# Patient Record
Sex: Female | Born: 1935 | Race: White | Hispanic: No | Smoking: Never smoker
Health system: Southern US, Community
[De-identification: ages and names within clinical notes are randomized; demographics above are authoritative.]

## PROBLEM LIST (undated history)

## (undated) DIAGNOSIS — H409 Unspecified glaucoma: Secondary | ICD-10-CM

## (undated) DIAGNOSIS — K5900 Constipation, unspecified: Secondary | ICD-10-CM

## (undated) DIAGNOSIS — E785 Hyperlipidemia, unspecified: Secondary | ICD-10-CM

## (undated) DIAGNOSIS — I639 Cerebral infarction, unspecified: Secondary | ICD-10-CM

## (undated) DIAGNOSIS — Z8679 Personal history of other diseases of the circulatory system: Secondary | ICD-10-CM

## (undated) DIAGNOSIS — I10 Essential (primary) hypertension: Secondary | ICD-10-CM

## (undated) DIAGNOSIS — K219 Gastro-esophageal reflux disease without esophagitis: Secondary | ICD-10-CM

## (undated) DIAGNOSIS — D649 Anemia, unspecified: Secondary | ICD-10-CM

## (undated) DIAGNOSIS — N390 Urinary tract infection, site not specified: Secondary | ICD-10-CM

## (undated) DIAGNOSIS — R42 Dizziness and giddiness: Secondary | ICD-10-CM

## (undated) HISTORY — DX: Unspecified glaucoma: H40.9

## (undated) HISTORY — DX: Essential (primary) hypertension: I10

## (undated) HISTORY — PX: OTHER SURGICAL HISTORY: SHX169

---

## 2018-01-06 ENCOUNTER — Other Ambulatory Visit: Payer: Self-pay

## 2018-01-06 ENCOUNTER — Emergency Department (HOSPITAL_COMMUNITY): Payer: Medicare Other

## 2018-01-06 ENCOUNTER — Encounter (HOSPITAL_COMMUNITY): Payer: Self-pay | Admitting: Emergency Medicine

## 2018-01-06 ENCOUNTER — Observation Stay (HOSPITAL_COMMUNITY)
Admission: EM | Admit: 2018-01-06 | Discharge: 2018-01-08 | Disposition: A | Discharge status: Home / Self Care | Payer: Medicare Other | Attending: Family Medicine | Admitting: Family Medicine

## 2018-01-06 DIAGNOSIS — Z66 Do not resuscitate: Secondary | ICD-10-CM | POA: Diagnosis not present

## 2018-01-06 DIAGNOSIS — K819 Cholecystitis, unspecified: Secondary | ICD-10-CM | POA: Diagnosis not present

## 2018-01-06 DIAGNOSIS — R339 Retention of urine, unspecified: Secondary | ICD-10-CM | POA: Diagnosis not present

## 2018-01-06 DIAGNOSIS — K811 Chronic cholecystitis: Secondary | ICD-10-CM | POA: Diagnosis not present

## 2018-01-06 DIAGNOSIS — K219 Gastro-esophageal reflux disease without esophagitis: Secondary | ICD-10-CM | POA: Insufficient documentation

## 2018-01-06 DIAGNOSIS — E785 Hyperlipidemia, unspecified: Secondary | ICD-10-CM | POA: Insufficient documentation

## 2018-01-06 DIAGNOSIS — Z87440 Personal history of urinary (tract) infections: Secondary | ICD-10-CM | POA: Insufficient documentation

## 2018-01-06 DIAGNOSIS — K8010 Calculus of gallbladder with chronic cholecystitis without obstruction: Principal | ICD-10-CM | POA: Insufficient documentation

## 2018-01-06 DIAGNOSIS — K5909 Other constipation: Secondary | ICD-10-CM | POA: Diagnosis not present

## 2018-01-06 DIAGNOSIS — R1011 Right upper quadrant pain: Secondary | ICD-10-CM | POA: Diagnosis not present

## 2018-01-06 DIAGNOSIS — I10 Essential (primary) hypertension: Secondary | ICD-10-CM | POA: Insufficient documentation

## 2018-01-06 DIAGNOSIS — E7800 Pure hypercholesterolemia, unspecified: Secondary | ICD-10-CM | POA: Diagnosis not present

## 2018-01-06 DIAGNOSIS — Z79899 Other long term (current) drug therapy: Secondary | ICD-10-CM | POA: Insufficient documentation

## 2018-01-06 DIAGNOSIS — E860 Dehydration: Secondary | ICD-10-CM | POA: Insufficient documentation

## 2018-01-06 DIAGNOSIS — F0390 Unspecified dementia without behavioral disturbance: Secondary | ICD-10-CM | POA: Diagnosis not present

## 2018-01-06 DIAGNOSIS — R42 Dizziness and giddiness: Secondary | ICD-10-CM | POA: Insufficient documentation

## 2018-01-06 DIAGNOSIS — R1084 Generalized abdominal pain: Secondary | ICD-10-CM | POA: Diagnosis not present

## 2018-01-06 DIAGNOSIS — I6523 Occlusion and stenosis of bilateral carotid arteries: Secondary | ICD-10-CM | POA: Diagnosis not present

## 2018-01-06 DIAGNOSIS — R109 Unspecified abdominal pain: Secondary | ICD-10-CM

## 2018-01-06 DIAGNOSIS — K5904 Chronic idiopathic constipation: Secondary | ICD-10-CM | POA: Diagnosis not present

## 2018-01-06 DIAGNOSIS — E559 Vitamin D deficiency, unspecified: Secondary | ICD-10-CM | POA: Diagnosis not present

## 2018-01-06 DIAGNOSIS — K5900 Constipation, unspecified: Secondary | ICD-10-CM

## 2018-01-06 DIAGNOSIS — Z7982 Long term (current) use of aspirin: Secondary | ICD-10-CM | POA: Diagnosis not present

## 2018-01-06 DIAGNOSIS — Z87898 Personal history of other specified conditions: Secondary | ICD-10-CM

## 2018-01-06 HISTORY — DX: Hyperlipidemia, unspecified: E78.5

## 2018-01-06 HISTORY — DX: Personal history of other diseases of the circulatory system: Z86.79

## 2018-01-06 HISTORY — DX: Urinary tract infection, site not specified: N39.0

## 2018-01-06 HISTORY — DX: Gastro-esophageal reflux disease without esophagitis: K21.9

## 2018-01-06 HISTORY — DX: Dizziness and giddiness: R42

## 2018-01-06 HISTORY — DX: Constipation, unspecified: K59.00

## 2018-01-06 LAB — CBC WITH DIFFERENTIAL/PLATELET
Basophils Absolute: 0 10*3/uL (ref 0.0–0.1)
Basophils Relative: 0 %
EOS ABS: 0.5 10*3/uL (ref 0.0–0.7)
EOS PCT: 5 %
HCT: 42.7 % (ref 36.0–46.0)
Hemoglobin: 13.5 g/dL (ref 12.0–15.0)
LYMPHS ABS: 1.8 10*3/uL (ref 0.7–4.0)
Lymphocytes Relative: 22 %
MCH: 28.4 pg (ref 26.0–34.0)
MCHC: 31.6 g/dL (ref 30.0–36.0)
MCV: 89.7 fL (ref 78.0–100.0)
MONO ABS: 1.2 10*3/uL — AB (ref 0.1–1.0)
MONOS PCT: 14 %
Neutro Abs: 4.9 10*3/uL (ref 1.7–7.7)
Neutrophils Relative %: 59 %
PLATELETS: 232 10*3/uL (ref 150–400)
RBC: 4.76 MIL/uL (ref 3.87–5.11)
RDW: 16.5 % — ABNORMAL HIGH (ref 11.5–15.5)
WBC: 8.3 10*3/uL (ref 4.0–10.5)

## 2018-01-06 LAB — COMPREHENSIVE METABOLIC PANEL
ALT: 12 U/L — ABNORMAL LOW (ref 14–54)
AST: 25 U/L (ref 15–41)
Albumin: 3.7 g/dL (ref 3.5–5.0)
Alkaline Phosphatase: 83 U/L (ref 38–126)
Anion gap: 10 (ref 5–15)
BUN: 20 mg/dL (ref 6–20)
CHLORIDE: 104 mmol/L (ref 101–111)
CO2: 27 mmol/L (ref 22–32)
Calcium: 9.3 mg/dL (ref 8.9–10.3)
Creatinine, Ser: 1.53 mg/dL — ABNORMAL HIGH (ref 0.44–1.00)
GFR, EST AFRICAN AMERICAN: 32 mL/min — AB (ref >60)
GFR, EST NON AFRICAN AMERICAN: 28 mL/min — AB (ref >60)
Glucose, Bld: 99 mg/dL (ref 65–99)
POTASSIUM: 4.3 mmol/L (ref 3.5–5.1)
SODIUM: 141 mmol/L (ref 135–145)
Total Bilirubin: 0.4 mg/dL (ref 0.3–1.2)
Total Protein: 7.2 g/dL (ref 6.5–8.1)

## 2018-01-06 LAB — CBC
HEMATOCRIT: 44.5 % (ref 36.0–46.0)
HEMOGLOBIN: 13.7 g/dL (ref 12.0–15.0)
MCH: 28.2 pg (ref 26.0–34.0)
MCHC: 30.8 g/dL (ref 30.0–36.0)
MCV: 91.8 fL (ref 78.0–100.0)
Platelets: 228 10*3/uL (ref 150–400)
RBC: 4.85 MIL/uL (ref 3.87–5.11)
RDW: 16.7 % — ABNORMAL HIGH (ref 11.5–15.5)
WBC: 7.3 10*3/uL (ref 4.0–10.5)

## 2018-01-06 LAB — URINALYSIS, ROUTINE W REFLEX MICROSCOPIC
Bilirubin Urine: NEGATIVE
Glucose, UA: NEGATIVE mg/dL
Ketones, ur: NEGATIVE mg/dL
NITRITE: NEGATIVE
PH: 6 (ref 5.0–8.0)
Protein, ur: 30 mg/dL — AB
SPECIFIC GRAVITY, URINE: 1.014 (ref 1.005–1.030)

## 2018-01-06 LAB — CREATININE, SERUM
Creatinine, Ser: 1.34 mg/dL — ABNORMAL HIGH (ref 0.44–1.00)
GFR calc Af Amer: 38 mL/min — ABNORMAL LOW (ref >60)
GFR calc non Af Amer: 33 mL/min — ABNORMAL LOW (ref >60)

## 2018-01-06 LAB — LIPASE, BLOOD: LIPASE: 24 U/L (ref 11–51)

## 2018-01-06 MED ORDER — ACETAMINOPHEN 650 MG RE SUPP: 650.0000 mg | Freq: Four times a day (QID) | RECTAL | Status: DC | PRN

## 2018-01-06 MED ORDER — PHENOL 1.4 % MT LIQD: 1.0000 | OROMUCOSAL | Status: DC | PRN

## 2018-01-06 MED ORDER — LORATADINE 10 MG PO TABS: 10.0000 mg | ORAL_TABLET | Freq: Every day | ORAL | Status: DC | PRN

## 2018-01-06 MED ORDER — LIP MEDEX EX OINT: 1.0000 "application " | TOPICAL_OINTMENT | CUTANEOUS | Status: DC | PRN

## 2018-01-06 MED ORDER — PANTOPRAZOLE SODIUM 40 MG PO TBEC
40.0000 mg | DELAYED_RELEASE_TABLET | Freq: Every day | ORAL | Status: DC
  Administered 2018-01-07 – 2018-01-08 (×2): 40 mg via ORAL
  Filled 2018-01-06 (×2): qty 1

## 2018-01-06 MED ORDER — MUSCLE RUB 10-15 % EX CREA: 1.0000 "application " | TOPICAL_CREAM | CUTANEOUS | Status: DC | PRN

## 2018-01-06 MED ORDER — ALUM & MAG HYDROXIDE-SIMETH 200-200-20 MG/5ML PO SUSP: 30.0000 mL | ORAL | Status: DC | PRN

## 2018-01-06 MED ORDER — SODIUM CHLORIDE 0.9 % IV SOLN: INTRAVENOUS | Status: DC

## 2018-01-06 MED ORDER — POLYVINYL ALCOHOL 1.4 % OP SOLN: 1.0000 [drp] | OPHTHALMIC | Status: DC | PRN

## 2018-01-06 MED ORDER — HYDRALAZINE HCL 20 MG/ML IJ SOLN
10.0000 mg | INTRAMUSCULAR | Status: DC | PRN
  Administered 2018-01-06: 10 mg via INTRAVENOUS
  Filled 2018-01-06: qty 1

## 2018-01-06 MED ORDER — SODIUM CHLORIDE 0.9 % IV SOLN
INTRAVENOUS | Status: DC
  Administered 2018-01-06 – 2018-01-07 (×2): via INTRAVENOUS

## 2018-01-06 MED ORDER — SALINE SPRAY 0.65 % NA SOLN: 1.0000 | NASAL | Status: DC | PRN

## 2018-01-06 MED ORDER — SENNOSIDES-DOCUSATE SODIUM 8.6-50 MG PO TABS: 1.0000 | ORAL_TABLET | Freq: Every evening | ORAL | Status: DC | PRN

## 2018-01-06 MED ORDER — HYDROCORTISONE 2.5 % RE CREA: 1.0000 "application " | TOPICAL_CREAM | Freq: Four times a day (QID) | RECTAL | Status: DC | PRN

## 2018-01-06 MED ORDER — LABETALOL HCL 5 MG/ML IV SOLN
10.0000 mg | INTRAVENOUS | Status: DC | PRN
  Filled 2018-01-06: qty 4

## 2018-01-06 MED ORDER — DOCUSATE SODIUM 100 MG PO CAPS: 100.0000 mg | ORAL_CAPSULE | Freq: Two times a day (BID) | ORAL | Status: DC | PRN

## 2018-01-06 MED ORDER — ENOXAPARIN SODIUM 30 MG/0.3ML ~~LOC~~ SOLN
30.0000 mg | SUBCUTANEOUS | Status: DC
  Administered 2018-01-06 – 2018-01-07 (×2): 30 mg via SUBCUTANEOUS
  Filled 2018-01-06 (×2): qty 0.3

## 2018-01-06 MED ORDER — ROSUVASTATIN CALCIUM 10 MG PO TABS
10.0000 mg | ORAL_TABLET | Freq: Every day | ORAL | Status: DC
  Administered 2018-01-07 – 2018-01-08 (×2): 10 mg via ORAL
  Filled 2018-01-06 (×2): qty 1

## 2018-01-06 MED ORDER — ASPIRIN EC 81 MG PO TBEC
81.0000 mg | DELAYED_RELEASE_TABLET | Freq: Every day | ORAL | Status: DC
  Administered 2018-01-07 – 2018-01-08 (×2): 81 mg via ORAL
  Filled 2018-01-06 (×2): qty 1

## 2018-01-06 MED ORDER — GUAIFENESIN-DM 100-10 MG/5ML PO SYRP: 5.0000 mL | ORAL_SOLUTION | ORAL | Status: DC | PRN

## 2018-01-06 MED ORDER — ACETAMINOPHEN 325 MG PO TABS: 650.0000 mg | ORAL_TABLET | Freq: Four times a day (QID) | ORAL | Status: DC | PRN

## 2018-01-06 MED ORDER — IRBESARTAN 75 MG PO TABS
37.5000 mg | ORAL_TABLET | Freq: Every day | ORAL | Status: DC
  Administered 2018-01-06 – 2018-01-07 (×2): 37.5 mg via ORAL
  Filled 2018-01-06 (×2): qty 1

## 2018-01-06 MED ORDER — HYDROCORTISONE 1 % EX CREA: 1.0000 "application " | TOPICAL_CREAM | Freq: Three times a day (TID) | CUTANEOUS | Status: DC | PRN

## 2018-01-06 NOTE — ED Notes (Signed)
Pt has has an assigned Bed x 1.5 hours no purple icon has been made, floor  called to find out the delay.

## 2018-01-06 NOTE — ED Notes (Signed)
Called reports to [facility] 5th floor

## 2018-01-06 NOTE — ED Notes (Signed)
Patient transported to Ultrasound 

## 2018-01-06 NOTE — Consult Note (Signed)
Reason for Consult: Chronic cholecystitis Referring Physician: Dr. Robyn Haber PCP:  Suzie Portela, MD  [facility]: Dr. Dorena Dew -[phone]   Donna Summers is an 84 y.o. female.   HPI: 84 year old female who lives in assisted living outside of town with dementia.  Son reports numerous visits for UTI'S; she has a foley in still.  Two rounds of antibiotics (nitrofuantoin is the last antibiotic).   She has a history of constipation for years and was treated for this recently/SBO;  discharged on MiraLAX.  She returned with about 2 weeks of abdominal discomfort and some nausea.  She was found to have and gallstones with reported bile duct blockage. Surgeons there reported she may need and MRCP.   She lives and was in [facility], this area  has limited resources, her son has brought her here to help sort things out.    Patient reported abdominal pain for the last 11 days.  Pain appears to be intermittent, she cannot tell if it is associated with PO intake.  Her PO intake has been very limited to just a few bites per meal.  Some nausea but she varies her story so it is hard to tell.  .  She has had some fever has been on antibiotics for a low-grade UTI, foley is still in and she has a depends garment in place..    Patient's son is a friend of Dr. Olin Pia and was brought her to [facility] for evaluation and treatment.``  Workup in our ED here: She is afebrile vital signs are stable.  Creatinine is 1.53, lipase is 24, AST  25,  ALT 12,  bilirubin 0.4.  WBC is 8.3 hemoglobin 13.5, hematocrit 42.7.  Platelets 232,000. UA shows WBC TNTC, nitrates are negative.   Abdominal ultrasound shows multiple gallstones in the gallbladder the largest measuring 1.1 cm, diffuse gallbladder wall thickening maximal thickness 3.4 cm, no pericholecystic fluid no sonographic Murphy sign.  Common bile duct is 6.9 mm.  Consistent with cholelithiasis and diffuse gallbladder wall thickening  is most likely due to chronic cholecystitis.  No biliary area ductal dilatation;  diffuse bilateral renal cortical atrophy.  We are asked to see. Chest x-ray shows no acute pulmonary disease.  No evidence of bowel dilatation or free intraperitoneal air.  Past Medical History:  Diagnosis Date  . Total carotid stenosis with TIA ? Left; severe right carotid disease   . Dementia - Assisted living - still walks some in the home   . GERD (gastroesophageal reflux disease)   . constipation   . Hyperlipidemia   . UTI (urinary tract infection)   . Vertigo    Her son reports prior hysterectomy - ? 40 years ago With SBO 35 years ago reexploration and Lysis of adhesions.  I cannot currently get into Care Everywhere so I cannot add more hx  The histories are not reviewed yet. Please review them in the "History" navigator section and refresh this [place].  No family history on file.  Social History:  reports that she has never smoked. She does not have any smokeless tobacco history on file. She reports that she drank alcohol. Her drug history is not on file.   Tobacco:  None ETOH:  None DRUGS: none Lives in assisted living with some independent ambulation.   Allergies:  Allergies  Allergen Reactions  . Bactrim [Sulfamethoxazole-Trimethoprim] Other (See Comments)    unknown  . Levaquin [Levofloxacin] Other (See Comments)    unknown  Prior to Admission medications   Medication Sig Start Date End Date Taking? Authorizing Provider  acetaminophen (TYLENOL) 325 MG tablet Take 650 mg by mouth every 6 (six) hours as needed for mild pain or moderate pain.   Yes [provider]  aspirin EC 81 MG tablet Take 81 mg by mouth daily.   Yes [provider]  bisacodyl (DULCOLAX) 10 MG suppository Place 10 mg rectally 3 (three) times daily as needed for mild constipation, moderate constipation or severe constipation.   Yes [provider]  [facility] OP Apply 1  drop to eye 3 (three) times daily. LEFT EYE   Yes [provider]  calcium carbonate (TUMS - DOSED IN MG ELEMENTAL CALCIUM) 500 MG chewable tablet Chew 2 tablets by mouth 3 (three) times daily as needed for indigestion.   Yes [provider]  cholecalciferol (VITAMIN D) 1000 units tablet Take 1,000 Units by mouth daily.   Yes [provider]  docusate sodium (COLACE) 100 MG capsule Take 100 mg by mouth 2 (two) times daily as needed for mild constipation.   Yes [provider]  irbesartan (AVAPRO) 75 MG tablet Take 37.5 mg by mouth at bedtime. TAKES 1/2 TABLET   Yes [provider]  lactulose (CHRONULAC) 10 GM/15ML solution Take 10 g by mouth daily.   Yes [provider]  meclizine (ANTIVERT) 12.5 MG tablet Take 12.5 mg by mouth 3 (three) times daily as needed for dizziness.   Yes [provider]  nitrofurantoin (MACRODANTIN) 100 MG capsule Take 100 mg by mouth 2 (two) times daily. STARTED 5-DAY THERAPY ON 01-02-18   Yes [provider]  pantoprazole (PROTONIX) 40 MG tablet Take 40 mg by mouth daily.   Yes [provider]  polyethylene glycol (MIRALAX / GLYCOLAX) packet Take 17 g by mouth daily as needed for mild constipation, moderate constipation or severe constipation.   Yes [provider]  rosuvastatin (CRESTOR) 10 MG tablet Take 10 mg by mouth daily.   Yes [provider]  sucralfate (CARAFATE) 1 g tablet Take 1 g by mouth 2 (two) times daily.   Yes [provider]  travoprost, benzalkonium, (TRAVATAN) 0.004 % ophthalmic solution Place 1 drop into the left eye at bedtime.   Yes [provider]     Results for orders placed or performed during the hospital encounter of 01/06/18 (from the past 48 hour(s))  Lipase, blood     Status: None   Collection Time: 01/06/18 12:31 PM  Result Value Ref Range   Lipase 24 11 - 51 U/L    Comment: Performed at [facility],  [place] [address], [place], [place] [address]  Comprehensive metabolic panel     Status: Abnormal   Collection Time: 01/06/18 12:31 PM  Result Value Ref Range   Sodium 141 135 - 145 mmol/L   Potassium 4.3 3.5 - 5.1 mmol/L   Chloride 104 101 - 111 mmol/L   CO2 27 22 - 32 mmol/L   Glucose, Bld 99 65 - 99 mg/dL   BUN 20 6 - 20 mg/dL   Creatinine, Ser 1.53 (H) 0.44 - 1.00 mg/dL   Calcium 9.3 8.9 - 10.3 mg/dL   Total Protein 7.2 6.5 - 8.1 g/dL   Albumin 3.7 3.5 - 5.0 g/dL   AST 25 15 - 41 U/L   ALT 12 (L) 14 - 54 U/L   Alkaline Phosphatase 83 38 - 126 U/L   Total Bilirubin 0.4 0.3 - 1.2  mg/dL   GFR calc non Af Amer 28 (L) >60 mL/min   GFR calc Af Amer 32 (L) >60 mL/min    Comment: (NOTE) The eGFR has been calculated using the CKD EPI equation. This calculation has not been validated in all clinical situations. eGFR's persistently <60 mL/min signify possible Chronic Kidney Disease.    Anion gap 10 5 - 15    Comment: Performed at [facility], [place] [address] [place], [place] [address]  Urinalysis, Routine w reflex microscopic     Status: Abnormal   Collection Time: 01/06/18 12:31 PM  Result Value Ref Range   Color, Urine AMBER (A) YELLOW    Comment: BIOCHEMICALS MAY BE AFFECTED BY COLOR   APPearance HAZY (A) CLEAR   Specific Gravity, Urine 1.014 1.005 - 1.030   pH 6.0 5.0 - 8.0   Glucose, UA NEGATIVE NEGATIVE mg/dL   Hgb urine dipstick LARGE (A) NEGATIVE   Bilirubin Urine NEGATIVE NEGATIVE   Ketones, ur NEGATIVE NEGATIVE mg/dL   Protein, ur 30 (A) NEGATIVE mg/dL   Nitrite NEGATIVE NEGATIVE   Leukocytes, UA LARGE (A) NEGATIVE   RBC / HPF 6-30 0 - 5 RBC/hpf   WBC, UA TOO NUMEROUS TO COUNT 0 - 5 WBC/hpf   Bacteria, UA FEW (A) NONE SEEN   Squamous Epithelial / LPF 0-5 (A) NONE SEEN   WBC Clumps PRESENT    Mucus PRESENT    Hyaline Casts, UA PRESENT     Comment: Performed at [facility], [place] [address], [place], [place] [address]  CBC with  Differential/Platelet     Status: Abnormal   Collection Time: 01/06/18  1:14 PM  Result Value Ref Range   WBC 8.3 4.0 - 10.5 K/uL   RBC 4.76 3.87 - 5.11 MIL/uL   Hemoglobin 13.5 12.0 - 15.0 g/dL   HCT 42.7 36.0 - 46.0 %   MCV 89.7 78.0 - 100.0 fL   MCH 28.4 26.0 - 34.0 pg   MCHC 31.6 30.0 - 36.0 g/dL   RDW 16.5 (H) 11.5 - 15.5 %   Platelets 232 150 - 400 K/uL   Neutrophils Relative % 59 %   Neutro Abs 4.9 1.7 - 7.7 K/uL   Lymphocytes Relative 22 %   Lymphs Abs 1.8 0.7 - 4.0 K/uL   Monocytes Relative 14 %   Monocytes Absolute 1.2 (H) 0.1 - 1.0 K/uL   Eosinophils Relative 5 %   Eosinophils Absolute 0.5 0.0 - 0.7 K/uL   Basophils Relative 0 %   Basophils Absolute 0.0 0.0 - 0.1 K/uL    Comment: Performed at [facility], [place] [address], [place],  [address]    US Abdomen Complete  Result Date: 01/06/2018 CLINICAL DATA:  Right upper quadrant abdominal pain for the past week. Nausea. EXAM: ABDOMEN ULTRASOUND COMPLETE COMPARISON:  None. FINDINGS: Gallbladder: Multiple gallstones in the gallbladder, the largest measuring 1.1 cm in maximum diameter. Mild diffuse gallbladder wall thickening with a maximum thickness of 3.4 mm. No pericholecystic fluid. No sonographic Murphy sign. Common bile duct: Diameter: 6.9 mm Liver: No focal lesion identified. Within normal limits in parenchymal echogenicity. Portal vein is patent on color Doppler imaging with normal direction of blood flow towards the liver. IVC: No abnormality visualized. Pancreas: Visualized portion unremarkable. Spleen: Size and appearance within normal limits. Right Kidney: Length: 8.7 cm. Diffuse cortical thinning. Prominent renal sinus fat. Normal echotexture. No hydronephrosis. Left Kidney: Length: 8.6 cm. Diffuse cortical thinning. Prominent renal sinus fat.  Normal echotexture. No hydronephrosis. Abdominal aorta: No aneurysm visualized. Other findings: None. IMPRESSION: 1. Cholelithiasis. 2. Mild diffuse  gallbladder wall thickening. This is most likely due to chronic cholecystitis. 3. No biliary ductal dilatation. 4. Diffuse bilateral renal cortical atrophy. Electronically Signed   By: Claudie Revering M.D.   On: 01/06/2018 12:22   Dg Abdomen Acute W/chest  Result Date: 01/06/2018 CLINICAL DATA:  Abdominal pain.  Gallstones. EXAM: DG ABDOMEN ACUTE W/ 1V CHEST COMPARISON:  None. FINDINGS: There is no evidence of dilated bowel loops or free intraperitoneal air. No radiopaque calculi or other significant radiographic abnormality is seen. Heart size and mediastinal contours are within normal limits. Both lungs are clear. Vascular calcification of the aorta. IMPRESSION: Negative abdominal radiographs.  No acute cardiopulmonary disease. Electronically Signed   By: Staci Righter M.D.   On: 01/06/2018 13:20    Review of Systems  Constitutional: Positive for fever (99 range). Negative for chills, malaise/fatigue and weight loss.  HENT: Negative.   Eyes: Negative.   Respiratory: Negative.   Cardiovascular: Negative.   Gastrointestinal: Positive for abdominal pain (she points to both upper left and right sides for pain.), constipation, heartburn and nausea (she varies on this answer depending when you ask). Negative for vomiting.  Genitourinary:       Foley and depends in place.  Musculoskeletal:       She walks some at the Assisted living.    Skin: Negative.   Neurological: Negative.   Endo/Heme/Allergies: Negative.   Psychiatric/Behavioral:       She has dementia and can answer some questions, but can't on others.     Blood pressure (!) 118/52, pulse 64, temperature 98.5 F (36.9 C), temperature source Oral, resp. rate 18, height 5' 3"  (1.6 m), weight 68 kg (150 lb), SpO2 96 %. Physical Exam  Constitutional: She appears well-developed and well-nourished. No distress.  HENT:  Head: Normocephalic and atraumatic.  Mouth/Throat: Oropharynx is clear and moist. No oropharyngeal exudate.  Eyes: Right eye  exhibits no discharge.  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    Neck: Normal range of motion. Neck supple. No tracheal deviation present. No thyromegaly present.  I don't feel the carotid on the left, but do feel it on the right, I did not hear a bruit on either side  Cardiovascular: Normal rate, regular rhythm and normal heart sounds.  No murmur heard. Palpable pulse on the left none on the right.  Respiratory: Effort normal and breath sounds normal. No stridor. No respiratory distress. She has no wheezes. She has no rales. She exhibits no tenderness.  GI: Soft. Bowel sounds are normal. She exhibits no distension and no mass. There is tenderness (she was really tender to a degree in all 4 quadrants.  she could not tell me one was more tender than the other. ). There is no rebound and no guarding.  Well healed midline surgical scar.  Lymphadenopathy:    She has no cervical adenopathy.  Skin: Skin is warm and dry. No rash noted. She is not diaphoretic. No erythema. No pallor.  Psychiatric: She has a normal mood and affect. Her behavior is normal. Judgment and thought content normal.    Assessment/Plan: Abdominal pain, and some nausea Cholelithiasis; possible chronic cholecystitis Recurrent UTI - foley in place and at least the second round of abx. PDA with one occluded ICA, one partially occluded  Dementia Chronic constipation GERD Hyperlipidemia  Hx of vertigo  Plan:  She does not have acute cholecystitis, she has normal WBC, normal lipase, normal LFT's. She does have thickened GB wall, gallstones and dilated CBD (?age related) without evidence of obstruction.  Possible chronic cholecystitis.   I would get a urine culture on her, and try antibiotics to see if her sx improve.  She would need Medical clearance for surgery.  I would include carotids before we risk putting her to  sleep.  She is not acutely ill and I would thoroughly review everything and be sure we have eliminated all other options before offering her surgery.    Donna Summers 01/06/2018, 1:59 PM

## 2018-01-06 NOTE — ED Triage Notes (Signed)
Per son, states she lives in an assisted living facility out of town-states numerous ED visits 1-UTI-on second round of antibiotics and, foley placed 10 days ago 2-ED visit for abdominal pain-states she was told she had a SBO-discharged on miralax 3-returned to ED again for abdominal pain, diagnosed with gallstones and bile duct blockage-was referred to surgeon-family brought her here for work up due to minimal resources where she lives

## 2018-01-06 NOTE — ED Provider Notes (Signed)
[facility]-EMERGENCY DEPT Provider Note   CSN: [id number] Arrival date & time: 01/06/18  1014     History   Chief Complaint Chief Complaint  Patient presents with  . Abdominal Pain  . Recurrent UTI    HPI Donna Summers is a 84 y.o. female. Level 5 caveat due to dementia.  Most history came from patient's son. HPI  Patient presents with abdominal pain.  She has had it for the last 11 days.  It is constant.  Has been seen twice in an outside ER and reportedly had 2 separate CT scans.  First 1 reportedly showed constipation of the second 1 reportedly showed gallstones.  Has also had a fever that is reportedly been low-grade.  Has been on antibiotics for urinary tract infection and has a Foley catheter in for urinary retention.  Has followed up with general surgery.  Was told that she may need an MRCP.  Patient [place] in [place] part [place] the [place] was brought here for higher level of care.  Patient's son is a friend of Dr. Graciela Husbands from cardiology. Patient's son brought information on where she was treated but does not have any of the actual records.  The abdominal pain is been dull.  Has had constipation but has had a improvement in her bowel movements after taking magnesium citrate 3 times.        Past Medical History:  Diagnosis Date  . Constipation   . Constipation   . GERD (gastroesophageal reflux disease)   . History of carotid artery disease   . Hyperlipidemia   . UTI (urinary tract infection)   . Vertigo     There are no active problems to display for this patient.     OB History   None      Home Medications    Prior to Admission medications   Medication Sig Start Date End Date Taking? Authorizing Provider  acetaminophen (TYLENOL) 325 MG tablet Take 650 mg by mouth every 6 (six) hours as needed for mild pain or moderate pain.   Yes [provider]  aspirin EC 81 MG tablet Take 81 mg by mouth daily.   Yes [provider]    bisacodyl (DULCOLAX) 10 MG suppository Place 10 mg rectally 3 (three) times daily as needed for mild constipation, moderate constipation or severe constipation.   Yes [provider]  [facility] OP Apply 1 drop to eye 3 (three) times daily. LEFT EYE   Yes [provider]  calcium carbonate (TUMS - DOSED IN MG ELEMENTAL CALCIUM) 500 MG chewable tablet Chew 2 tablets by mouth 3 (three) times daily as needed for indigestion.   Yes [provider]  cholecalciferol (VITAMIN D) 1000 units tablet Take 1,000 Units by mouth daily.   Yes [provider]  docusate sodium (COLACE) 100 MG capsule Take 100 mg by mouth 2 (two) times daily as needed for mild constipation.   Yes [provider]  irbesartan (AVAPRO) 75 MG tablet Take 37.5 mg by mouth at bedtime. TAKES 1/2 TABLET   Yes [provider]  lactulose (CHRONULAC) 10 GM/15ML solution Take 10 g by mouth daily.   Yes [provider]  meclizine (ANTIVERT) 12.5 MG tablet Take 12.5 mg by mouth 3 (three) times daily as needed for dizziness.   Yes [provider]  nitrofurantoin (MACRODANTIN) 100 MG capsule Take 100 mg by mouth 2 (two) times daily. STARTED 5-DAY THERAPY ON 01-02-18   Yes [provider]  pantoprazole (PROTONIX) 40 MG tablet Take 40 mg by mouth daily.   Yes [provider]  polyethylene glycol (MIRALAX / GLYCOLAX) packet Take 17 g by mouth daily as needed for mild constipation, moderate constipation or severe constipation.   Yes [provider]  rosuvastatin (CRESTOR) 10 MG tablet Take 10 mg by mouth daily.   Yes [provider]  sucralfate (CARAFATE) 1 g tablet Take 1 g by mouth 2 (two) times daily.   Yes [provider]  travoprost, benzalkonium, (TRAVATAN) 0.004 % ophthalmic solution Place 1 drop into the left eye at bedtime.   Yes [provider]    Family History No family history on file.  Social  History Social History   Tobacco Use  . Smoking status: Never Smoker  Substance Use Topics  . Alcohol use: Not Currently    Frequency: Never  . Drug use: Not on file     Allergies   Bactrim [sulfamethoxazole-trimethoprim] and Levaquin [levofloxacin]   Review of Systems Review of Systems  Unable to perform ROS: Dementia     Physical Exam Updated Vital Signs BP (!) 118/52 (BP Location: Left Arm)   Pulse 64   Temp 98.5 F (36.9 C) (Oral)   Resp 18   Ht 5\' 3"  (1.6 m)   Wt 68 kg (150 lb)   SpO2 96%   BMI 26.57 kg/m   Physical Exam  Constitutional: She appears well-developed.  HENT:  Head: Atraumatic.  Cardiovascular: Regular rhythm.  Pulmonary/Chest: Breath sounds normal.  Abdominal: No hernia.  Mild distention.  No hernia palpated.  Upper abdominal tenderness without rebound or guarding.  Genitourinary:  Genitourinary Comments: Foley catheter in place  Neurological: She is alert.  Awake and pleasant but has some mild confusion.  Skin: Skin is warm. Capillary refill takes less than 2 seconds.     ED Treatments / Results  Labs (all labs ordered are listed, but only abnormal results are displayed) Labs Reviewed  COMPREHENSIVE METABOLIC PANEL - Abnormal; Notable for the following components:      Result Value   Creatinine, Ser 1.53 (*)    ALT 12 (*)    GFR calc non Af Amer 28 (*)    GFR calc Af Amer 32 (*)    All other components within normal limits  URINALYSIS, ROUTINE W REFLEX MICROSCOPIC - Abnormal; Notable for the following components:   Color, Urine AMBER (*)    APPearance HAZY (*)    Hgb urine dipstick LARGE (*)    Protein, ur 30 (*)    Leukocytes, UA LARGE (*)    Bacteria, UA FEW (*)    Squamous Epithelial / LPF 0-5 (*)    All other components within normal limits  CBC WITH DIFFERENTIAL/PLATELET - Abnormal; Notable for the following components:   RDW 16.5 (*)    Monocytes Absolute 1.2 (*)    All other components within normal limits  LIPASE,  BLOOD    EKG  EKG Interpretation None       Radiology US Abdomen Complete  Result Date: 01/06/2018 CLINICAL DATA:  Right upper quadrant abdominal pain for the past week. Nausea. EXAM: ABDOMEN ULTRASOUND COMPLETE COMPARISON:  None. FINDINGS: Gallbladder: Multiple gallstones in the gallbladder, the largest measuring 1.1 cm in maximum diameter. Mild diffuse gallbladder wall thickening with a maximum thickness of 3.4 mm. No pericholecystic fluid. No sonographic Murphy sign. Common bile duct: Diameter: 6.9 mm Liver: No focal lesion identified. Within normal limits in parenchymal echogenicity. Portal vein is patent  on color Doppler imaging with normal direction of blood flow towards the liver. IVC: No abnormality visualized. Pancreas: Visualized portion unremarkable. Spleen: Size and appearance within normal limits. Right Kidney: Length: 8.7 cm. Diffuse cortical thinning. Prominent renal sinus fat. Normal echotexture. No hydronephrosis. Left Kidney: Length: 8.6 cm. Diffuse cortical thinning. Prominent renal sinus fat. Normal echotexture. No hydronephrosis. Abdominal aorta: No aneurysm visualized. Other findings: None. IMPRESSION: 1. Cholelithiasis. 2. Mild diffuse gallbladder wall thickening. This is most likely due to chronic cholecystitis. 3. No biliary ductal dilatation. 4. Diffuse bilateral renal cortical atrophy. Electronically Signed   By: Beckie SaltsSteven  Reid M.D.   On: 01/06/2018 12:22   Dg Abdomen Acute W/chest  Result Date: 01/06/2018 CLINICAL DATA:  Abdominal pain.  Gallstones. EXAM: DG ABDOMEN ACUTE W/ 1V CHEST COMPARISON:  None. FINDINGS: There is no evidence of dilated bowel loops or free intraperitoneal air. No radiopaque calculi or other significant radiographic abnormality is seen. Heart size and mediastinal contours are within normal limits. Both lungs are clear. Vascular calcification of the aorta. IMPRESSION: Negative abdominal radiographs.  No acute cardiopulmonary disease. Electronically  Signed   By: Elsie StainJohn T Curnes M.D.   On: 01/06/2018 13:20    Procedures Procedures (including critical care time)  Medications Ordered in ED Medications - No data to display   Initial Impression / Assessment and Plan / ED Course  I have reviewed the triage vital signs and the nursing notes.  Pertinent labs & imaging results that were available during my care of the patient were reviewed by me and considered in my medical decision making (see chart for details).     Patient presents with upper abdominal pain.  Has had reportedly [facility] but did not have access to the results.  Lab work here are reassuring.  Questionable urinary tract infection.  Ultrasound showed possible chronic cholecystitis.  Lab work reassuring.  However with age and comorbidities will admit to hospitalist.  Have consult with general surgery, who will also see the patient.  Final Clinical Impressions(s) / ED Diagnoses   Final diagnoses:  Right upper quadrant abdominal pain  Chronic cholecystitis    ED Discharge Orders    None       Benjiman CorePickering, Renny Gunnarson, MD 01/06/18 1357

## 2018-01-06 NOTE — H&P (Signed)
History and Physical    Donna Summers MVH:[id number] DOB: April 22, 1920 DOA: 01/06/2018  PCP: Reesa Chew, MD Patient coming from: Home  Chief Complaint: Abdominal pain  HPI: Donna Summers is a 84 y.o. female with medical history significant of essential hypertension, hyperlipidemia, GERD came to the hospital for evaluation of chronic abdominal pain.  Patient [place] area in [place] at an assisted living where she has several different doctors who were not affiliated with one another therefore thinks her care is not very well coordinated.  Overall she is relatively very healthy and gets urinary tract infection maybe once or twice a year but about 3 weeks ago she started experiencing generalized abdominal pain and poor oral intake.  Denied any fevers, chills, diarrhea.  Occasionally has episodes of constipation and due to abdominal pain had gone to see her physician who did an abdominal CT showing constipation.  She was given bowel regimen which helped her but abdominal pain returned.  She returned back to the ER and was diagnosed with UTI but also showed she has some gallstones therefore was referred to general surgeon.  She was seen by general surgeon about 4 days ago and thought patient would need an MRCP but was not available at their facility therefore was asked to get an outpatient.  Patient went back to her PCP yesterday who recommended the same thing but stated patient would benefit going somewhere where all the physicians are available at one place therefore came here with recommendations of 1 of the friends. During this time patient has also developed urinary retention therefore Foley catheter was placed about a week ago.  Patient does not have any other complaints besides poor oral intake and generalized abdominal discomfort not necessarily associated with food.  In the ER today her initial labs including LFTs and lipase were pretty much unremarkable.  She clinically  appeared little dehydrated.  She had a Foley in place and UA was suggestive of urinary tract infection which was thought to be chronic.  Right upper quadrant ultrasound was suggestive of chronic cholecystitis therefore general surgery was consulted who recommended medical admission and will evaluate the patient.  At the time of my evaluation  patient denied any new complaints.  Review of Systems: As per HPI otherwise 10 point review of systems negative.  Review of Systems Otherwise negative except as per HPI, including: General: Denies fever, chills, night sweats or unintended weight loss. Resp: Denies cough, wheezing, shortness of breath. Cardiac: Denies chest pain, palpitations, orthopnea, paroxysmal nocturnal dyspnea. GI: Denies vomiting, diarrhea or constipation GU: Denies dysuria, frequency, hesitancy or incontinence MS: Denies muscle aches, joint pain or swelling Neuro: Denies headache, neurologic deficits (focal weakness, numbness, tingling), abnormal gait Psych: Denies anxiety, depression, SI/HI/AVH Skin: Denies new rashes or lesions ID: Denies sick contacts, exotic exposures, travel  Past Medical History:  Diagnosis Date  . Constipation   . Constipation   . GERD (gastroesophageal reflux disease)   . History of carotid artery disease   . Hyperlipidemia   . UTI (urinary tract infection)   . Vertigo     History reviewed. No pertinent surgical history.   reports that she has never smoked. She has never used smokeless tobacco. She reports that she drank alcohol. Her drug history is not on file.  Allergies  Allergen Reactions  . Bactrim [Sulfamethoxazole-Trimethoprim] Other (See Comments)    unknown  . Levaquin [Levofloxacin] Other (See Comments)    unknown    Patient states her mother  had a history of hypertension  Prior to Admission medications   Medication Sig Start Date End Date Taking? Authorizing Provider  acetaminophen (TYLENOL) 325 MG tablet Take 650 mg by  mouth every 6 (six) hours as needed for mild pain or moderate pain.   Yes [provider]  aspirin EC 81 MG tablet Take 81 mg by mouth daily.   Yes [provider]  bisacodyl (DULCOLAX) 10 MG suppository Place 10 mg rectally 3 (three) times daily as needed for mild constipation, moderate constipation or severe constipation.   Yes [provider]  [facility] Apply 1 drop to eye 3 (three) times daily. LEFT EYE   Yes [provider]  calcium carbonate (TUMS - DOSED IN MG ELEMENTAL CALCIUM) 500 MG chewable tablet Chew 2 tablets by mouth 3 (three) times daily as needed for indigestion.   Yes [provider]  cholecalciferol (VITAMIN D) 1000 units tablet Take 1,000 Units by mouth daily.   Yes [provider]  docusate sodium (COLACE) 100 MG capsule Take 100 mg by mouth 2 (two) times daily as needed for mild constipation.   Yes [provider]  irbesartan (AVAPRO) 75 MG tablet Take 37.5 mg by mouth at bedtime. TAKES 1/2 TABLET   Yes [provider]  lactulose (CHRONULAC) 10 GM/15ML solution Take 10 g by mouth daily.   Yes [provider]  meclizine (ANTIVERT) 12.5 MG tablet Take 12.5 mg by mouth 3 (three) times daily as needed for dizziness.   Yes [provider]  nitrofurantoin (MACRODANTIN) 100 MG capsule Take 100 mg by mouth 2 (two) times daily. STARTED 5-DAY THERAPY ON 01-02-18   Yes [provider]  pantoprazole (PROTONIX) 40 MG tablet Take 40 mg by mouth daily.   Yes [provider]  polyethylene glycol (MIRALAX / GLYCOLAX) packet Take 17 g by mouth daily as needed for mild constipation, moderate constipation or severe constipation.   Yes [provider]  rosuvastatin (CRESTOR) 10 MG tablet Take 10 mg by mouth daily.   Yes [provider]  sucralfate (CARAFATE) 1 g tablet Take 1 g by mouth 2 (two) times daily.   Yes [provider]  travoprost,  benzalkonium, (TRAVATAN) 0.004 % ophthalmic solution Place 1 drop into the left eye at bedtime.   Yes [provider]    Physical Exam: Vitals:   01/06/18 1042 01/06/18 1205 01/06/18 1206 01/06/18 1432  BP: (!) 118/52     Pulse: 75 64    Resp: 16 18    Temp: 98.4 F (36.9 C) 98.5 F (36.9 C)  97.9 F (36.6 C)  TempSrc: Oral Oral  Oral  SpO2: 96% 96%    Weight:   68 kg (150 lb)   Height:   5\' 3"  (1.6 m)       Constitutional: NAD, calm, comfortable, very pleasant, elderly female Vitals:   01/06/18 1042 01/06/18 1205 01/06/18 1206 01/06/18 1432  BP: (!) 118/52     Pulse: 75 64    Resp: 16 18    Temp: 98.4 F (36.9 C) 98.5 F (36.9 C)  97.9 F (36.6 C)  TempSrc: Oral Oral  Oral  SpO2: 96% 96%    Weight:   68 kg (150 lb)   Height:   5\' 3"  (1.6 m)    Eyes: PERRL, lids and conjunctivae normal ENMT: Mucous membranes are dry. Posterior pharynx clear of any exudate or lesions.Normal dentition.  Neck: normal, supple, no masses, no thyromegaly Respiratory: clear to  auscultation bilaterally, no wheezing, no crackles. Normal respiratory effort. No accessory muscle use.  Cardiovascular: Regular rate and rhythm, no murmurs / rubs / gallops. No extremity edema. 2+ pedal pulses. No carotid bruits.  Abdomen: no tenderness, no masses palpated. No hepatosplenomegaly. Bowel sounds positive.  Musculoskeletal: no clubbing / cyanosis. No joint deformity upper and lower extremities. Good ROM, no contractures. Normal muscle tone.  Skin: no rashes, lesions, ulcers. No induration Neurologic: CN 2-12 grossly intact. Sensation intact, DTR normal. Strength 5/5 in all 4.  Psychiatric: Normal judgment and insight. Alert and oriented x 3. Normal mood.  Foley catheter in place   Labs on Admission: I have personally reviewed following labs and imaging studies  CBC: Recent Labs  Lab 01/06/18 1314  WBC 8.3  NEUTROABS 4.9  HGB 13.5  HCT 42.7  MCV 89.7  PLT 232   Basic Metabolic  Panel: Recent Labs  Lab 01/06/18 1231  NA 141  K 4.3  CL 104  CO2 27  GLUCOSE 99  BUN 20  CREATININE 1.53*  CALCIUM 9.3   GFR: Estimated Creatinine Clearance: 20.3 mL/min (A) (by C-G formula based on SCr of 1.53 mg/dL (H)). Liver Function Tests: Recent Labs  Lab 01/06/18 1231  AST 25  ALT 12*  ALKPHOS 83  BILITOT 0.4  PROT 7.2  ALBUMIN 3.7   Recent Labs  Lab 01/06/18 1231  LIPASE 24   No results for input(s): AMMONIA in the last 168 hours. Coagulation Profile: No results for input(s): INR, PROTIME in the last 168 hours. Cardiac Enzymes: No results for input(s): CKTOTAL, CKMB, CKMBINDEX, TROPONINI in the last 168 hours. BNP (last 3 results) No results for input(s): PROBNP in the last 8760 hours. HbA1C: No results for input(s): HGBA1C in the last 72 hours. CBG: No results for input(s): GLUCAP in the last 168 hours. Lipid Profile: No results for input(s): CHOL, HDL, LDLCALC, TRIG, CHOLHDL, LDLDIRECT in the last 72 hours. Thyroid Function Tests: No results for input(s): TSH, T4TOTAL, FREET4, T3FREE, THYROIDAB in the last 72 hours. Anemia Panel: No results for input(s): VITAMINB12, FOLATE, FERRITIN, TIBC, IRON, RETICCTPCT in the last 72 hours. Urine analysis:    Component Value Date/Time   COLORURINE AMBER (A) 01/06/2018 1231   APPEARANCEUR HAZY (A) 01/06/2018 1231   LABSPEC 1.014 01/06/2018 1231   PHURINE 6.0 01/06/2018 1231   GLUCOSEU NEGATIVE 01/06/2018 1231   HGBUR LARGE (A) 01/06/2018 1231   BILIRUBINUR NEGATIVE 01/06/2018 1231   KETONESUR NEGATIVE 01/06/2018 1231   PROTEINUR 30 (A) 01/06/2018 1231   NITRITE NEGATIVE 01/06/2018 1231   LEUKOCYTESUR LARGE (A) 01/06/2018 1231   Sepsis Labs: !!!!!!!!!!!!!!!!!!!!!!!!!!!!!!!!!!!!!!!!!!!! @LABRCNTIP (procalcitonin:4,lacticidven:4) )No results found for this or any previous visit (from the past 240 hour(s)).   Radiological Exams on Admission: US Abdomen Complete  Result Date: 01/06/2018 CLINICAL DATA:   Right upper quadrant abdominal pain for the past week. Nausea. EXAM: ABDOMEN ULTRASOUND COMPLETE COMPARISON:  None. FINDINGS: Gallbladder: Multiple gallstones in the gallbladder, the largest measuring 1.1 cm in maximum diameter. Mild diffuse gallbladder wall thickening with a maximum thickness of 3.4 mm. No pericholecystic fluid. No sonographic Murphy sign. Common bile duct: Diameter: 6.9 mm Liver: No focal lesion identified. Within normal limits in parenchymal echogenicity. Portal vein is patent on color Doppler imaging with normal direction of blood flow towards the liver. IVC: No abnormality visualized. Pancreas: Visualized portion unremarkable. Spleen: Size and appearance within normal limits. Right Kidney: Length: 8.7 cm. Diffuse cortical thinning. Prominent renal sinus fat. Normal echotexture. No hydronephrosis. Left Kidney:  Length: 8.6 cm. Diffuse cortical thinning. Prominent renal sinus fat. Normal echotexture. No hydronephrosis. Abdominal aorta: No aneurysm visualized. Other findings: None. IMPRESSION: 1. Cholelithiasis. 2. Mild diffuse gallbladder wall thickening. This is most likely due to chronic cholecystitis. 3. No biliary ductal dilatation. 4. Diffuse bilateral renal cortical atrophy. Electronically Signed   By: Beckie Salts M.D.   On: 01/06/2018 12:22   Dg Abdomen Acute W/chest  Result Date: 01/06/2018 CLINICAL DATA:  Abdominal pain.  Gallstones. EXAM: DG ABDOMEN ACUTE W/ 1V CHEST COMPARISON:  None. FINDINGS: There is no evidence of dilated bowel loops or free intraperitoneal air. No radiopaque calculi or other significant radiographic abnormality is seen. Heart size and mediastinal contours are within normal limits. Both lungs are clear. Vascular calcification of the aorta. IMPRESSION: Negative abdominal radiographs.  No acute cardiopulmonary disease. Electronically Signed   By: Elsie Stain M.D.   On: 01/06/2018 13:20      Assessment/Plan Principal Problem:   Abdominal pain Active  Problems:   Cholecystitis   Constipation   History of urinary retention   Vitamin D deficiency   HLD (hyperlipidemia)   GERD (gastroesophageal reflux disease)   HTN (hypertension)   Recurrent abdominal pain, nonspecific Chronic cholecystitis - Admit the patient as she is not able to tolerate much p.o. due to nausea -General surgery has been consulted.  Abdominal ultrasound shows cholelithiasis with mild diffuse gallbladder wall thickening without any ductal dilation - Will defer to surgery if there is any surgical role or intervention from IR necessary -We will keep the patient n.p.o. for now.  If no surgical intervention indicated will start the patient on oral diet as tolerated -We will start the patient on gentle hydration.  LFTs are normal.  Lipase is normal -Continue to provide supportive care and pain control  History of recurrent urinary tract infection and urinary retention - Patient had Foley placed about 9 days ago and would like to remove it.  We will asked the nursing staff to remove it and evaluate for her urinary retention.  Will send urine cultures.  Hold off on antibiotic.  UA appears to be infected secondary to pre-existing catheter  Chronic constipation -Bowel regimen  History of essential hypertension -Continue Avapro 37.5 mg at bedtime. On ASA 81mg  daily  Vitamin D deficiency -Continue supplements  GERD -PPI  Hyperlipidemia -Statin   DVT prophylaxis: Lovenox  Code Status: DNR Family Communication: Son at bedside  Disposition Plan:  TBD Consults called: General Surgery  Admission status: Obs   Monay Houlton Joline Maxcy MD [facility] Pager [phone]  If 7PM-7AM, please contact night-coverage [email or web address] Password [facility]  01/06/2018, 3:26 PM

## 2018-01-07 DIAGNOSIS — K8010 Calculus of gallbladder with chronic cholecystitis without obstruction: Secondary | ICD-10-CM | POA: Diagnosis not present

## 2018-01-07 DIAGNOSIS — K5904 Chronic idiopathic constipation: Secondary | ICD-10-CM | POA: Diagnosis not present

## 2018-01-07 DIAGNOSIS — K811 Chronic cholecystitis: Secondary | ICD-10-CM

## 2018-01-07 DIAGNOSIS — I10 Essential (primary) hypertension: Secondary | ICD-10-CM

## 2018-01-07 DIAGNOSIS — R1084 Generalized abdominal pain: Secondary | ICD-10-CM

## 2018-01-07 LAB — COMPREHENSIVE METABOLIC PANEL
ALBUMIN: 2.9 g/dL — AB (ref 3.5–5.0)
ALK PHOS: 69 U/L (ref 38–126)
ALT: 11 U/L — AB (ref 14–54)
AST: 18 U/L (ref 15–41)
Anion gap: 9 (ref 5–15)
BILIRUBIN TOTAL: 0.4 mg/dL (ref 0.3–1.2)
BUN: 20 mg/dL (ref 6–20)
CO2: 24 mmol/L (ref 22–32)
CREATININE: 1.37 mg/dL — AB (ref 0.44–1.00)
Calcium: 8.8 mg/dL — ABNORMAL LOW (ref 8.9–10.3)
Chloride: 107 mmol/L (ref 101–111)
GFR calc Af Amer: 37 mL/min — ABNORMAL LOW (ref >60)
GFR calc non Af Amer: 32 mL/min — ABNORMAL LOW (ref >60)
GLUCOSE: 86 mg/dL (ref 65–99)
Potassium: 3.9 mmol/L (ref 3.5–5.1)
SODIUM: 140 mmol/L (ref 135–145)
TOTAL PROTEIN: 6 g/dL — AB (ref 6.5–8.1)

## 2018-01-07 LAB — GLUCOSE, CAPILLARY
GLUCOSE-CAPILLARY: 80 mg/dL (ref 65–99)
Glucose-Capillary: 145 mg/dL — ABNORMAL HIGH (ref 65–99)
Glucose-Capillary: 81 mg/dL (ref 65–99)
Glucose-Capillary: 97 mg/dL (ref 65–99)

## 2018-01-07 LAB — CBC
HCT: 39 % (ref 36.0–46.0)
Hemoglobin: 12.3 g/dL (ref 12.0–15.0)
MCH: 28.3 pg (ref 26.0–34.0)
MCHC: 31.5 g/dL (ref 30.0–36.0)
MCV: 89.9 fL (ref 78.0–100.0)
PLATELETS: 210 10*3/uL (ref 150–400)
RBC: 4.34 MIL/uL (ref 3.87–5.11)
RDW: 16.6 % — AB (ref 11.5–15.5)
WBC: 7.1 10*3/uL (ref 4.0–10.5)

## 2018-01-07 LAB — PROTIME-INR
INR: 1.26
Prothrombin Time: 15.7 seconds — ABNORMAL HIGH (ref 11.4–15.2)

## 2018-01-07 LAB — APTT: aPTT: 34 seconds (ref 24–36)

## 2018-01-07 MED ORDER — BISACODYL 10 MG RE SUPP
10.0000 mg | Freq: Once | RECTAL | Status: AC
  Administered 2018-01-07: 10 mg via RECTAL
  Filled 2018-01-07: qty 1

## 2018-01-07 MED ORDER — BISACODYL 10 MG RE SUPP: 10.0000 mg | Freq: Once | RECTAL | Status: DC

## 2018-01-07 NOTE — Progress Notes (Signed)
    CC:  Abdominal pain with some nausea  Subjective: She looks great this AM.  NO pain, no nausea and no complaints.  Foley is out and she is using bedpan.    Objective: Vital signs in last 24 hours: Temp:  [97.9 F (36.6 C)-98.7 F (37.1 C)] 98.6 F (37 C) (03/22 0520) Pulse Rate:  [64-80] 71 (03/22 0520) Resp:  [16-18] 18 (03/22 0520) BP: (118-223)/(50-70) 164/55 (03/22 0520) SpO2:  [92 %-96 %] 92 % (03/22 0520) Weight:  [67.8 kg (149 lb 7.6 oz)-68 kg (150 lb)] 67.8 kg (149 lb 7.6 oz) (03/22 0500) Last BM Date: 01/05/18 730 IV 700 urine Afebrile, VSS Creatinine is a little better LFT's are normal WBC is 7.1 INR 1.26 Urine culture is still pending 2 view abd 3/21- negative Ultrasound 3/21:  1. Cholelithiasis. 2. Mild diffuse gallbladder wall thickening. This is most likely due to chronic cholecystitis. 3. No biliary ductal dilatation.  Intake/Output from previous day: 03/21 0701 - 03/22 0700 In: 730 [I.V.:730] Out: 700 [Urine:700] Intake/Output this shift: No intake/output data recorded.  General appearance: alert, cooperative and no distress Resp: clear to auscultation bilaterally GI: soft, non-tender; bowel sounds normal; no masses,  no organomegaly  Lab Results:  Recent Labs    01/06/18 1959 01/07/18 0554  WBC 7.3 7.1  HGB 13.7 12.3  HCT 44.5 39.0  PLT 228 210    BMET Recent Labs    01/06/18 1231 01/06/18 1959 01/07/18 0554  NA 141  --  140  K 4.3  --  3.9  CL 104  --  107  CO2 27  --  24  GLUCOSE 99  --  86  BUN 20  --  20  CREATININE 1.53* 1.34* 1.37*  CALCIUM 9.3  --  8.8*   PT/INR Recent Labs    01/07/18 0554  LABPROT 15.7*  INR 1.26    Recent Labs  Lab 01/06/18 1231 01/07/18 0554  AST 25 18  ALT 12* 11*  ALKPHOS 83 69  BILITOT 0.4 0.4  PROT 7.2 6.0*  ALBUMIN 3.7 2.9*     Lipase     Component Value Date/Time   LIPASE 24 01/06/2018 1231     Medications: . aspirin EC  81 mg Oral Daily  . enoxaparin (LOVENOX)  injection  30 mg Subcutaneous Q24H  . irbesartan  37.5 mg Oral QHS  . pantoprazole  40 mg Oral Daily  . rosuvastatin  10 mg Oral Daily   . sodium chloride 50 mL/hr at 01/06/18 1546   Anti-infectives (From admission, onward)   None      Assessment/Plan PDA with one occluded ICA, one partially occluded Dementia Chronic constipation GERD Hyperlipidemia  Hx of vertigo    Abdominal pain, and some nausea Cholelithiasis; possible chronic cholecystitis  - no pain, no nausea -  - normal LFT's/Normal WBC  Recurrent UTI - foley in place and at least the second round of abx. -  foley out and voiding   FEN:  NPO/IV fluids ID:  No abx currently DVT: Lovenox Foley:  In place on admit and removed last PM Follow up:  TBD  Plan:  I would like to try her on a diet, she is completely asymptomatic right now.  I will check with DR. Gerkin before we do that.  We discussed a HIDA scan yesterday.    LOS: 0 days    Donna Summers 01/07/2018 [phone]

## 2018-01-07 NOTE — Progress Notes (Signed)
Pt voided approximately 200 cc of yellow urine into bedpan.

## 2018-01-07 NOTE — Progress Notes (Signed)
Bladder scan performed, only reading 120 cc of urine in bladder. Will give pt more time to attempt to void.

## 2018-01-07 NOTE — Progress Notes (Signed)
Removed foley catheter. Pt tolerated well. Explained that she should call out when she gets the urge to urinate so that we can assist her and that we expected urine output within the next 6 hours or less. Pt states that she understands. will continue to monitor.

## 2018-01-07 NOTE — Progress Notes (Signed)
PROGRESS NOTE [facility]   Donna Summers   ZDG:[id number] DOB: 10/10/1920  DOA: 01/06/2018 PCP: Reesa Chew, MD   Brief Narrative:  Donna Summers is a 84 year old female with medical history significant for hypertension, hyperlipidemia and GERD who presented to the hospital with abdominal pain.  Patient with history of gallstones, report poor oral intake and generalized abdominal pain.  Prior to admission patient had Foley catheter in due to urinary retention.  Upon ED evaluation patient was clinically stable, UA  grossly abnormal, right upper quadrant ultrasound suggestive of chronic cholecystitis.  Patient was admitted for evaluation of symptomatic cholelithiasis/chronic cholecystitis and general surgery was consulted.  Subjective: Patient seen and examined, she has no complaints she is hungry wants to eat.  No acute events overnight.  Wants to go home  Assessment & Plan: Cholelithiasis/chronic cholecystitis Patient does not have acute cholecystitis. General surgery was consulted, given patient asymptomatic will let patient eat, if pain recurs recommending HIDA scan and evaluate for possible surgery.  CBD 6 mm appropriate for age, no dilation. Continue supportive care  History of recurring UTIs with urinary retention Foley catheter has been removed from patient has voided successfully UA was grossly abnormal but will await urine culture for treatment.  For now hold on antibiotics  Chronic constipation Bowel regimen  Hypertension BP stable Continue current management   DVT prophylaxis: Lovenox Code Status: DNR Family Communication: Son at bedside Disposition Plan: Home in a.m. if HIDA scan negative and tolerating diet well  Consultants:   General surgery  Procedures:   None  Antimicrobials:  None   Objective: Vitals:   01/06/18 2045 01/06/18 2151 01/07/18 0500 01/07/18 0520  BP: (!) 200/70 (!) 138/50  (!) 164/55  Pulse:  80  71  Resp:    18    Temp:    98.6 F (37 C)  TempSrc:    Oral  SpO2:    92%  Weight:   67.8 kg (149 lb 7.6 oz)   Height:        Intake/Output Summary (Last 24 hours) at 01/07/2018 1501 Last data filed at 01/07/2018 1300 Gross per 24 hour  Intake 1090 ml  Output 700 ml  Net 390 ml   Filed Weights   01/06/18 1206 01/07/18 0500  Weight: 68 kg (150 lb) 67.8 kg (149 lb 7.6 oz)    Examination:  General exam: Appears calm and comfortable  HEENT: AC/AT, PERRLA, OP moist and clear Respiratory system: Clear to auscultation. No wheezes,crackle or rhonchi Cardiovascular system: S1 & S2 heard, RRR. No JVD, murmurs, rubs or gallops Gastrointestinal system: Abdomen is nondistended, soft and nontender. No organomegaly or masses felt. Normal bowel sounds heard. Central nervous system: Alert and oriented. No focal neurological deficits. Extremities: No pedal edema. Symmetric, strength 5/5   Skin: No rashes, lesions or ulcers Psychiatry: Judgement and insight appear normal. Mood & affect appropriate.    Data Reviewed: I have personally reviewed following labs and imaging studies  CBC: Recent Labs  Lab 01/06/18 1314 01/06/18 1959 01/07/18 0554  WBC 8.3 7.3 7.1  NEUTROABS 4.9  --   --   HGB 13.5 13.7 12.3  HCT 42.7 44.5 39.0  MCV 89.7 91.8 89.9  PLT 232 228 210   Basic Metabolic Panel: Recent Labs  Lab 01/06/18 1231 01/06/18 1959 01/07/18 0554  NA 141  --  140  K 4.3  --  3.9  CL 104  --  107  CO2 27  --  24  GLUCOSE 99  --  86  BUN 20  --  20  CREATININE 1.53* 1.34* 1.37*  CALCIUM 9.3  --  8.8*   GFR: Estimated Creatinine Clearance: 22.7 mL/min (A) (by C-G formula based on SCr of 1.37 mg/dL (H)). Liver Function Tests: Recent Labs  Lab 01/06/18 1231 01/07/18 0554  AST 25 18  ALT 12* 11*  ALKPHOS 83 69  BILITOT 0.4 0.4  PROT 7.2 6.0*  ALBUMIN 3.7 2.9*   Recent Labs  Lab 01/06/18 1231  LIPASE 24   No results for input(s): AMMONIA in the last 168 hours. Coagulation  Profile: Recent Labs  Lab 01/07/18 0554  INR 1.26   Cardiac Enzymes: No results for input(s): CKTOTAL, CKMB, CKMBINDEX, TROPONINI in the last 168 hours. BNP (last 3 results) No results for input(s): PROBNP in the last 8760 hours. HbA1C: No results for input(s): HGBA1C in the last 72 hours. CBG: Recent Labs  Lab 01/07/18 0003 01/07/18 0553  GLUCAP 97 81   Lipid Profile: No results for input(s): CHOL, HDL, LDLCALC, TRIG, CHOLHDL, LDLDIRECT in the last 72 hours. Thyroid Function Tests: No results for input(s): TSH, T4TOTAL, FREET4, T3FREE, THYROIDAB in the last 72 hours. Anemia Panel: No results for input(s): VITAMINB12, FOLATE, FERRITIN, TIBC, IRON, RETICCTPCT in the last 72 hours. Sepsis Labs: No results for input(s): PROCALCITON, LATICACIDVEN in the last 168 hours.  No results found for this or any previous visit (from the past 240 hour(s)).    Radiology Studies: [place] Abdomen Complete  Result Date: 01/06/2018 CLINICAL DATA:  Right upper quadrant abdominal pain for the past week. Nausea. EXAM: ABDOMEN ULTRASOUND COMPLETE COMPARISON:  None. FINDINGS: Gallbladder: Multiple gallstones in the gallbladder, the largest measuring 1.1 cm in maximum diameter. Mild diffuse gallbladder wall thickening with a maximum thickness of 3.4 mm. No pericholecystic fluid. No sonographic Murphy sign. Common bile duct: Diameter: 6.9 mm Liver: No focal lesion identified. Within normal limits in parenchymal echogenicity. Portal vein is patent on color Doppler imaging with normal direction of blood flow towards the liver. IVC: No abnormality visualized. Pancreas: Visualized portion unremarkable. Spleen: Size and appearance within normal limits. Right Kidney: Length: 8.7 cm. Diffuse cortical thinning. Prominent renal sinus fat. Normal echotexture. No hydronephrosis. Left Kidney: Length: 8.6 cm. Diffuse cortical thinning. Prominent renal sinus fat. Normal echotexture. No hydronephrosis. Abdominal aorta: No  aneurysm visualized. Other findings: None. IMPRESSION: 1. Cholelithiasis. 2. Mild diffuse gallbladder wall thickening. This is most likely due to chronic cholecystitis. 3. No biliary ductal dilatation. 4. Diffuse bilateral renal cortical atrophy. Electronically Signed   By: Beckie SaltsSteven  Reid M.D.   On: 01/06/2018 12:22   Dg Abdomen Acute W/chest  Result Date: 01/06/2018 CLINICAL DATA:  Abdominal pain.  Gallstones. EXAM: DG ABDOMEN ACUTE W/ 1V CHEST COMPARISON:  None. FINDINGS: There is no evidence of dilated bowel loops or free intraperitoneal air. No radiopaque calculi or other significant radiographic abnormality is seen. Heart size and mediastinal contours are within normal limits. Both lungs are clear. Vascular calcification of the aorta. IMPRESSION: Negative abdominal radiographs.  No acute cardiopulmonary disease. Electronically Signed   By: Elsie StainJohn T Curnes M.D.   On: 01/06/2018 13:20    Scheduled Meds: . aspirin EC  81 mg Oral Daily  . bisacodyl  10 mg Rectal Once  . enoxaparin (LOVENOX) injection  30 mg Subcutaneous Q24H  . irbesartan  37.5 mg Oral QHS  . pantoprazole  40 mg Oral Daily  . rosuvastatin  10 mg Oral Daily   Continuous Infusions: . sodium chloride 50 mL/hr at 01/07/18  1106     LOS: 0 days    Time spent: Total of 25 minutes spent with pt, greater than 50% of which was spent in discussion of  treatment, counseling and coordination of care  Latrelle Dodrill, MD Pager: Text Page via [email or web address]   If 7PM-7AM, please contact night-coverage [email or web address] 01/07/2018, 3:01 PM   Note - This record has been created using [employer]. Chart creation errors have been sought, but may not always have been located. Such creation errors do not reflect on the standard of medical care.

## 2018-01-08 DIAGNOSIS — R1084 Generalized abdominal pain: Secondary | ICD-10-CM | POA: Diagnosis not present

## 2018-01-08 DIAGNOSIS — K8010 Calculus of gallbladder with chronic cholecystitis without obstruction: Secondary | ICD-10-CM | POA: Diagnosis not present

## 2018-01-08 DIAGNOSIS — K5904 Chronic idiopathic constipation: Secondary | ICD-10-CM | POA: Diagnosis not present

## 2018-01-08 DIAGNOSIS — K811 Chronic cholecystitis: Secondary | ICD-10-CM | POA: Diagnosis not present

## 2018-01-08 DIAGNOSIS — I10 Essential (primary) hypertension: Secondary | ICD-10-CM | POA: Diagnosis not present

## 2018-01-08 LAB — GLUCOSE, CAPILLARY
GLUCOSE-CAPILLARY: 79 mg/dL (ref 65–99)
GLUCOSE-CAPILLARY: 77 mg/dL (ref 65–99)

## 2018-01-08 MED ORDER — POLYETHYLENE GLYCOL 3350 17 G PO PACK: 17.0000 g | PACK | Freq: Every day | ORAL | 0 refills | Status: AC

## 2018-01-08 MED ORDER — FOSFOMYCIN TROMETHAMINE 3 G PO PACK
3.0000 g | PACK | Freq: Once | ORAL | Status: AC
  Administered 2018-01-08: 3 g via ORAL
  Filled 2018-01-08 (×2): qty 3

## 2018-01-08 NOTE — Discharge Summary (Signed)
Physician Discharge Summary  Donna Summers  ZOX:[id number]  DOB: 11/10/1919  DOA: 01/06/2018 PCP: Reesa Chew, MD  Admit date: 01/06/2018 Discharge date: 01/08/2018  Admitted From: ALF  Disposition: Home  Recommendations for Outpatient Follow-up:  1. Follow up with PCP in 1 week  2. Please obtain BMP in one week to monitor renal function 3. Please follow up on the following pending results: Urine culture sensitivities  Home Health: PT recommended home health PT but family declines  Discharge Condition: Stable CODE STATUS: DNR Diet recommendation: Heart Healthy   Brief/Interim Summary: For full details see H&P/Progress note, but in brief, Donna Summers is a a 84 year old female with medical history significant for hypertension, hyperlipidemia and GERD who presented to the hospital with abdominal pain.  Patient with history of gallstones, report poor oral intake and generalized abdominal pain.  Prior to admission patient had Foley catheter due to urinary retention.  Upon ED evaluation patient was clinically stable, UA  grossly abnormal, right upper quadrant ultrasound suggestive of chronic cholecystitis.  Patient was admitted for evaluation of symptomatic cholelithiasis/chronic cholecystitis and general surgery was consulted.  Upon surgical evaluation patient was completely asymptomatic, they felt that surgery was not indicated at this time.  Oral challenge was performed and patient tolerated well with no recurrence of abdominal pain.  Surgical team recommended to follow-up as an outpatient.  Urine culture grew Pseudomonas aeruginosa likely related to Foley catheter.  I discussed patient with IV and recommended no further treatment, less patient becomes symptomatic.  Patient was evaluated by physical therapy ambulated with no issues and PT for strengthening was recommended.  But family declined.  Patient was deemed stable for discharge.  Subjective: Patient seen and examined, she  continues to do well.  Wants to go home.  Denies abdominal pain, nausea and vomiting.  Alert and oriented x3.  Denies chest pain, shortness of breath and palpitation.  Discharge Diagnoses/Hospital Course:  Cholelithiasis/chronic cholecystitis Patient does not have acute cholecystitis. CBD 6 mm appropriate for age, no dilation. Pain consistent with biliary colic, surgical team recommending no surgical intervention at this time. Follow-up with PCP  History of recurring UTIs with urinary retention Urine culture grew pseudoneuroma aeruginosa Foley catheter has been removed from patient has voided successfully Case discussed with daughter-in-law who is concerned about recurrent UTIs.  I have discussed patient with infectious disease Dr. and recommendation was to give 1 dose of fosfomycin as Cipro will not be indicated in this patient due to severe side effects.  Ideally patient should not be treated and observed for symptoms but since patient has recurrent UTIs and some urinary incontinence fosfomycin was given prior to discharge.  Chronic constipation Schedule MiraLAX daily, continue lactulose 10 g daily, Colace twice a day as needed Encourage ambulation and good hydration  Hypertension BP slightly elevated during hospital stay felt to be related to IV fluids Home BP medications were resumed and IV fluids were discontinued and blood pressure trend down. Continue Irbesartan daily  Follow-up with PCP  Family asked to reduce medications as they feel the patient is taking too many medications. Aspirin d/ced no evidence of secondary prevention in this age. Pantoprazole has been discontinued she use Carafate and Tums daily which have safer profile.  Will recommend PCP to check lipid panel she might can come off Crestor  All other chronic medical condition were stable during the hospitalization.  Patient was seen by physical therapy, recommending home health PT but family refusing On the day of  the discharge  the patient's vitals were stable, and no other acute medical condition were reported by patient. the patient was felt safe to be discharge to home  Discharge Instructions  You were cared for by a hospitalist during your hospital stay. If you have any questions about your discharge medications or the care you received while you were in the hospital after you are discharged, you can call the unit and asked to speak with the hospitalist on call if the hospitalist that took care of you is not available. Once you are discharged, your primary care physician will handle any further medical issues. Please note that NO REFILLS for any discharge medications will be authorized once you are discharged, as it is imperative that you return to your primary care physician (or establish a relationship with a primary care physician if you do not have one) for your aftercare needs so that they can reassess your need for medications and monitor your lab values.  Discharge Instructions    Call MD for:  difficulty breathing, headache or visual disturbances   Complete by:  As directed    Call MD for:  extreme fatigue   Complete by:  As directed    Call MD for:  hives   Complete by:  As directed    Call MD for:  persistant dizziness or light-headedness   Complete by:  As directed    Call MD for:  persistant nausea and vomiting   Complete by:  As directed    Call MD for:  redness, tenderness, or signs of infection (pain, swelling, redness, odor or green/yellow discharge around incision site)   Complete by:  As directed    Call MD for:  severe uncontrolled pain   Complete by:  As directed    Call MD for:  temperature >100.4   Complete by:  As directed    Diet - low sodium heart healthy   Complete by:  As directed    Increase activity slowly   Complete by:  As directed      Allergies as of 01/08/2018      Reactions   Bactrim [sulfamethoxazole-trimethoprim] Other (See Comments)   unknown   Levaquin  [levofloxacin] Other (See Comments)   unknown      Medication List    STOP taking these medications   aspirin EC 81 MG tablet   bisacodyl 10 MG suppository Commonly known as:  DULCOLAX   nitrofurantoin 100 MG capsule Commonly known as:  MACRODANTIN   pantoprazole 40 MG tablet Commonly known as:  PROTONIX   sucralfate 1 g tablet Commonly known as:  CARAFATE     TAKE these medications   acetaminophen 325 MG tablet Commonly known as:  TYLENOL Take 650 mg by mouth every 6 (six) hours as needed for mild pain or moderate pain.   BRINZOLAMIDE-BRIMONIDINE OP Apply 1 drop to eye 3 (three) times daily. LEFT EYE   calcium carbonate 500 MG chewable tablet Commonly known as:  TUMS - dosed in mg elemental calcium Chew 2 tablets by mouth 3 (three) times daily as needed for indigestion.   cholecalciferol 1000 units tablet Commonly known as:  VITAMIN D Take 1,000 Units by mouth daily.   docusate sodium 100 MG capsule Commonly known as:  COLACE Take 100 mg by mouth 2 (two) times daily as needed for mild constipation.   irbesartan 75 MG tablet Commonly known as:  AVAPRO Take 37.5 mg by mouth at bedtime. TAKES 1/2 TABLET   lactulose 10 GM/15ML solution  Commonly known as:  CHRONULAC Take 10 g by mouth daily.   meclizine 12.5 MG tablet Commonly known as:  ANTIVERT Take 12.5 mg by mouth 3 (three) times daily as needed for dizziness.   polyethylene glycol packet Commonly known as:  MIRALAX / GLYCOLAX Take 17 g by mouth daily. What changed:    when to take this  reasons to take this   rosuvastatin 10 MG tablet Commonly known as:  CRESTOR Take 10 mg by mouth daily.   travoprost (benzalkonium) 0.004 % ophthalmic solution Commonly known as:  TRAVATAN Place 1 drop into the left eye at bedtime.      Follow-up Information    Reesa Chew, MD. Schedule an appointment as soon as possible for a visit in 1 week(s).   Specialty:  Family Medicine Why:  Hospital  follow-up Contact information: [address] [place] [phone]          Allergies  Allergen Reactions  . Bactrim [Sulfamethoxazole-Trimethoprim] Other (See Comments)    unknown  . Levaquin [Levofloxacin] Other (See Comments)    unknown    Consultations:  Gen Surgery   Procedures/Studies: US Abdomen Complete  Result Date: 01/06/2018 CLINICAL DATA:  Right upper quadrant abdominal pain for the past week. Nausea. EXAM: ABDOMEN ULTRASOUND COMPLETE COMPARISON:  None. FINDINGS: Gallbladder: Multiple gallstones in the gallbladder, the largest measuring 1.1 cm in maximum diameter. Mild diffuse gallbladder wall thickening with a maximum thickness of 3.4 mm. No pericholecystic fluid. No sonographic Murphy sign. Common bile duct: Diameter: 6.9 mm Liver: No focal lesion identified. Within normal limits in parenchymal echogenicity. Portal vein is patent on color Doppler imaging with normal direction of blood flow towards the liver. IVC: No abnormality visualized. Pancreas: Visualized portion unremarkable. Spleen: Size and appearance within normal limits. Right Kidney: Length: 8.7 cm. Diffuse cortical thinning. Prominent renal sinus fat. Normal echotexture. No hydronephrosis. Left Kidney: Length: 8.6 cm. Diffuse cortical thinning. Prominent renal sinus fat. Normal echotexture. No hydronephrosis. Abdominal aorta: No aneurysm visualized. Other findings: None. IMPRESSION: 1. Cholelithiasis. 2. Mild diffuse gallbladder wall thickening. This is most likely due to chronic cholecystitis. 3. No biliary ductal dilatation. 4. Diffuse bilateral renal cortical atrophy. Electronically Signed   By: Beckie Salts M.D.   On: 01/06/2018 12:22   Dg Abdomen Acute W/chest  Result Date: 01/06/2018 CLINICAL DATA:  Abdominal pain.  Gallstones. EXAM: DG ABDOMEN ACUTE W/ 1V CHEST COMPARISON:  None. FINDINGS: There is no evidence of dilated bowel loops or free intraperitoneal air. No radiopaque  calculi or other significant radiographic abnormality is seen. Heart size and mediastinal contours are within normal limits. Both lungs are clear. Vascular calcification of the aorta. IMPRESSION: Negative abdominal radiographs.  No acute cardiopulmonary disease. Electronically Signed   By: Elsie Stain M.D.   On: 01/06/2018 13:20    Discharge Exam: Vitals:   01/08/18 0759 01/08/18 1500  BP: (!) 158/49 (!) 155/50  Pulse: 66 71  Resp:  18  Temp:  (!) 97.4 F (36.3 C)  SpO2: 97% 98%   Vitals:   01/08/18 0411 01/08/18 0412 01/08/18 0759 01/08/18 1500  BP:  (!) 185/57 (!) 158/49 (!) 155/50  Pulse:  76 66 71  Resp:  18  18  Temp:  97.7 F (36.5 C)  (!) 97.4 F (36.3 C)  TempSrc:  Oral  Oral  SpO2:  93% 97% 98%  Weight: 67.6 kg (149 lb)     Height:        General: NAD Cardiovascular:  RRR, S1/S2 +, no rubs, no gallops Respiratory: CTA bilaterally, no wheezing, no rhonchi Abdominal: Soft, NT, ND, bowel sounds + Extremities: no edema  The results of significant diagnostics from this hospitalization (including imaging, microbiology, ancillary and laboratory) are listed below for reference.     Microbiology: Recent Results (from the past 240 hour(s))  Culture, Urine     Status: Abnormal (Preliminary result)   Collection Time: 01/06/18 12:31 PM  Result Value Ref Range Status   Specimen Description   Final    URINE, RANDOM Performed at [facility], [address], [place], [place] [address]    Special Requests   Final    NONE Performed at [facility], [address] [place], [place] [address]    Culture (A)  Final    >=100,000 COLONIES/mL PSEUDOMONAS AERUGINOSA SUSCEPTIBILITIES TO FOLLOW Performed at [facility], [address], [place], [place] [address]    Report Status PENDING  Incomplete     Labs: BNP (last 3 results) No results for input(s): BNP in the last 8760 hours. Basic Metabolic Panel: Recent Labs  Lab  01/06/18 1231 01/06/18 1959 01/07/18 0554  NA 141  --  140  K 4.3  --  3.9  CL 104  --  107  CO2 27  --  24  GLUCOSE 99  --  86  BUN 20  --  20  CREATININE 1.53* 1.34* 1.37*  CALCIUM 9.3  --  8.8*   Liver Function Tests: Recent Labs  Lab 01/06/18 1231 01/07/18 0554  AST 25 18  ALT 12* 11*  ALKPHOS 83 69  BILITOT 0.4 0.4  PROT 7.2 6.0*  ALBUMIN 3.7 2.9*   Recent Labs  Lab 01/06/18 1231  LIPASE 24   No results for input(s): AMMONIA in the last 168 hours. CBC: Recent Labs  Lab 01/06/18 1314 01/06/18 1959 01/07/18 0554  WBC 8.3 7.3 7.1  NEUTROABS 4.9  --   --   HGB 13.5 13.7 12.3  HCT 42.7 44.5 39.0  MCV 89.7 91.8 89.9  PLT 232 228 210   Cardiac Enzymes: No results for input(s): CKTOTAL, CKMB, CKMBINDEX, TROPONINI in the last 168 hours. BNP: Invalid input(s): POCBNP CBG: Recent Labs  Lab 01/07/18 0553 01/07/18 1803 01/07/18 2343 01/08/18 0532 01/08/18 1204  GLUCAP 81 145* 80 79 77   D-Dimer No results for input(s): DDIMER in the last 72 hours. Hgb A1c No results for input(s): HGBA1C in the last 72 hours. Lipid Profile No results for input(s): CHOL, HDL, LDLCALC, TRIG, CHOLHDL, LDLDIRECT in the last 72 hours. Thyroid function studies No results for input(s): TSH, T4TOTAL, T3FREE, THYROIDAB in the last 72 hours.  Invalid input(s): FREET3 Anemia work up No results for input(s): VITAMINB12, FOLATE, FERRITIN, TIBC, IRON, RETICCTPCT in the last 72 hours. Urinalysis    Component Value Date/Time   COLORURINE AMBER (A) 01/06/2018 1231   APPEARANCEUR HAZY (A) 01/06/2018 1231   LABSPEC 1.014 01/06/2018 1231   PHURINE 6.0 01/06/2018 1231   GLUCOSEU NEGATIVE 01/06/2018 1231   HGBUR LARGE (A) 01/06/2018 1231   BILIRUBINUR NEGATIVE 01/06/2018 1231   KETONESUR NEGATIVE 01/06/2018 1231   PROTEINUR 30 (A) 01/06/2018 1231   NITRITE NEGATIVE 01/06/2018 1231   LEUKOCYTESUR LARGE (A) 01/06/2018 1231   Sepsis Labs Invalid input(s): PROCALCITONIN,  WBC,   LACTICIDVEN Microbiology Recent Results (from the past 240 hour(s))  Culture, Urine     Status: Abnormal (Preliminary result)   Collection Time: 01/06/18 12:31 PM  Result Value Ref Range Status   Specimen Description   Final    URINE, RANDOM Performed at [facility], [address] [place], [place] [address]    Special Requests   Final    NONE Performed at [facility], [address] [place], [place] [address]    Culture (A)  Final    >=100,000 COLONIES/mL PSEUDOMONAS AERUGINOSA SUSCEPTIBILITIES TO FOLLOW Performed at [facility], [address] [place], [place] [address]    Report Status PENDING  Incomplete    Time coordinating discharge: 35 minutes  SIGNED:  Latrelle DodrillEdwin Silva, MD  [facility] 01/08/2018, 4:25 PM  Pager please text page via  [email or web address]  Note - This record has been created using [employer] software. Chart creation errors have been sought, but may not always have been located. Such creation errors do not reflect on the standard of medical care.

## 2018-01-08 NOTE — Progress Notes (Signed)
Pt leaving this afternoon with her family. Going home with her son and daughter-in-law. Pt alert and oriented. Without c/o. Discharge instructions/prescription given/explained with pt and family verbalizing understanding. Followup appointment noted.

## 2018-01-08 NOTE — Progress Notes (Signed)
Patient ID: Donna Summers, female   DOB: 1920-07-13, 84 y.o.   MRN: [id number]     Subjective: Denies any abdominal pain.  Patient is somewhat poor historian due to baseline confusion.  Daughter has several concerns about patient going home including mobility (not out of bed in 2-3 days) and poor appetite.  No apparent nausea or vomiting.  Objective: Vital signs in last 24 hours: Temp:  [97.5 F (36.4 C)-98.7 F (37.1 C)] 97.7 F (36.5 C) (03/23 0412) Pulse Rate:  [66-76] 66 (03/23 0759) Resp:  [15-18] 18 (03/23 0412) BP: (158-189)/(49-57) 158/49 (03/23 0759) SpO2:  [93 %-97 %] 97 % (03/23 0759) Weight:  [67.6 kg (149 lb)] 67.6 kg (149 lb) (03/23 0411) Last BM Date: 01/05/18  Intake/Output from previous day: 03/22 0701 - 03/23 0700 In: 600 [P.O.:600] Out: 800 [Urine:800] Intake/Output this shift: Total I/O In: 240 [P.O.:240] Out: -   General appearance: alert and Pleasant, mildly confused GI: normal findings: soft, non-tender and Nondistended  Lab Results:  Recent Labs    01/06/18 1959 01/07/18 0554  WBC 7.3 7.1  HGB 13.7 12.3  HCT 44.5 39.0  PLT 228 210   BMET Recent Labs    01/06/18 1231 01/06/18 1959 01/07/18 0554  NA 141  --  140  K 4.3  --  3.9  CL 104  --  107  CO2 27  --  24  GLUCOSE 99  --  86  BUN 20  --  20  CREATININE 1.53* 1.34* 1.37*  CALCIUM 9.3  --  8.8*     Studies/Results: US Abdomen Complete  Result Date: 01/06/2018 CLINICAL DATA:  Right upper quadrant abdominal pain for the past week. Nausea. EXAM: ABDOMEN ULTRASOUND COMPLETE COMPARISON:  None. FINDINGS: Gallbladder: Multiple gallstones in the gallbladder, the largest measuring 1.1 cm in maximum diameter. Mild diffuse gallbladder wall thickening with a maximum thickness of 3.4 mm. No pericholecystic fluid. No sonographic Murphy sign. Common bile duct: Diameter: 6.9 mm Liver: No focal lesion identified. Within normal limits in parenchymal echogenicity. Portal vein is patent on color  Doppler imaging with normal direction of blood flow towards the liver. IVC: No abnormality visualized. Pancreas: Visualized portion unremarkable. Spleen: Size and appearance within normal limits. Right Kidney: Length: 8.7 cm. Diffuse cortical thinning. Prominent renal sinus fat. Normal echotexture. No hydronephrosis. Left Kidney: Length: 8.6 cm. Diffuse cortical thinning. Prominent renal sinus fat. Normal echotexture. No hydronephrosis. Abdominal aorta: No aneurysm visualized. Other findings: None. IMPRESSION: 1. Cholelithiasis. 2. Mild diffuse gallbladder wall thickening. This is most likely due to chronic cholecystitis. 3. No biliary ductal dilatation. 4. Diffuse bilateral renal cortical atrophy. Electronically Signed   By: Beckie Salts M.D.   On: 01/06/2018 12:22   Dg Abdomen Acute W/chest  Result Date: 01/06/2018 CLINICAL DATA:  Abdominal pain.  Gallstones. EXAM: DG ABDOMEN ACUTE W/ 1V CHEST COMPARISON:  None. FINDINGS: There is no evidence of dilated bowel loops or free intraperitoneal air. No radiopaque calculi or other significant radiographic abnormality is seen. Heart size and mediastinal contours are within normal limits. Both lungs are clear. Vascular calcification of the aorta. IMPRESSION: Negative abdominal radiographs.  No acute cardiopulmonary disease. Electronically Signed   By: Elsie Stain M.D.   On: 01/06/2018 13:20    Anti-infectives: Anti-infectives (From admission, onward)   None      Assessment/Plan: 84 year old patient with episode of abdominal pain consistent with biliary colic or early cholecystitis.  She is now asymptomatic.  I would agree that surgical intervention is not  indicated.  Discussed with daughter.    LOS: 0 days    Mariella SaaBenjamin T Yamil Oelke 01/08/2018

## 2018-01-08 NOTE — Evaluation (Signed)
Physical Therapy Evaluation Patient Details Name: Donna Summers MRN: [id number] DOB: Jul 18, 1920 Today's Date: 01/08/2018   History of Present Illness  Donna Slatergnes Maka is a 84 year old female with medical history significant for hypertension, hyperlipidemia and GERD who presented to the hospital with abdominal pain consistent with biliary colic or early cholecystitis.  She is now asymptomatic.   surgical intervention is not indicated.   Clinical Impression  Patient evaluated by Physical Therapy with no further acute PT needs identified. All education has been completed and the patient has no further questions. Pt is quite independent at her baseline, amb 160' with RW aadn supervision; encouraged mobility at home with RW as tolerated, supervision initially and then pt should be able to return to her ALF. See below for any follow-up Physical Therapy or equipment needs. PT is signing off. Thank you for this referral.     Follow Up Recommendations (family declines HHPT, may do PT once back to her ALF)    Equipment Recommendations  None recommended by PT    Recommendations for Other Services       Precautions / Restrictions Precautions Precautions: Fall Restrictions Weight Bearing Restrictions: No      Mobility  Bed Mobility Overal bed mobility: Needs Assistance Bed Mobility: Supine to Sit;Sit to Supine     Supine to sit: Supervision Sit to supine: Supervision   General bed mobility comments: for safety, incr time needed, able to perform without physical assist  Transfers Overall transfer level: Needs assistance Equipment used: Rolling walker (2 wheeled) Transfers: Sit to/from Stand Sit to Stand: Supervision         General transfer comment: for safety, incr time needed  Ambulation/Gait Ambulation/Gait assistance: Supervision Ambulation Distance (Feet): 120 Feet Assistive device: Rolling walker (2 wheeled) Gait Pattern/deviations: Step-through pattern;Decreased stride  length;Trunk flexed;Narrow base of support     General Gait Details: pt steady with use of RW, cues for trunk extension, no LOB  Stairs            Wheelchair Mobility    Modified Rankin (Stroke Patients Only)       Balance Overall balance assessment: (pt denies falls)         Standing balance support: During functional activity;Bilateral upper extremity supported Standing balance-Leahy Scale: Fair Standing balance comment: reliant on UEs for dynamic balance                             Pertinent Vitals/Pain Pain Assessment: No/denies pain    Home Living Family/patient expects to be discharged to:: Private residence   Available Help at Discharge: Family Type of Home: House Home Access: Stairs to enter   [employer] Corporation[employer]trance Stairs-Number of Steps: 5 Home Layout: Two level;Able to live on main level with bedroom/bathroom Home Equipment: Walker - 2 wheels Additional Comments: plan is for pt to stay with her son here in [place] for a few days and then return to her ALF    Prior Function Level of Independence: Independent with assistive device(s);Independent         Comments: lives in ALF, IND ADLS, amb iwth RW     Hand Dominance        Extremity/Trunk Assessment   Upper Extremity Assessment Upper Extremity Assessment: Overall WFL for tasks assessed(grossly WFL, at baseline for UEs/LEs)    Lower Extremity Assessment Lower Extremity Assessment: Overall WFL for tasks assessed       Communication   Communication: No difficulties  Cognition Arousal/Alertness: Awake/alert  Behavior During Therapy: WFL for tasks assessed/performed Overall Cognitive Status: Within Functional Limits for tasks assessed Area of Impairment: Memory                     Memory: Decreased short-term memory         General Comments: mild STM deficits vs difficulty hearing; pt repeats info that was just discussed with her son      General Comments       Exercises     Assessment/Plan    PT Assessment Patient needs continued PT services  PT Problem List         PT Treatment Interventions      PT Goals (Current goals can be found in the Care Plan section)  Acute Rehab PT Goals Patient Stated Goal: back to her home PT Goal Formulation: All assessment and education complete, DC therapy    Frequency     Barriers to discharge        Co-evaluation               AM-PAC PT "6 Clicks" Daily Activity  Outcome Measure Difficulty turning over in bed (including adjusting bedclothes, sheets and blankets)?: A Little Difficulty moving from lying on back to sitting on the side of the bed? : A Little Difficulty sitting down on and standing up from a chair with arms (e.g., wheelchair, bedside commode, etc,.)?: A Little Help needed moving to and from a bed to chair (including a wheelchair)?: A Little Help needed walking in hospital room?: A Little Help needed climbing 3-5 steps with a railing? : A Little 6 Click Score: 18    End of Session Equipment Utilized During Treatment: Gait belt Activity Tolerance: Patient tolerated treatment well Patient left: in bed;with call bell/phone within reach;with bed alarm set;with family/visitor present   PT Visit Diagnosis: Unsteadiness on feet (R26.81)    Time: 1610-9604 PT Time Calculation (min) (ACUTE ONLY): 27 min   Charges:   PT Evaluation $PT Eval Low Complexity: 1 Low PT Treatments $Gait Training: 8-22 mins   PT G CodesDrucilla Chalet, PT Pager: [phone] 01/08/2018   Drucilla Chalet 01/08/2018, 4:00 PM

## 2018-01-09 LAB — URINE CULTURE

## 2018-01-11 ENCOUNTER — Telehealth: Payer: Self-pay

## 2018-01-11 NOTE — Telephone Encounter (Signed)
Copied from CRM [id number]. Topic: Appointment Scheduling - Scheduling Inquiry for Clinic >> Jan 11, 2018  2:27 PM Arlyss Gandyichardson, Taren N, NT wrote: Reason for CRM: Pts son called and wanting to see if Dr. Drue NovelPaz would be willing to accept his mom as a new patient? She is in a assisted living [place] in [place] and per son is very "frail" since her recent [facility]. He is also thinking of setting his mom up with hospice. Please contact son.

## 2018-01-11 NOTE — Telephone Encounter (Signed)
Okay to schedule NP appt per Dr. Drue NovelPaz- please schedule as 40 minute appt.

## 2018-01-11 NOTE — Telephone Encounter (Signed)
Okay to schedule as a new patient.  Unfortunately, I won't  be able to sign any documentation unless I see her.

## 2018-01-11 NOTE — Telephone Encounter (Signed)
Please advise 

## 2018-01-12 NOTE — Telephone Encounter (Signed)
Called and left vm for son to call us back to set up NP appt.

## 2018-01-12 NOTE — Telephone Encounter (Signed)
Appt scheduled 01/19/2018 w/ Dr. Drue NovelPaz.

## 2018-01-13 ENCOUNTER — Encounter (HOSPITAL_COMMUNITY): Payer: Self-pay

## 2018-01-13 ENCOUNTER — Emergency Department (HOSPITAL_COMMUNITY): Payer: Medicare Other

## 2018-01-13 ENCOUNTER — Other Ambulatory Visit: Payer: Self-pay

## 2018-01-13 ENCOUNTER — Emergency Department (HOSPITAL_COMMUNITY)
Admission: EM | Admit: 2018-01-13 | Discharge: 2018-01-13 | Disposition: A | Discharge status: Home / Self Care | Payer: Medicare Other | Attending: Emergency Medicine | Admitting: Emergency Medicine

## 2018-01-13 DIAGNOSIS — I10 Essential (primary) hypertension: Secondary | ICD-10-CM | POA: Insufficient documentation

## 2018-01-13 DIAGNOSIS — Z79899 Other long term (current) drug therapy: Secondary | ICD-10-CM | POA: Diagnosis not present

## 2018-01-13 DIAGNOSIS — R1010 Upper abdominal pain, unspecified: Secondary | ICD-10-CM | POA: Insufficient documentation

## 2018-01-13 DIAGNOSIS — K5900 Constipation, unspecified: Secondary | ICD-10-CM | POA: Insufficient documentation

## 2018-01-13 DIAGNOSIS — Z7982 Long term (current) use of aspirin: Secondary | ICD-10-CM | POA: Insufficient documentation

## 2018-01-13 DIAGNOSIS — R110 Nausea: Secondary | ICD-10-CM | POA: Diagnosis not present

## 2018-01-13 DIAGNOSIS — R531 Weakness: Secondary | ICD-10-CM

## 2018-01-13 DIAGNOSIS — M6281 Muscle weakness (generalized): Secondary | ICD-10-CM | POA: Diagnosis not present

## 2018-01-13 LAB — COMPREHENSIVE METABOLIC PANEL
ALK PHOS: 66 U/L (ref 38–126)
ALT: 14 U/L (ref 14–54)
AST: 22 U/L (ref 15–41)
Albumin: 3 g/dL — ABNORMAL LOW (ref 3.5–5.0)
Anion gap: 6 (ref 5–15)
BUN: 23 mg/dL — AB (ref 6–20)
CALCIUM: 9.4 mg/dL (ref 8.9–10.3)
CHLORIDE: 112 mmol/L — AB (ref 101–111)
CO2: 27 mmol/L (ref 22–32)
CREATININE: 1.19 mg/dL — AB (ref 0.44–1.00)
GFR calc Af Amer: 44 mL/min — ABNORMAL LOW (ref >60)
GFR calc non Af Amer: 38 mL/min — ABNORMAL LOW (ref >60)
Glucose, Bld: 126 mg/dL — ABNORMAL HIGH (ref 65–99)
Potassium: 4.3 mmol/L (ref 3.5–5.1)
SODIUM: 145 mmol/L (ref 135–145)
Total Bilirubin: 0.2 mg/dL — ABNORMAL LOW (ref 0.3–1.2)
Total Protein: 6.3 g/dL — ABNORMAL LOW (ref 6.5–8.1)

## 2018-01-13 LAB — CBC
HCT: 42.2 % (ref 36.0–46.0)
HEMOGLOBIN: 13.8 g/dL (ref 12.0–15.0)
MCH: 29.2 pg (ref 26.0–34.0)
MCHC: 32.7 g/dL (ref 30.0–36.0)
MCV: 89.4 fL (ref 78.0–100.0)
PLATELETS: 268 10*3/uL (ref 150–400)
RBC: 4.72 MIL/uL (ref 3.87–5.11)
RDW: 16.2 % — ABNORMAL HIGH (ref 11.5–15.5)
WBC: 7.9 10*3/uL (ref 4.0–10.5)

## 2018-01-13 LAB — URINALYSIS, ROUTINE W REFLEX MICROSCOPIC
BILIRUBIN URINE: NEGATIVE
GLUCOSE, UA: NEGATIVE mg/dL
HGB URINE DIPSTICK: NEGATIVE
Ketones, ur: NEGATIVE mg/dL
Nitrite: NEGATIVE
PH: 7 (ref 5.0–8.0)
Protein, ur: NEGATIVE mg/dL
SPECIFIC GRAVITY, URINE: 1.019 (ref 1.005–1.030)

## 2018-01-13 LAB — TROPONIN I: Troponin I: <0.03 ng/mL (ref <0.03)

## 2018-01-13 LAB — LIPASE, BLOOD: Lipase: 25 U/L (ref 11–51)

## 2018-01-13 MED ORDER — IOPAMIDOL (ISOVUE-300) INJECTION 61%
100.0000 mL | Freq: Once | INTRAVENOUS | Status: AC | PRN
  Administered 2018-01-13: 80 mL via INTRAVENOUS

## 2018-01-13 MED ORDER — IRBESARTAN 75 MG PO TABS
75.0000 mg | ORAL_TABLET | Freq: Once | ORAL | Status: AC
  Administered 2018-01-13: 75 mg via ORAL
  Filled 2018-01-13: qty 1

## 2018-01-13 MED ORDER — IOPAMIDOL (ISOVUE-300) INJECTION 61%
INTRAVENOUS | Status: AC
  Filled 2018-01-13: qty 100

## 2018-01-13 MED ORDER — SODIUM CHLORIDE 0.9 % IV BOLUS
1000.0000 mL | Freq: Once | INTRAVENOUS | Status: AC
  Administered 2018-01-13: 1000 mL via INTRAVENOUS

## 2018-01-13 NOTE — ED Notes (Signed)
WICK PUT I PLACE TO COLLECT A URINE SAMPLE

## 2018-01-13 NOTE — Care Management Note (Signed)
Case Management Note  Patient Details  Name: Donna Summers MRN: [id number] Date of Birth: 11-Dec-1919  [facility]  CM noted being familiar with pt from previous encounter in the ED from chart review.  CM spoke with pt and sons at bedside to formulate a plan.  [facility] is from [facility] [place]  Several options were mentioned including staying local with a local HH team assisting the pt with SNF placement, or [facility] if they would be willing to accept her back.  Pt is requesting to be taken back to her ALF and sons are ok with her trying to remain at ALF for now with [facility].  CM attempted calling [facility] with phone numbers found through Internet research without success.  CM contacted pt's PCP office, [phone], Dr. Raul DelAlston, and spoke with his nurse Darl PikesSusan.  She advised she was unaware the pt was in [place] and had been in the hospital.  She also stated Dr. Raul DelAlston was not in the office today, so she was not sure if he would continue [facility] orders once the pt returned to the area or if [facility] would accept her back.  CM and Darl PikesSusan scheduled a follow up appointment with Dr. Raul DelAlston on 01/20/2018 and she was able to provide CM with a correct phone number for [facility].  CM additionally faxed D/C information from admission and ED visit to her PCP office, fax [phone].  CM contacted [facility], [phone], and spoke with Darel HongJudy who advised their admission director had already left for the day but she would contact her with CM's inquiry about the pt returning.  Received a return call from Darel HongJudy saying the pt could return to their facility [facility].  She additionally advised that [facility] is who they contract with and provided a phone number.  CM contacted [facility], [phone] and spoke with Alona BeneJoyce who knows the pt very well.  She advised they could provide [facility] and no other services at this time.  She stated Dr. Raul DelAlston had  already sent them a [facility] PT order during the time the [place] has been in [place].  CM faxed orders and information to [phone].  CM spoke with pt and son with Dr. Patria Maneampos at beside.  Pt and family are agreeable to the plan of returning to [facility].  They are aware that pt has a follow up appointment with Dr. Raul DelAlston on 01/21/2108.  They will cancel an establishment appointment with Dr. Drue NovelPaz for 01/20/2108 and plan to drive pt back to her ALF on 01/17/2108.  There were no further questions.  CM contacted Candie MileJoyce, Susan, and Darel HongJudy back at their respective numbers to advise the pt would be back at [facility] on Sunday.  All information placed on AVS.  No further CM needs noted at this time.  Expected Discharge Date:   01/13/2018               Expected Discharge Plan:  Home w Home Health Services  Discharge planning Services  CM Consult, DC out of service area  Post Acute Care Choice:  Home Health Choice offered to:  Patient, Adult Children  HH Arranged:  PT HH Agency:  [facility]  Status of Service:  Completed, signed off  Rica KoyanagiKritzer, Almeter Westhoff N, RN 01/13/2018, 5:01 PM

## 2018-01-13 NOTE — ED Provider Notes (Signed)
[facility]-EMERGENCY DEPT Provider Note   CSN: [id number] Arrival date & time: 01/13/18  1120     History   Chief Complaint Chief Complaint  Patient presents with  . Hypertension  . Constipation    HPI Donna Summers is a 84 y.o. female.  HPI Patient is a 84 year old female who was recently seen and evaluated in the hospital for abdominal pain felt to be biliary colic but was not a great surgical candidate.  Patient was thought to not have acute or chronic cholecystitis by the general surgery team.  Over the past week since being discharged she is continued to have intermittent flares of upper abdominal pain associated nausea.  Currently she is without symptoms.  Family reports decline mentally and some confusion.  They report mild decreased oral intake.  No bowel movement in the past 5 days despite daily MiraLAX.  Patient was found to have elevated blood pressures into the 200s at home today.  The constellation of symptoms concerning the family especially given her ongoing symptoms of intermittent abdominal pain and thus she was brought to the ER for further evaluation.   Past Medical History:  Diagnosis Date  . Constipation   . Constipation   . GERD (gastroesophageal reflux disease)   . History of carotid artery disease   . Hyperlipidemia   . UTI (urinary tract infection)   . Vertigo     Patient Active Problem List   Diagnosis Date Noted  . Cholecystitis 01/06/2018  . Constipation 01/06/2018  . History of urinary retention 01/06/2018  . Abdominal pain 01/06/2018  . Vitamin D deficiency 01/06/2018  . HLD (hyperlipidemia) 01/06/2018  . GERD (gastroesophageal reflux disease) 01/06/2018  . HTN (hypertension) 01/06/2018    History reviewed. No pertinent surgical history.   OB History   None      Home Medications    Prior to Admission medications   Medication Sig Start Date End Date Taking? Authorizing Provider  acetaminophen (TYLENOL) 325  MG tablet Take 650 mg by mouth every 6 (six) hours as needed for mild pain or moderate pain.   Yes [provider]  aspirin EC 81 MG tablet Take 81 mg by mouth daily.   Yes [provider]  [facility] OP Apply 1 drop to eye 3 (three) times daily. LEFT EYE   Yes [provider]  calcium carbonate (TUMS - DOSED IN MG ELEMENTAL CALCIUM) 500 MG chewable tablet Chew 2 tablets by mouth 3 (three) times daily as needed for indigestion.   Yes [provider]  cholecalciferol (VITAMIN D) 1000 units tablet Take 1,000 Units by mouth daily.   Yes [provider]  docusate sodium (COLACE) 100 MG capsule Take 100 mg by mouth 2 (two) times daily as needed for mild constipation.   Yes [provider]  irbesartan (AVAPRO) 75 MG tablet Take 37.5 mg by mouth at bedtime. TAKES 1/2 TABLET   Yes [provider]  lactulose (CHRONULAC) 10 GM/15ML solution Take 10 g by mouth daily.   Yes [provider]  meclizine (ANTIVERT) 12.5 MG tablet Take 12.5 mg by mouth 3 (three) times daily as needed for dizziness.   Yes [provider]  polyethylene glycol (MIRALAX / GLYCOLAX) packet Take 17 g by mouth daily. 01/08/18  Yes Randel Pigg, Dorma Russell, MD  rosuvastatin (CRESTOR) 10 MG tablet Take 10 mg by mouth daily.   Yes [provider]  sucralfate (CARAFATE) 1 g tablet Take 1 g by mouth 2 (two) times  daily.   Yes [provider]  travoprost, benzalkonium, (TRAVATAN) 0.004 % ophthalmic solution Place 1 drop into the left eye at bedtime.   Yes [provider]    Family History History reviewed. No pertinent family history.  Social History Social History   Tobacco Use  . Smoking status: Never Smoker  . Smokeless tobacco: Never Used  Substance Use Topics  . Alcohol use: Not Currently    Frequency: Never  . Drug use: Never     Allergies   Bactrim [sulfamethoxazole-trimethoprim] and Levaquin  [levofloxacin]   Review of Systems Review of Systems  All other systems reviewed and are negative.    Physical Exam Updated Vital Signs BP (!) 139/51   Pulse 63   Temp 98.4 F (36.9 C) (Rectal)   Resp 16   Ht 5\' 3"  (1.6 m)   Wt 68 kg (150 lb)   SpO2 91%   BMI 26.57 kg/m   Physical Exam  Constitutional: She is oriented to person, place, and time. She appears well-developed and well-nourished. No distress.  HENT:  Head: Normocephalic and atraumatic.  Eyes: EOM are normal.  Neck: Normal range of motion.  Cardiovascular: Normal rate, regular rhythm and normal heart sounds.  Pulmonary/Chest: Effort normal and breath sounds normal.  Abdominal: Soft. She exhibits no distension.  Mild generalized abdominal tenderness without guarding or rebound.  Musculoskeletal: Normal range of motion.  Neurological: She is alert and oriented to person, place, and time.  Moves all 4 extremities equally.  Skin: Skin is warm and dry.  Psychiatric: She has a normal mood and affect.  Nursing note and vitals reviewed.    ED Treatments / Results  Labs (all labs ordered are listed, but only abnormal results are displayed) Labs Reviewed  CBC - Abnormal; Notable for the following components:      Result Value   RDW 16.2 (*)    All other components within normal limits  COMPREHENSIVE METABOLIC PANEL - Abnormal; Notable for the following components:   Chloride 112 (*)    Glucose, Bld 126 (*)    BUN 23 (*)    Creatinine, Ser 1.19 (*)    Total Protein 6.3 (*)    Albumin 3.0 (*)    Total Bilirubin 0.2 (*)    GFR calc non Af Amer 38 (*)    GFR calc Af Amer 44 (*)    All other components within normal limits  LIPASE, BLOOD  TROPONIN I  URINALYSIS, ROUTINE W REFLEX MICROSCOPIC    EKG None  Radiology Dg Chest 2 View  Result Date: 01/13/2018 CLINICAL DATA:  History of constipation.  Weakness. EXAM: CHEST - 2 VIEW COMPARISON:  01/06/2018 FINDINGS: There is bilateral mild chronic  interstitial thickening. There is no focal consolidation. There is no pleural effusion or pneumothorax. The heart size is top-normal. There is thoracic aortic atherosclerosis. There are multiple old healed right posterior rib fractures. IMPRESSION: No active cardiopulmonary disease. Electronically Signed   By: Elige KoHetal  Patel   On: 01/13/2018 13:44    Procedures Procedures (including critical care time)  Medications Ordered in ED Medications  iopamidol (ISOVUE-300) 61 % injection (has no administration in time range)  sodium chloride 0.9 % bolus 1,000 mL (0 mLs Intravenous Stopped 01/13/18 1508)  iopamidol (ISOVUE-300) 61 % injection 100 mL (80 mLs Intravenous Contrast Given 01/13/18 1445)  irbesartan (AVAPRO) tablet 75 mg (75 mg Oral Given 01/13/18 1603)     Initial Impression / Assessment and Plan / ED Course  I  have reviewed the triage vital signs and the nursing notes.  Pertinent labs & imaging results that were available during my care of the patient were reviewed by me and considered in my medical decision making (see chart for details).     Patient is overall well-appearing.  No significant abdominal tenderness.  Workup in the emergency department without significant abnormality.  Case management involved.  Long discussion had with patient and family regarding ongoing management at home.  Patient will need home health physical therapy, occupational therapy, social worker.  I do not think she needs repeat admission in the hospital.  I am not concerned about cholecystitis as she has no right upper quadrant tenderness and no white blood cell count.  Discharged home in good condition.  Final Clinical Impressions(s) / ED Diagnoses   Final diagnoses:  None    ED Discharge Orders    None       Azalia Bilis, MD 01/13/18 [phone]

## 2018-01-13 NOTE — ED Triage Notes (Signed)
Patient was recently released from a hospital 5 days ago. Patient has not had a BM in 5 days since being released from the hospital. Family has been giving the patient Miralax daily. Patient 's family also reports hypertension at home.

## 2018-01-18 ENCOUNTER — Encounter: Payer: Self-pay | Admitting: Family Medicine

## 2018-01-19 ENCOUNTER — Encounter (HOSPITAL_BASED_OUTPATIENT_CLINIC_OR_DEPARTMENT_OTHER): Payer: Self-pay

## 2018-01-19 ENCOUNTER — Inpatient Hospital Stay (HOSPITAL_BASED_OUTPATIENT_CLINIC_OR_DEPARTMENT_OTHER)
Admission: EM | Admit: 2018-01-19 | Discharge: 2018-01-24 | DRG: 381 | Disposition: A | Discharge status: Skilled Nursing Facility | Payer: Medicare Other | Attending: Internal Medicine | Admitting: Internal Medicine

## 2018-01-19 ENCOUNTER — Other Ambulatory Visit: Payer: Self-pay

## 2018-01-19 ENCOUNTER — Encounter: Payer: Self-pay | Admitting: Internal Medicine

## 2018-01-19 ENCOUNTER — Ambulatory Visit: Payer: Medicare Other | Admitting: Internal Medicine

## 2018-01-19 ENCOUNTER — Emergency Department (HOSPITAL_BASED_OUTPATIENT_CLINIC_OR_DEPARTMENT_OTHER): Payer: Medicare Other

## 2018-01-19 ENCOUNTER — Ambulatory Visit (INDEPENDENT_AMBULATORY_CARE_PROVIDER_SITE_OTHER): Payer: Medicare Other | Admitting: Internal Medicine

## 2018-01-19 VITALS — BP 116/74 | HR 85 | Temp 97.9°F | Resp 16 | Wt 150.0 lb

## 2018-01-19 DIAGNOSIS — K219 Gastro-esophageal reflux disease without esophagitis: Secondary | ICD-10-CM | POA: Diagnosis present

## 2018-01-19 DIAGNOSIS — K8020 Calculus of gallbladder without cholecystitis without obstruction: Secondary | ICD-10-CM | POA: Diagnosis present

## 2018-01-19 DIAGNOSIS — R531 Weakness: Secondary | ICD-10-CM

## 2018-01-19 DIAGNOSIS — E860 Dehydration: Secondary | ICD-10-CM | POA: Diagnosis not present

## 2018-01-19 DIAGNOSIS — K449 Diaphragmatic hernia without obstruction or gangrene: Secondary | ICD-10-CM | POA: Diagnosis present

## 2018-01-19 DIAGNOSIS — Z66 Do not resuscitate: Secondary | ICD-10-CM | POA: Diagnosis present

## 2018-01-19 DIAGNOSIS — K2210 Ulcer of esophagus without bleeding: Secondary | ICD-10-CM | POA: Diagnosis present

## 2018-01-19 DIAGNOSIS — Z7982 Long term (current) use of aspirin: Secondary | ICD-10-CM

## 2018-01-19 DIAGNOSIS — Z882 Allergy status to sulfonamides status: Secondary | ICD-10-CM | POA: Diagnosis not present

## 2018-01-19 DIAGNOSIS — Z79899 Other long term (current) drug therapy: Secondary | ICD-10-CM | POA: Diagnosis not present

## 2018-01-19 DIAGNOSIS — R112 Nausea with vomiting, unspecified: Secondary | ICD-10-CM | POA: Diagnosis not present

## 2018-01-19 DIAGNOSIS — Z87440 Personal history of urinary (tract) infections: Secondary | ICD-10-CM

## 2018-01-19 DIAGNOSIS — H409 Unspecified glaucoma: Secondary | ICD-10-CM | POA: Diagnosis present

## 2018-01-19 DIAGNOSIS — K5909 Other constipation: Secondary | ICD-10-CM | POA: Diagnosis present

## 2018-01-19 DIAGNOSIS — Z881 Allergy status to other antibiotic agents status: Secondary | ICD-10-CM

## 2018-01-19 DIAGNOSIS — K210 Gastro-esophageal reflux disease with esophagitis: Secondary | ICD-10-CM | POA: Diagnosis present

## 2018-01-19 DIAGNOSIS — E785 Hyperlipidemia, unspecified: Secondary | ICD-10-CM | POA: Diagnosis present

## 2018-01-19 DIAGNOSIS — I10 Essential (primary) hypertension: Secondary | ICD-10-CM | POA: Diagnosis present

## 2018-01-19 DIAGNOSIS — R1310 Dysphagia, unspecified: Secondary | ICD-10-CM | POA: Diagnosis not present

## 2018-01-19 DIAGNOSIS — R1084 Generalized abdominal pain: Secondary | ICD-10-CM | POA: Diagnosis not present

## 2018-01-19 DIAGNOSIS — R109 Unspecified abdominal pain: Secondary | ICD-10-CM | POA: Diagnosis present

## 2018-01-19 DIAGNOSIS — N390 Urinary tract infection, site not specified: Secondary | ICD-10-CM | POA: Diagnosis present

## 2018-01-19 DIAGNOSIS — R1010 Upper abdominal pain, unspecified: Secondary | ICD-10-CM

## 2018-01-19 LAB — COMPREHENSIVE METABOLIC PANEL
ALK PHOS: 69 U/L (ref 38–126)
ALT: 11 U/L — AB (ref 14–54)
AST: 22 U/L (ref 15–41)
Albumin: 3.4 g/dL — ABNORMAL LOW (ref 3.5–5.0)
Anion gap: 11 (ref 5–15)
BILIRUBIN TOTAL: 0.5 mg/dL (ref 0.3–1.2)
BUN: 26 mg/dL — ABNORMAL HIGH (ref 6–20)
CALCIUM: 9.3 mg/dL (ref 8.9–10.3)
CO2: 20 mmol/L — ABNORMAL LOW (ref 22–32)
CREATININE: 1.22 mg/dL — AB (ref 0.44–1.00)
Chloride: 107 mmol/L (ref 101–111)
GFR, EST AFRICAN AMERICAN: 42 mL/min — AB (ref >60)
GFR, EST NON AFRICAN AMERICAN: 36 mL/min — AB (ref >60)
Glucose, Bld: 102 mg/dL — ABNORMAL HIGH (ref 65–99)
Potassium: 3.9 mmol/L (ref 3.5–5.1)
Sodium: 138 mmol/L (ref 135–145)
TOTAL PROTEIN: 7 g/dL (ref 6.5–8.1)

## 2018-01-19 LAB — URINALYSIS, ROUTINE W REFLEX MICROSCOPIC
Bilirubin Urine: NEGATIVE
Glucose, UA: NEGATIVE mg/dL
Ketones, ur: 20 mg/dL — AB
Nitrite: NEGATIVE
PH: 5 (ref 5.0–8.0)
Protein, ur: NEGATIVE mg/dL
SPECIFIC GRAVITY, URINE: 1.019 (ref 1.005–1.030)

## 2018-01-19 LAB — CBC
HCT: 44.8 % (ref 36.0–46.0)
HEMOGLOBIN: 14.4 g/dL (ref 12.0–15.0)
MCH: 28.3 pg (ref 26.0–34.0)
MCHC: 32.1 g/dL (ref 30.0–36.0)
MCV: 88.2 fL (ref 78.0–100.0)
Platelets: 250 10*3/uL (ref 150–400)
RBC: 5.08 MIL/uL (ref 3.87–5.11)
RDW: 17.3 % — ABNORMAL HIGH (ref 11.5–15.5)
WBC: 11.3 10*3/uL — ABNORMAL HIGH (ref 4.0–10.5)

## 2018-01-19 LAB — LIPASE, BLOOD: LIPASE: 24 U/L (ref 11–51)

## 2018-01-19 LAB — TROPONIN I: Troponin I: <0.03 ng/mL (ref <0.03)

## 2018-01-19 MED ORDER — SODIUM CHLORIDE 0.9 % IV BOLUS
1000.0000 mL | Freq: Once | INTRAVENOUS | Status: AC
  Administered 2018-01-19: 1000 mL via INTRAVENOUS

## 2018-01-19 MED ORDER — ASPIRIN EC 81 MG PO TBEC
81.0000 mg | DELAYED_RELEASE_TABLET | Freq: Every day | ORAL | Status: DC
  Administered 2018-01-20: 81 mg via ORAL
  Filled 2018-01-19: qty 1

## 2018-01-19 MED ORDER — ACETAMINOPHEN 650 MG RE SUPP: 650.0000 mg | Freq: Four times a day (QID) | RECTAL | Status: DC | PRN

## 2018-01-19 MED ORDER — SODIUM CHLORIDE 0.9 % IV SOLN
INTRAVENOUS | Status: DC
  Administered 2018-01-19: 19:00:00 via INTRAVENOUS

## 2018-01-19 MED ORDER — ACETAMINOPHEN 325 MG PO TABS
650.0000 mg | ORAL_TABLET | Freq: Four times a day (QID) | ORAL | Status: DC | PRN
  Administered 2018-01-21: 650 mg via ORAL
  Filled 2018-01-19: qty 2

## 2018-01-19 MED ORDER — LATANOPROST 0.005 % OP SOLN
1.0000 [drp] | Freq: Every day | OPHTHALMIC | Status: DC
  Administered 2018-01-19 – 2018-01-23 (×5): 1 [drp] via OPHTHALMIC
  Filled 2018-01-19: qty 2.5

## 2018-01-19 MED ORDER — ONDANSETRON HCL 4 MG/2ML IJ SOLN
4.0000 mg | Freq: Four times a day (QID) | INTRAMUSCULAR | Status: DC | PRN
  Administered 2018-01-19: 4 mg via INTRAVENOUS
  Filled 2018-01-19: qty 2

## 2018-01-19 MED ORDER — MORPHINE SULFATE (PF) 2 MG/ML IV SOLN
2.0000 mg | Freq: Once | INTRAVENOUS | Status: AC
Start: 2018-01-19 — End: 2018-01-19
  Administered 2018-01-19: 2 mg via INTRAVENOUS
  Filled 2018-01-19: qty 1

## 2018-01-19 MED ORDER — BRIMONIDINE TARTRATE 0.2 % OP SOLN
1.0000 [drp] | Freq: Three times a day (TID) | OPHTHALMIC | Status: DC
  Administered 2018-01-19 – 2018-01-22 (×8): 1 [drp] via OPHTHALMIC
  Filled 2018-01-19: qty 5

## 2018-01-19 MED ORDER — SUCRALFATE 1 G PO TABS
1.0000 g | ORAL_TABLET | Freq: Two times a day (BID) | ORAL | Status: DC
  Administered 2018-01-19 – 2018-01-20 (×3): 1 g via ORAL
  Filled 2018-01-19 (×3): qty 1

## 2018-01-19 MED ORDER — ENOXAPARIN SODIUM 30 MG/0.3ML ~~LOC~~ SOLN
30.0000 mg | SUBCUTANEOUS | Status: DC
  Administered 2018-01-19 – 2018-01-20 (×2): 30 mg via SUBCUTANEOUS
  Filled 2018-01-19 (×2): qty 0.3

## 2018-01-19 MED ORDER — ONDANSETRON HCL 4 MG/2ML IJ SOLN
4.0000 mg | Freq: Once | INTRAMUSCULAR | Status: AC
  Administered 2018-01-19: 4 mg via INTRAVENOUS
  Filled 2018-01-19: qty 2

## 2018-01-19 MED ORDER — IRBESARTAN 75 MG PO TABS
37.5000 mg | ORAL_TABLET | Freq: Every day | ORAL | Status: DC
  Administered 2018-01-19 – 2018-01-23 (×5): 37.5 mg via ORAL
  Filled 2018-01-19 (×5): qty 1

## 2018-01-19 MED ORDER — BRINZOLAMIDE 1 % OP SUSP
1.0000 [drp] | Freq: Three times a day (TID) | OPHTHALMIC | Status: DC
  Administered 2018-01-19 – 2018-01-22 (×8): 1 [drp] via OPHTHALMIC
  Filled 2018-01-19: qty 10

## 2018-01-19 MED ORDER — POLYETHYLENE GLYCOL 3350 17 G PO PACK
17.0000 g | PACK | Freq: Every day | ORAL | Status: DC
  Administered 2018-01-21 – 2018-01-24 (×4): 17 g via ORAL
  Filled 2018-01-19 (×4): qty 1

## 2018-01-19 MED ORDER — SODIUM CHLORIDE 0.9 % IV SOLN
INTRAVENOUS | Status: AC
  Administered 2018-01-19: 1000 mL via INTRAVENOUS

## 2018-01-19 MED ORDER — PANTOPRAZOLE SODIUM 40 MG IV SOLR
40.0000 mg | Freq: Two times a day (BID) | INTRAVENOUS | Status: DC
  Administered 2018-01-19 – 2018-01-20 (×3): 40 mg via INTRAVENOUS
  Filled 2018-01-19 (×3): qty 40

## 2018-01-19 MED ORDER — BRINZOLAMIDE-BRIMONIDINE 1-0.2 % OP SUSP: 1.0000 [drp] | Freq: Three times a day (TID) | OPHTHALMIC | Status: DC

## 2018-01-19 MED ORDER — HYDRALAZINE HCL 20 MG/ML IJ SOLN
10.0000 mg | Freq: Four times a day (QID) | INTRAMUSCULAR | Status: DC | PRN
  Administered 2018-01-21: 10 mg via INTRAVENOUS
  Filled 2018-01-19 (×2): qty 1

## 2018-01-19 MED ORDER — LACTULOSE 10 GM/15ML PO SOLN
10.0000 g | Freq: Every day | ORAL | Status: DC
  Administered 2018-01-19 – 2018-01-20 (×2): 10 g via ORAL
  Filled 2018-01-19 (×2): qty 15

## 2018-01-19 MED ORDER — TRAVOPROST 0.004 % OP SOLN: 1.0000 [drp] | Freq: Every day | OPHTHALMIC | Status: DC

## 2018-01-19 NOTE — Progress Notes (Signed)
Pre visit review using our clinic review tool, if applicable. No additional management support is needed unless otherwise documented below in the visit note. 

## 2018-01-19 NOTE — Progress Notes (Signed)
Patient is a 84 year old female history of constipation, gastroesophageal reflux disease, carotid artery disease, hypertension, hyperlipidemia, history of UTIs recently hospitalized 12/17/2017 to 01/08/2018 with symptomatic cholelithiasis and probable acute/chronic cholecystitis was seen in consultation by general surgery and improved clinically and as such patient was treated conservatively.  Patient also treated for UTI secondary to pseudomonas aeruginosa and improved clinically and subsequently discharged home after patient was seen by PT and family refused home health PT.  Patient was seen back in the ED 2 days post discharge with nausea vomiting abdominal pain noted to be dehydrated placed on IV fluids improved and discharged home.  Since patient has been home patient has been having recurrent episodes of nausea vomiting generalized weakness, decreased oral intake and abdominal pain associated with oral intake.  Patient saw PCP patient was noted to be significantly weak required 2 person assist and subsequently sent to the ED.  Patient seen in the ED comprehensive metabolic profile done LFTs.  Lipase was normal.  CBC with a slight leukocytosis with a white count of 11.3.  Repeat abdominal ultrasound with cholelithiasis with gallbladder wall thickening, right mild upper quadrant tenderness noted.  Admission requested due to significant weakness and concern for biliary colic/symptomatic cholelithiasis.  Accepted to Med[facility].  Will likely need general surgical evaluation as this is patient's second hospitalization within a month and third visit to the hospital.  No charge.

## 2018-01-19 NOTE — H&P (Signed)
TRH H&P   Patient Demographics:    Donna Summers, is a 84 y.o. female  MRN: [id number]   DOB - [birth year]  Admit Date - 01/19/2018  Outpatient Primary MD for the patient is [name], MD  Referring MD/NP/PA:     Outpatient Specialists:     Patient coming from: home[facility]  Chief Complaint  Patient presents with  . Emesis  . Weakness      HPI:    Donna Summers  is a 84 y.o. female, w hypertension, hyperlipidemia, gerd, w recent admission 01/06/2018 for abdominal pain.  Pt was evaluated by surgery at that time and thought surgery not indicated at that time.  Pt has apparently c/o some dysphagia as well as abdominal discomfort and nausea.  Pt denies fever, chills, diarrhea, constipation, brbpr, or black stool.  Pt [facility] due to dysphagia, abdominal discomfort as well as nausea.    In ED.  Ultrasound abdomen IMPRESSION: Cholelithiasis with gallbladder wall thickening. Mild right upper quadrant tenderness is noted.  Lipase 24,  Wbc 11.3, hgb 14.4, Plt 250  Na 138, K 3.9, Bun 25, Creatinine 1.22 Ast 22, Alt 11 Lipase 24 Trop <0.03 Urinalysis wbc tntc, rbc 6-30  Pt will be admitted for w/up of dysphagia, abdominal pain, and UTI.       Review of systems:    In addition to the HPI above, No Fever-chills, No Headache, No changes with Vision or hearing, No problems swallowing food or Liquids, No Chest pain, Cough or Shortness of Breath,  No Blood in stool or Urine, No dysuria, No new skin rashes or bruises, No new joints pains-aches,  No new weakness, tingling, numbness in any extremity, No recent weight gain or loss, No polyuria, polydypsia or polyphagia, No significant Mental Stressors.  A full 10 point Review of Systems was done, except as stated above, all other Review of Systems were negative.   With Past History of  the following :    Past Medical History:  Diagnosis Date  . Constipation   . Constipation   . GERD (gastroesophageal reflux disease)   . Glaucoma   . History of carotid artery disease   . Hyperlipidemia   . Hypertension   . UTI (urinary tract infection)   . Vertigo       Past Surgical History:  Procedure Laterality Date  . UNKNOWN SURGICAL HISTORY        Social History:     Social History   Tobacco Use  . Smoking status: Never Smoker  . Smokeless tobacco: Never Used  Substance Use Topics  . Alcohol use: Not Currently    Frequency: Never     Lives - at home  Mobility - walks by self   Family History :    History reviewed. No pertinent family history. Negative for cholecystitis  Home Medications:   Prior to Admission medications   Medication Sig Start Date End Date Taking? Authorizing Provider  acetaminophen (TYLENOL) 325 MG tablet Take 650 mg by mouth every 6 (six) hours as needed for mild pain or moderate pain.   Yes [provider]  [facility] Apply 1 drop to eye 3 (three) times daily. LEFT EYE   Yes [provider]  cholecalciferol (VITAMIN D) 1000 units tablet Take 1,000 Units by mouth daily.   Yes [provider]  docusate sodium (COLACE) 100 MG capsule Take 100 mg by mouth 2 (two) times daily as needed for mild constipation.   Yes [provider]  irbesartan (AVAPRO) 75 MG tablet Take 37.5 mg by mouth at bedtime. TAKES 1/2 TABLET   Yes [provider]  lactulose (CHRONULAC) 10 GM/15ML solution Take 10 g by mouth daily.   Yes [provider]  meclizine (ANTIVERT) 12.5 MG tablet Take 12.5 mg by mouth 3 (three) times daily as needed for dizziness.   Yes [provider]  polyethylene glycol (MIRALAX / GLYCOLAX) packet Take 17 g by mouth daily. 01/08/18  Yes Randel PiggSilva Zapata, Dorma RussellEdwin, MD  travoprost, benzalkonium, (TRAVATAN) 0.004 % ophthalmic solution Place 1 drop into the left eye at  bedtime.   Yes [provider]  aspirin EC 81 MG tablet Take 81 mg by mouth daily.    [provider]  rosuvastatin (CRESTOR) 10 MG tablet Take 10 mg by mouth daily.    [provider]  sucralfate (CARAFATE) 1 g tablet Take 1 g by mouth 2 (two) times daily.    [provider]     Allergies:     Allergies  Allergen Reactions  . Bactrim [Sulfamethoxazole-Trimethoprim] Other (See Comments)    unknown  . Levaquin [Levofloxacin] Other (See Comments)    unknown     Physical Exam:   Vitals  Blood pressure (!) 208/57, pulse 66, resp. rate 18, height 5\' 3"  (1.6 m), weight 68 kg (150 lb), SpO2 93 %.   1. General  lying in bed in NAD,    2. Normal affect and insight, Not Suicidal or Homicidal, Awake Alert, Oriented X 3.  3. No F.N deficits, ALL C.Nerves Intact, Strength 5/5 all 4 extremities, Sensation intact all 4 extremities, Plantars down going.  4. Ears and Eyes appear Normal, Conjunctivae clear, PERRLA. Moist Oral Mucosa.  5. Supple Neck, No JVD, No cervical lymphadenopathy appriciated, No Carotid Bruits.  6. Symmetrical Chest wall movement, Good air movement bilaterally, CTAB.  7. RRR, No Gallops, Rubs or Murmurs, No Parasternal Heave.  8. Positive Bowel Sounds, Abdomen Soft, No tenderness, No organomegaly appriciated,No rebound -guarding or rigidity.  9.  No Cyanosis, Normal Skin Turgor, No Skin Rash or Bruise.  10. Good muscle tone,  joints appear normal , no effusions, Normal ROM.  11. No Palpable Lymph Nodes in Neck or Axillae Negative murphy sign   Data Review:    CBC Recent Labs  Lab 01/13/18 1330 01/19/18 1234  WBC 7.9 11.3*  HGB 13.8 14.4  HCT 42.2 44.8  PLT 268 250  MCV 89.4 88.2  MCH 29.2 28.3  MCHC 32.7 32.1  RDW 16.2* 17.3*   ------------------------------------------------------------------------------------------------------------------  Chemistries  Recent Labs  Lab 01/13/18 1330 01/19/18 1234  NA 145  138  K 4.3 3.9  CL 112* 107  CO2 27 20*  GLUCOSE 126* 102*  BUN 23* 26*  CREATININE 1.19* 1.22*  CALCIUM 9.4 9.3  AST 22 22  ALT  14 11*  ALKPHOS 66 69  BILITOT 0.2* 0.5   ------------------------------------------------------------------------------------------------------------------ estimated creatinine clearance is 25.5 mL/min (A) (by C-G formula based on SCr of 1.22 mg/dL (H)). ------------------------------------------------------------------------------------------------------------------ No results for input(s): TSH, T4TOTAL, T3FREE, THYROIDAB in the last 72 hours.  Invalid input(s): FREET3  Coagulation profile No results for input(s): INR, PROTIME in the last 168 hours. ------------------------------------------------------------------------------------------------------------------- No results for input(s): DDIMER in the last 72 hours. -------------------------------------------------------------------------------------------------------------------  Cardiac Enzymes Recent Labs  Lab 01/13/18 1330 01/19/18 1237  TROPONINI <0.03 <0.03   ------------------------------------------------------------------------------------------------------------------ No results found for: BNP   ---------------------------------------------------------------------------------------------------------------  Urinalysis    Component Value Date/Time   COLORURINE YELLOW 01/13/2018 1624   APPEARANCEUR CLEAR 01/13/2018 1624   LABSPEC 1.019 01/13/2018 1624   PHURINE 7.0 01/13/2018 1624   GLUCOSEU NEGATIVE 01/13/2018 1624   HGBUR NEGATIVE 01/13/2018 1624   BILIRUBINUR NEGATIVE 01/13/2018 1624   KETONESUR NEGATIVE 01/13/2018 1624   PROTEINUR NEGATIVE 01/13/2018 1624   NITRITE NEGATIVE 01/13/2018 1624   LEUKOCYTESUR MODERATE (A) 01/13/2018 1624    ----------------------------------------------------------------------------------------------------------------   Imaging Results:     US Abdomen Limited  Result Date: 01/19/2018 CLINICAL DATA:  Abdominal pain and vomiting EXAM: ULTRASOUND ABDOMEN LIMITED RIGHT UPPER QUADRANT COMPARISON:  01/13/2018 FINDINGS: Gallbladder: Cholelithiasis is again identified. Mild gallbladder wall thickening to 3.5 mm is noted. Mild tenderness is noted over the right upper quadrant. Common bile duct: Diameter: 7.2 mm. This is within normal limits for the patient's given age. Liver: No focal lesion identified. Within normal limits in parenchymal echogenicity. Portal vein is patent on color Doppler imaging with normal direction of blood flow towards the liver. Small right pleural effusion is noted. IMPRESSION: Cholelithiasis with gallbladder wall thickening. Mild right upper quadrant tenderness is noted. Electronically Signed   By: Alcide Clever M.D.   On: 01/19/2018 14:02       Assessment & Plan:    Active Problems:   Symptomatic cholelithiasis    Dysphagia NPO Please consult GI in am  Abdominal pain NPO  protonix 40mg  iv bid Hydrate with NS iv Please consult GI in am  Nausea Zofran 4mg  iv q6h prn   UTI Awaiting urine culture Rocephin 1gm iv qday  Gerd Carafate   Hypertension Cont Avapro    DVT Prophylaxis Lovenox - SCDs   AM Labs Ordered, also please review Full Orders  Family Communication: Admission, patients condition and plan of care including tests being ordered have been discussed with the patient  who indicate understanding and agree with the plan and Code Status.  Code Status DNR  Likely DC to  home  Condition GUARDED   Consults called:   None, please consult GI in am  Admission status: inpatient  Time spent in minutes : 45   Pearson Grippe M.D on 01/19/2018 at 7:35 PM  Between 7am to 7pm - Pager - [phone]  . After 7pm go to [email or web address] - password [email or web address]  [facility] - Office  [phone]

## 2018-01-19 NOTE — Progress Notes (Signed)
Pt has 2 sons in the room at the time of the nursing admission history. 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 This information was passed on to pt's primary rn. Briscoe Burns. Carleane Jann Ra BSN, RN-BC Admissions RN 01/19/2018 7:37 PM

## 2018-01-19 NOTE — Patient Instructions (Addendum)
  Please go downstairs, I already spoke with the ER doctor

## 2018-01-19 NOTE — ED Triage Notes (Signed)
Pt c/o CP that PCP feels is r/t to GB-pt sent from PCP office-has been seen x 3 for same-pt NAD-to triage in w/c

## 2018-01-19 NOTE — Progress Notes (Signed)
Subjective:    Patient ID: Donna Summers, female    DOB: May 08, 1920, 84 y.o.   MRN: [id number]  DOS:  01/19/2018 Type of visit - description : New patient Interval history:  Admitted 01/06/2018, discharged 2 days later  Prior to this ER evaluation, she was seen by a local ER where she resides at [facility], dx w/ GB stones, saw a Biomedical engineer,  was recommended to MRCP. She did not complete that evaluation, decided to come to [facility] to get further medical care.  Was admitted to our hospital w/  abdominal pain, she was noted to have a Foley catheter. Urinalysis was grossly abnormal, ultrasound of the abdomen suggested chronic cholecystitis. Surgery was consulted, by that time she was asymptomatic. She tolerated p.o. without recurrence of symptoms. Surgery recommended follow-up as an outpatient. Urine culture shows Pseudomonas, the hospital team discussed with ID, they recommend no treatment, I suspect was likely a colonization. Foley was removed prior to d/c home  Had another ER visit  01-13-18: At home, BP was in the 240s, she was feeling poorly, confused, had headache.  Went to the ER, got IV fluids and felt better.  She is here today because since she left the hospital she continued to have on and off abdominal pain with either fluids or solids. In the last 48 hours she has not been able to keep anything down except two crackers.  Any p.o. intake cause of nausea and some vomiting.  She has chronic constipation, 2 days ago had a large BM after MiraLAX and since then she had a couple of 2 small BMs. No fever chills.   Review of Systems   Past Medical History:  Diagnosis Date  . Constipation   . Constipation   . GERD (gastroesophageal reflux disease)   . Glaucoma   . History of carotid artery disease   . Hyperlipidemia   . Hypertension   . UTI (urinary tract infection)   . Vertigo     Past Surgical History:  Procedure Laterality Date  . UNKNOWN SURGICAL HISTORY       Social History   Socioeconomic History  . Marital status: Widowed    Spouse name: Not on file  . Number of children: Not on file  . Years of education: Not on file  . Highest education level: Not on file  Occupational History  . Not on file  Social Needs  . Financial resource strain: Not on file  . Food insecurity:    Worry: Not on file    Inability: Not on file  . Transportation needs:    Medical: Not on file    Non-medical: Not on file  Tobacco Use  . Smoking status: Never Smoker  . Smokeless tobacco: Never Used  Substance and Sexual Activity  . Alcohol use: Not Currently    Frequency: Never  . Drug use: Never  . Sexual activity: Not Currently  Lifestyle  . Physical activity:    Days per week: Not on file    Minutes per session: Not on file  . Stress: Not on file  Relationships  . Social connections:    Talks on phone: Not on file    Gets together: Not on file    Attends religious service: Not on file    Active member of club or organization: Not on file    Attends meetings of clubs or organizations: Not on file    Relationship status: Not on file  . Intimate partner violence:  Fear of current or ex partner: Not on file    Emotionally abused: Not on file    Physically abused: Not on file    Forced sexual activity: Not on file  Other Topics Concern  . Not on file  Social History Narrative  . Not on file     No family history on file.  Allergies as of 01/19/2018      Reactions   Bactrim [sulfamethoxazole-trimethoprim] Other (See Comments)   unknown   Levaquin [levofloxacin] Other (See Comments)   unknown      Medication List        Accurate as of 01/19/18 12:00 PM. Always use your most recent med list.          acetaminophen 325 MG tablet Commonly known as:  TYLENOL Take 650 mg by mouth every 6 (six) hours as needed for mild pain or moderate pain.   aspirin EC 81 MG tablet Take 81 mg by mouth daily.   BRINZOLAMIDE-BRIMONIDINE OP Apply 1  drop to eye 3 (three) times daily. LEFT EYE   cholecalciferol 1000 units tablet Commonly known as:  VITAMIN D Take 1,000 Units by mouth daily.   docusate sodium 100 MG capsule Commonly known as:  COLACE Take 100 mg by mouth 2 (two) times daily as needed for mild constipation.   irbesartan 75 MG tablet Commonly known as:  AVAPRO Take 37.5 mg by mouth at bedtime. TAKES 1/2 TABLET   lactulose 10 GM/15ML solution Commonly known as:  CHRONULAC Take 10 g by mouth daily.   meclizine 12.5 MG tablet Commonly known as:  ANTIVERT Take 12.5 mg by mouth 3 (three) times daily as needed for dizziness.   polyethylene glycol packet Commonly known as:  MIRALAX / GLYCOLAX Take 17 g by mouth daily.   rosuvastatin 10 MG tablet Commonly known as:  CRESTOR Take 10 mg by mouth daily.   sucralfate 1 g tablet Commonly known as:  CARAFATE Take 1 g by mouth 2 (two) times daily.   travoprost (benzalkonium) 0.004 % ophthalmic solution Commonly known as:  TRAVATAN Place 1 drop into the left eye at bedtime.          Objective:   Physical Exam BP 116/74 (BP Location: Left Arm, Patient Position: Sitting, Cuff Size: Small)   Pulse 85   Temp 97.9 F (36.6 C) (Oral)   Resp 16   Wt 150 lb (68 kg)   SpO2 95%   BMI 26.57 kg/m  General:   Well developed, elderly lady, no acute distress, sitting quietly on a wheelchair, sleepy but arousable HEENT:  Normocephalic . Face symmetric, atraumatic Lungs:  Decreased breath sounds Normal respiratory effort, no intercostal retractions, no accessory muscle use. Heart: RRR,  no murmur.  no pretibial edema bilaterally  Abdomen:  Some performed while the patient is seated , abdomen is not distended, slightly tender at the upper abdomen without mass or rebound. Skin: Not pale. Not jaundice Neurologic:  alert & oriented to self, time and place Speech normal, gait not tested Psych--  No anxious or depressed appearing.     Assessment & Plan:    Assessment Hypertension Hyperlipidemia GERD Dementia ? ("confussion on-off") Recurrent UTIs, had Foley catheter   PLAN: Symptomatic cholelithiasis: Since the admission to the hospital 01/06/2018 with cholelithiasis, she continued to have on and off nausea and abdominal pain with p.o. intake.  In the last 48 hours, she has not been able to keep fluids down and has been vomiting.  Although she  is not hypotensive she looks debilitated and needs further evaluation & treatment in the short-term. Likely she needs to be admitted, see surgery again and develop a  plan for her cholelithiasis which could include simply observation and symptomatic treatment. She also complained of chest pain for several days, worse when she eats, probably GI in nature.  In the long run, she is unlikely to be able to go back to [facility] where she used to live.  The family do not feel that she is able to go back home at this point, I agree with them, she looks very debilitated and transportation is very difficult. I would recommend placing in a nursing home after this admission based on her recent history.  Today, I spent more than 35   min with the patient: >50% of the time counseling regards short and long term plan for the pt, multiple questions answer. Also: -reviewing the chart and labs ordered by other providers  -coordinating his care

## 2018-01-19 NOTE — ED Provider Notes (Addendum)
[facility] EMERGENCY DEPARTMENT Provider Note   CSN: [id number] Arrival date & time: 01/19/18  1158     History   Chief Complaint Chief Complaint  Patient presents with  . Emesis  . Weakness    HPI Donna Summers is a 84 y.o. female.  Patient w hx gallstones, presents with recurrent upper abd pain and nausea/vomiting in past few days. Was recently hospitalized with same, ?dx chronic cholecystitis and gallstones then. Patient in ED last week w recurrent nv - ct then was largely unchanged from prior imaging. Patient improved in ED w iv fluids, but since d/c with recurrent pain and nv. Pt generally weak. Pt limited historian - level 5 caveat. Hx largely from son. No fever or chills. At baseline hx chronic constipation, with bm q 4-5 days. Pts last bm was 2 days ago. Pt denies gu c/o other than going less than normal.  No chest pain or discomfort.   Family concerned as pt not tolerating po intake, and getting weaker and weaker.   The history is provided by the patient.  Chest Pain   Associated symptoms include abdominal pain, nausea and vomiting. Pertinent negatives include no cough, no fever, no headaches and no shortness of breath.    Past Medical History:  Diagnosis Date  . Constipation   . Constipation   . GERD (gastroesophageal reflux disease)   . Glaucoma   . History of carotid artery disease   . Hyperlipidemia   . Hypertension   . UTI (urinary tract infection)   . Vertigo     Patient Active Problem List   Diagnosis Date Noted  . Cholecystitis 01/06/2018  . Constipation 01/06/2018  . History of urinary retention 01/06/2018  . Abdominal pain 01/06/2018  . Vitamin D deficiency 01/06/2018  . HLD (hyperlipidemia) 01/06/2018  . GERD (gastroesophageal reflux disease) 01/06/2018  . HTN (hypertension) 01/06/2018    Past Surgical History:  Procedure Laterality Date  . UNKNOWN SURGICAL HISTORY       OB History   None      Home Medications    Prior  to Admission medications   Medication Sig Start Date End Date Taking? Authorizing Provider  acetaminophen (TYLENOL) 325 MG tablet Take 650 mg by mouth every 6 (six) hours as needed for mild pain or moderate pain.    [provider]  aspirin EC 81 MG tablet Take 81 mg by mouth daily.    [provider]  [facility] OP Apply 1 drop to eye 3 (three) times daily. LEFT EYE    [provider]  cholecalciferol (VITAMIN D) 1000 units tablet Take 1,000 Units by mouth daily.    [provider]  docusate sodium (COLACE) 100 MG capsule Take 100 mg by mouth 2 (two) times daily as needed for mild constipation.    [provider]  irbesartan (AVAPRO) 75 MG tablet Take 37.5 mg by mouth at bedtime. TAKES 1/2 TABLET    [provider]  lactulose (CHRONULAC) 10 GM/15ML solution Take 10 g by mouth daily.    [provider]  meclizine (ANTIVERT) 12.5 MG tablet Take 12.5 mg by mouth 3 (three) times daily as needed for dizziness.    [provider]  polyethylene glycol (MIRALAX / GLYCOLAX) packet Take 17 g by mouth daily. 01/08/18   Lenox Ponds, MD  rosuvastatin (CRESTOR) 10 MG tablet Take 10 mg by mouth daily.    [provider]  sucralfate (CARAFATE) 1 g tablet Take 1 g  by mouth 2 (two) times daily.    [provider]  travoprost, benzalkonium, (TRAVATAN) 0.004 % ophthalmic solution Place 1 drop into the left eye at bedtime.    [provider]    Family History No family history on file.  Social History Social History   Tobacco Use  . Smoking status: Never Smoker  . Smokeless tobacco: Never Used  Substance Use Topics  . Alcohol use: Not Currently    Frequency: Never  . Drug use: Never     Allergies   Bactrim [sulfamethoxazole-trimethoprim] and Levaquin [levofloxacin]   Review of Systems Review of Systems  Constitutional: Negative for fever.  HENT: Negative for sore throat.   Eyes:  Negative for redness.  Respiratory: Negative for cough and shortness of breath.   Cardiovascular: Negative for chest pain.  Gastrointestinal: Positive for abdominal pain, nausea and vomiting.  Genitourinary: Negative for dysuria and flank pain.  Musculoskeletal: Negative for neck pain.  Skin: Negative for rash.  Neurological: Negative for headaches.  Hematological: Does not bruise/bleed easily.  Psychiatric/Behavioral: The patient is not nervous/anxious.      Physical Exam Updated Vital Signs Ht 1.6 m (5\' 3" )   Wt 68 kg (150 lb)   BMI 26.57 kg/m   Physical Exam  Constitutional: She appears well-developed. No distress.  HENT:  Mouth/Throat: Oropharynx is clear and moist.  Eyes: Conjunctivae are normal. No scleral icterus.  Neck: Neck supple. No tracheal deviation present.  Cardiovascular: Normal rate, regular rhythm, normal heart sounds and intact distal pulses.  Pulmonary/Chest: Effort normal and breath sounds normal. No respiratory distress.  Abdominal: Soft. Normal appearance and bowel sounds are normal. She exhibits no distension. There is tenderness.  Mid to upper abd tenderness.   Genitourinary:  Genitourinary Comments: No cva tenderness  Musculoskeletal: She exhibits no edema.  Neurological: She is alert.  Skin: Skin is warm and dry. No rash noted. She is not diaphoretic.  Psychiatric: She has a normal mood and affect.  Nursing note and vitals reviewed.    ED Treatments / Results  Labs (all labs ordered are listed, but only abnormal results are displayed) Results for orders placed or performed during the hospital encounter of 01/19/18  CBC  Result Value Ref Range   WBC 11.3 (H) 4.0 - 10.5 K/uL   RBC 5.08 3.87 - 5.11 MIL/uL   Hemoglobin 14.4 12.0 - 15.0 g/dL   HCT 16.1 09.6 - 04.5 %   MCV 88.2 78.0 - 100.0 fL   MCH 28.3 26.0 - 34.0 pg   MCHC 32.1 30.0 - 36.0 g/dL   RDW 40.9 (H) 81.1 - 91.4 %   Platelets 250 150 - 400 K/uL  Comprehensive metabolic panel    Result Value Ref Range   Sodium 138 135 - 145 mmol/L   Potassium 3.9 3.5 - 5.1 mmol/L   Chloride 107 101 - 111 mmol/L   CO2 20 (L) 22 - 32 mmol/L   Glucose, Bld 102 (H) 65 - 99 mg/dL   BUN 26 (H) 6 - 20 mg/dL   Creatinine, Ser 7.82 (H) 0.44 - 1.00 mg/dL   Calcium 9.3 8.9 - 95.6 mg/dL   Total Protein 7.0 6.5 - 8.1 g/dL   Albumin 3.4 (L) 3.5 - 5.0 g/dL   AST 22 15 - 41 U/L   ALT 11 (L) 14 - 54 U/L   Alkaline Phosphatase 69 38 - 126 U/L   Total Bilirubin 0.5 0.3 - 1.2 mg/dL   GFR calc non Af Amer 36 (  L) >60 mL/min   GFR calc Af Amer 42 (L) >60 mL/min   Anion gap 11 5 - 15  Lipase, blood  Result Value Ref Range   Lipase 24 11 - 51 U/L  Troponin I  Result Value Ref Range   Troponin I <0.03 <0.03 ng/mL   Dg Chest 2 View  Result Date: 01/13/2018 CLINICAL DATA:  History of constipation.  Weakness. EXAM: CHEST - 2 VIEW COMPARISON:  01/06/2018 FINDINGS: There is bilateral mild chronic interstitial thickening. There is no focal consolidation. There is no pleural effusion or pneumothorax. The heart size is top-normal. There is thoracic aortic atherosclerosis. There are multiple old healed right posterior rib fractures. IMPRESSION: No active cardiopulmonary disease. Electronically Signed   By: Elige KoHetal  Patel   On: 01/13/2018 13:44   [place] Abdomen Complete  Result Date: 01/06/2018 CLINICAL DATA:  Right upper quadrant abdominal pain for the past week. Nausea. EXAM: ABDOMEN ULTRASOUND COMPLETE COMPARISON:  None. FINDINGS: Gallbladder: Multiple gallstones in the gallbladder, the largest measuring 1.1 cm in maximum diameter. Mild diffuse gallbladder wall thickening with a maximum thickness of 3.4 mm. No pericholecystic fluid. No sonographic Murphy sign. Common bile duct: Diameter: 6.9 mm Liver: No focal lesion identified. Within normal limits in parenchymal echogenicity. Portal vein is patent on color Doppler imaging with normal direction of blood flow towards the liver. IVC: No abnormality visualized.  Pancreas: Visualized portion unremarkable. Spleen: Size and appearance within normal limits. Right Kidney: Length: 8.7 cm. Diffuse cortical thinning. Prominent renal sinus fat. Normal echotexture. No hydronephrosis. Left Kidney: Length: 8.6 cm. Diffuse cortical thinning. Prominent renal sinus fat. Normal echotexture. No hydronephrosis. Abdominal aorta: No aneurysm visualized. Other findings: None. IMPRESSION: 1. Cholelithiasis. 2. Mild diffuse gallbladder wall thickening. This is most likely due to chronic cholecystitis. 3. No biliary ductal dilatation. 4. Diffuse bilateral renal cortical atrophy. Electronically Signed   By: Beckie SaltsSteven  Reid M.D.   On: 01/06/2018 12:22   Ct Abdomen Pelvis W Contrast  Result Date: 01/13/2018 CLINICAL DATA:  Recently released from the hospital 5 days ago, has not had a bowel movement in 5 days since being released despite MiraLax, history hypertension, GERD EXAM: CT ABDOMEN AND PELVIS WITH CONTRAST TECHNIQUE: Multidetector CT imaging of the abdomen and pelvis was performed using the standard protocol following bolus administration of intravenous contrast. Sagittal and coronal MPR images reconstructed from axial data set. CONTRAST:  80mL ISOVUE-300 IOPAMIDOL (ISOVUE-300) INJECTION 61% IV. No oral contrast. COMPARISON:  None; correlation abdominal ultrasound 01/06/2018 FINDINGS: Lower chest: Bibasilar subpleural interstitial changes and minimal basilar atelectasis. Tiny LEFT pleural effusion. Hepatobiliary: Cholelithiasis. Minimal gallbladder wall thickening. No biliary dilatation. Liver unremarkable. Pancreas: Atrophic.  Partial fatty replacement at pancreatic head. Spleen: Normal appearance Adrenals/Urinary Tract: Adrenal glands normal appearance. Tiny RIGHT renal cyst. Kidneys, ureters, and bladder normal appearance Stomach/Bowel: Mildly increased stool throughout colon. Appendix not visualized but no pericecal inflammatory process seen. Moderate to large hiatal hernia. Stomach and  bowel loops otherwise unremarkable. Vascular/Lymphatic: Extensive atherosclerotic calcifications of, iliac arteries and coronary arteries. Aorta normal caliber. No adenopathy. Reproductive: Uterus surgically absent. Nonvisualization of ovaries. Other: No free air or free fluid.  No hernia. Musculoskeletal: Osseous structures unremarkable. IMPRESSION: Cholelithiasis with minimal gallbladder wall thickening unchanged from prior ultrasound, may reflect chronic cholecystitis. Hiatal hernia. Extensive atherosclerotic disease changes including coronary arteries. Mildly increased stool throughout colon. Minimal LEFT pleural effusion, LEFT basilar atelectasis and BILATERAL basilar subpleural interstitial changes. Electronically Signed   By: Ulyses SouthwardMark  Boles M.D.   On: 01/13/2018 15:10  US Abdomen Limited  Result Date: 01/19/2018 CLINICAL DATA:  Abdominal pain and vomiting EXAM: ULTRASOUND ABDOMEN LIMITED RIGHT UPPER QUADRANT COMPARISON:  01/13/2018 FINDINGS: Gallbladder: Cholelithiasis is again identified. Mild gallbladder wall thickening to 3.5 mm is noted. Mild tenderness is noted over the right upper quadrant. Common bile duct: Diameter: 7.2 mm. This is within normal limits for the patient's given age. Liver: No focal lesion identified. Within normal limits in parenchymal echogenicity. Portal vein is patent on color Doppler imaging with normal direction of blood flow towards the liver. Small right pleural effusion is noted. IMPRESSION: Cholelithiasis with gallbladder wall thickening. Mild right upper quadrant tenderness is noted. Electronically Signed   By: Alcide Clever M.D.   On: 01/19/2018 14:02   Dg Abdomen Acute W/chest  Result Date: 01/06/2018 CLINICAL DATA:  Abdominal pain.  Gallstones. EXAM: DG ABDOMEN ACUTE W/ 1V CHEST COMPARISON:  None. FINDINGS: There is no evidence of dilated bowel loops or free intraperitoneal air. No radiopaque calculi or other significant radiographic abnormality is seen. Heart size and  mediastinal contours are within normal limits. Both lungs are clear. Vascular calcification of the aorta. IMPRESSION: Negative abdominal radiographs.  No acute cardiopulmonary disease. Electronically Signed   By: Elsie Stain M.D.   On: 01/06/2018 13:20    EKG EKG Interpretation  Date/Time:  Wednesday January 19 2018 12:14:55 EDT Ventricular Rate:  72 PR Interval:    QRS Duration: 88 QT Interval:  404 QTC Calculation: 443 R Axis:   1 Text Interpretation:  Sinus rhythm Baseline wander in lead(s) II III aVL aVF Confirmed by Cathren Laine ([id number]) on 01/19/2018 12:25:29 PM   Radiology US Abdomen Limited  Result Date: 01/19/2018 CLINICAL DATA:  Abdominal pain and vomiting EXAM: ULTRASOUND ABDOMEN LIMITED RIGHT UPPER QUADRANT COMPARISON:  01/13/2018 FINDINGS: Gallbladder: Cholelithiasis is again identified. Mild gallbladder wall thickening to 3.5 mm is noted. Mild tenderness is noted over the right upper quadrant. Common bile duct: Diameter: 7.2 mm. This is within normal limits for the patient's given age. Liver: No focal lesion identified. Within normal limits in parenchymal echogenicity. Portal vein is patent on color Doppler imaging with normal direction of blood flow towards the liver. Small right pleural effusion is noted. IMPRESSION: Cholelithiasis with gallbladder wall thickening. Mild right upper quadrant tenderness is noted. Electronically Signed   By: Alcide Clever M.D.   On: 01/19/2018 14:02    Procedures Procedures (including critical care time)  Medications Ordered in ED Medications  sodium chloride 0.9 % bolus 1,000 mL (has no administration in time range)  ondansetron (ZOFRAN) injection 4 mg (has no administration in time range)  morphine 2 MG/ML injection 2 mg (has no administration in time range)     Initial Impression / Assessment and Plan / ED Course  I have reviewed the triage vital signs and the nursing notes.  Pertinent labs & imaging results that were available during  my care of the patient were reviewed by me and considered in my medical decision making (see chart for details).  Iv ns bolus. zofran iv. Labs. Imaging.  Reviewed nursing notes and prior charts for additional history.   Additional ns iv.   U/s similar to prior.   ?symptomatic gallstones w recurrent emesis.   Due to inability to tolerate po, weakness/dehydration, will consult hospitalists for admission.  I anticipate pt will also need SW consult, as likely will need long term skilled placement post discharge.   Discussed w hospitalist at [facility]. Discussed w Dr Janee Morn who accepts in admission  to [facility], med surg.   Final Clinical Impressions(s) / ED Diagnoses   Final diagnoses:  None    ED Discharge Orders    None                Cathren Laine, MD 01/19/18 1535

## 2018-01-20 ENCOUNTER — Inpatient Hospital Stay (HOSPITAL_COMMUNITY): Payer: Medicare Other

## 2018-01-20 DIAGNOSIS — E860 Dehydration: Secondary | ICD-10-CM | POA: Diagnosis present

## 2018-01-20 DIAGNOSIS — I10 Essential (primary) hypertension: Secondary | ICD-10-CM

## 2018-01-20 DIAGNOSIS — N390 Urinary tract infection, site not specified: Secondary | ICD-10-CM

## 2018-01-20 DIAGNOSIS — R531 Weakness: Secondary | ICD-10-CM

## 2018-01-20 DIAGNOSIS — R1310 Dysphagia, unspecified: Secondary | ICD-10-CM

## 2018-01-20 DIAGNOSIS — K219 Gastro-esophageal reflux disease without esophagitis: Secondary | ICD-10-CM

## 2018-01-20 LAB — CBC
HEMATOCRIT: 36.3 % (ref 36.0–46.0)
Hemoglobin: 11.4 g/dL — ABNORMAL LOW (ref 12.0–15.0)
MCH: 28.5 pg (ref 26.0–34.0)
MCHC: 31.4 g/dL (ref 30.0–36.0)
MCV: 90.8 fL (ref 78.0–100.0)
Platelets: 226 10*3/uL (ref 150–400)
RBC: 4 MIL/uL (ref 3.87–5.11)
RDW: 16.6 % — ABNORMAL HIGH (ref 11.5–15.5)
WBC: 8.7 10*3/uL (ref 4.0–10.5)

## 2018-01-20 LAB — COMPREHENSIVE METABOLIC PANEL
ALBUMIN: 2.5 g/dL — AB (ref 3.5–5.0)
ALT: 10 U/L — ABNORMAL LOW (ref 14–54)
ANION GAP: 5 (ref 5–15)
AST: 15 U/L (ref 15–41)
Alkaline Phosphatase: 52 U/L (ref 38–126)
BUN: 27 mg/dL — AB (ref 6–20)
CHLORIDE: 112 mmol/L — AB (ref 101–111)
CO2: 23 mmol/L (ref 22–32)
Calcium: 8.4 mg/dL — ABNORMAL LOW (ref 8.9–10.3)
Creatinine, Ser: 1.06 mg/dL — ABNORMAL HIGH (ref 0.44–1.00)
GFR calc Af Amer: 50 mL/min — ABNORMAL LOW (ref >60)
GFR calc non Af Amer: 43 mL/min — ABNORMAL LOW (ref >60)
GLUCOSE: 85 mg/dL (ref 65–99)
POTASSIUM: 3.9 mmol/L (ref 3.5–5.1)
SODIUM: 140 mmol/L (ref 135–145)
Total Bilirubin: 0.7 mg/dL (ref 0.3–1.2)
Total Protein: 5.3 g/dL — ABNORMAL LOW (ref 6.5–8.1)

## 2018-01-20 LAB — GLUCOSE, CAPILLARY: Glucose-Capillary: 84 mg/dL (ref 65–99)

## 2018-01-20 MED ORDER — SODIUM CHLORIDE 0.9 % IV SOLN
1.0000 g | INTRAVENOUS | Status: DC
  Administered 2018-01-20 – 2018-01-23 (×4): 1 g via INTRAVENOUS
  Filled 2018-01-20 (×4): qty 1

## 2018-01-20 MED ORDER — SODIUM CHLORIDE 0.9 % IV SOLN
INTRAVENOUS | Status: AC
  Administered 2018-01-20: 10:00:00 via INTRAVENOUS
  Administered 2018-01-20: 1000 mL via INTRAVENOUS
  Administered 2018-01-21: 75 mL/h via INTRAVENOUS

## 2018-01-20 NOTE — Progress Notes (Signed)
PROGRESS NOTE    SOUL DEVENEY  UJW:[id number] DOB: December 26, 1919 DOA: 01/19/2018 PCP: [name], MD   Brief Narrative:  Donna Summers  is a 84 y.o. female, w hypertension, hyperlipidemia, gerd, w recent admission 01/06/2018 for abdominal pain.  Pt was evaluated by surgery at that time and thought surgery not indicated at that time.  Pt has apparently c/o some dysphagia as well as abdominal discomfort and nausea.  Pt denies fever, chills, diarrhea, constipation, brbpr, or black stool.  Pt presented to ED at [facility] due to dysphagia, abdominal discomfort as well as nausea.    In ED.  Ultrasound abdomen IMPRESSION: Cholelithiasis with gallbladder wall thickening. Mild right upper quadrant tenderness is noted.  Lipase 24,  Wbc 11.3, hgb 14.4, Plt 250  Na 138, K 3.9, Bun 25, Creatinine 1.22 Ast 22, Alt 11 Lipase 24 Trop <0.03 Urinalysis wbc tntc, rbc 6-30  Pt will be admitted for w/up of dysphagia, abdominal pain, and UTI.       Assessment & Plan:   Principal Problem:   Dysphagia Active Problems:   Abdominal pain   HLD (hyperlipidemia)   GERD (gastroesophageal reflux disease)   HTN (hypertension)   Cholelithiasis   Acute lower UTI   Dehydration   Weakness generalized  1 dysphagia Patient with complaints of dysphagia to both solids and liquids that she states she has a feeling as if he gets stuck when she tries to swallow leading to decreased oral intake and generalized weakness.  Patient was seen in consultation by gastroenterology Dr. Randa Evens who was ordered a barium swallow with pill to rule out mechanical obstruction and evaluate for motility disorder.  Keep n.p.o.  Diet advancement per GI and pending barium swallow.  2.  Cholelithiasis Patient denies any significant abdominal pains worrisome for an acute cholecystitis.  LFTs within normal limits.  Abdominal ultrasound done in the ED not significantly changed from prior abdominal ultrasound done during prior  hospitalization.  Will monitor for now with conservative treatment.  3.  UTI Urine cultures pending.  Continue IV Rocephin.  4.  Dehydration IV fluids.  5.  Gastroesophageal reflux disease Continue PPI and sucralfate.  6.  Hypertension Currently stable.  Continue home regimen of Avapro.  7.  Hyperlipidemia Stable.  8.  Generalized weakness Likely secondary to poor oral intake in the setting of dysphasia.  PT/OT.  May need skilled nursing facility on discharge.   DVT prophylaxis: Lovenox Code Status: DNR Family Communication: Updated patient and son at bedside. Disposition Plan: Pending workup and per GI.   Consultants:   Gastroenterology: Dr. Randa Evens 01/20/2018  Procedures:   Barium swallow with tablet pending 01/20/2018  Right upper quadrant abdominal ultrasound 01/19/2018  Antimicrobials:   IV Rocephin for 01/20/2018   Subjective: Patient laying in bed.  No chest pain.  No shortness of breath.  Patient denies any abdominal pain.  No nausea or vomiting.  Patient states prior to admission had difficulty swallowing both solids and liquids and felt it got stuck and then over time went down.  Objective: Vitals:   01/20/18 0500 01/20/18 0527 01/20/18 0632 01/20/18 1406  BP:  (!) 173/52 (!) 148/55 (!) 152/59  Pulse:  68  (!) 115  Resp:  20  18  Temp:  98.3 F (36.8 C)  97.7 F (36.5 C)  TempSrc:  Oral  Oral  SpO2:  96%  93%  Weight: 70.1 kg (154 lb 8.7 oz)  70.1 kg (154 lb 8.7 oz)   Height:  Intake/Output Summary (Last 24 hours) at 01/20/2018 1759 Last data filed at 01/20/2018 1406 Gross per 24 hour  Intake 1395 ml  Output 400 ml  Net 995 ml   Filed Weights   01/19/18 1204 01/20/18 0500 01/20/18 0632  Weight: 68 kg (150 lb) 70.1 kg (154 lb 8.7 oz) 70.1 kg (154 lb 8.7 oz)    Examination:  General exam: Appears calm and comfortable  Respiratory system: Clear to auscultation. Respiratory effort normal. Cardiovascular system: S1 & S2 heard, RRR. No JVD,  murmurs, rubs, gallops or clicks. No pedal edema. Gastrointestinal system: Abdomen is nondistended, soft and nontender. No organomegaly or masses felt. Normal bowel sounds heard. Central nervous system: Alert and oriented. No focal neurological deficits. Extremities: Symmetric 5 x 5 power. Skin: No rashes, lesions or ulcers Psychiatry: Judgement and insight appear normal. Mood & affect appropriate.     Data Reviewed: I have personally reviewed following labs and imaging studies  CBC: Recent Labs  Lab 01/19/18 1234 01/20/18 0445  WBC 11.3* 8.7  HGB 14.4 11.4*  HCT 44.8 36.3  MCV 88.2 90.8  PLT 250 226   Basic Metabolic Panel: Recent Labs  Lab 01/19/18 1234 01/20/18 0445  NA 138 140  K 3.9 3.9  CL 107 112*  CO2 20* 23  GLUCOSE 102* 85  BUN 26* 27*  CREATININE 1.22* 1.06*  CALCIUM 9.3 8.4*   GFR: Estimated Creatinine Clearance: 29.8 mL/min (A) (by C-G formula based on SCr of 1.06 mg/dL (H)). Liver Function Tests: Recent Labs  Lab 01/19/18 1234 01/20/18 0445  AST 22 15  ALT 11* 10*  ALKPHOS 69 52  BILITOT 0.5 0.7  PROT 7.0 5.3*  ALBUMIN 3.4* 2.5*   Recent Labs  Lab 01/19/18 1234  LIPASE 24   No results for input(s): AMMONIA in the last 168 hours. Coagulation Profile: No results for input(s): INR, PROTIME in the last 168 hours. Cardiac Enzymes: Recent Labs  Lab 01/19/18 1237  TROPONINI <0.03   BNP (last 3 results) No results for input(s): PROBNP in the last 8760 hours. HbA1C: No results for input(s): HGBA1C in the last 72 hours. CBG: Recent Labs  Lab 01/20/18 0739  GLUCAP 84   Lipid Profile: No results for input(s): CHOL, HDL, LDLCALC, TRIG, CHOLHDL, LDLDIRECT in the last 72 hours. Thyroid Function Tests: No results for input(s): TSH, T4TOTAL, FREET4, T3FREE, THYROIDAB in the last 72 hours. Anemia Panel: No results for input(s): VITAMINB12, FOLATE, FERRITIN, TIBC, IRON, RETICCTPCT in the last 72 hours. Sepsis Labs: No results for input(s):  PROCALCITON, LATICACIDVEN in the last 168 hours.  No results found for this or any previous visit (from the past 240 hour(s)).       Radiology Studies: Dg Esophagus  Result Date: 01/20/2018 CLINICAL DATA:  84 year old female with dysphagia nausea and vomiting. EXAM: ESOPHOGRAM/BARIUM SWALLOW TECHNIQUE: Single contrast examination was performed using thin barium and a 12.5 millimeter barium tablet. FLUOROSCOPY TIME:  Fluoroscopy Time:  2 minutes 6 seconds Radiation Exposure Index (if provided by the fluoroscopic device): 27.9 mGy Number of Acquired Spot Images: 0 COMPARISON:  CT Abdomen and Pelvis 01/13/2018 FINDINGS: A single contrast study was undertaken with head of the fluoroscopy table inclined at 30 degrees, and the patient tolerated this well and without difficulty. No obstruction to the forward flow of contrast throughout the esophagus and into the stomach. Decreased esophageal motility, but no tertiary contractions occurred. Normal esophageal course and contour. Prompt transit of contrast into the intraabdominal stomach (series 1), however, a small  hiatal hernia became apparent over the course of the study. A persistently narrow it appearance of the proximal gastric lumen at the level of the esophageal hiatus was noted throughout the exam (series 3, image 57 and also series 14, image 50), but appears to be benign and non malignant. Similar benign compression of gastric cardia noted on the recent CT Abdomen and Pelvis. This narrowing did not allow passage of A 12.5 mm barium tablet, which otherwise passed freely to the distal esophagus and into the small hiatal hernia without delay. A small volume of contrast was maintained in the distal esophagus and hiatal hernia. Prompt gastric emptying was noted. At the conclusion of the study contrast had reached the proximal jejunum. Incidental 2 centimeter diverticulum of the distal duodenum (series 19). IMPRESSION: 1. 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 2.  Small gastric hiatal hernia which results in extrinsic mass effect and narrowing on the lumen of the stomach through the esophageal hiatus. This appearance also visible on the recent CT Abdomen and Pelvis. This narrowing did not permit passage of a 12.5 millimeter barium tablet during the exam - which otherwise passed freely through the esophagus. However, transit of barium was only mildly delayed. 3. Prompt gastric emptying.  Incidental duodenal diverticulum. Electronically Signed   By: Odessa Fleming M.D.   On: 01/20/2018 15:25   US Abdomen Limited  Result Date: 01/19/2018 CLINICAL DATA:  Abdominal pain and vomiting EXAM: ULTRASOUND ABDOMEN LIMITED RIGHT UPPER QUADRANT COMPARISON:  01/13/2018 FINDINGS: Gallbladder: Cholelithiasis is again identified. Mild gallbladder wall thickening to 3.5 mm is noted. Mild tenderness is noted over the right upper quadrant. Common bile duct: Diameter: 7.2 mm. This is within normal limits for the patient's given age. Liver: No focal lesion identified. Within normal limits in parenchymal echogenicity. Portal vein is patent on color Doppler imaging with normal direction of blood flow towards the liver. Small right pleural effusion is noted. IMPRESSION: Cholelithiasis with gallbladder wall thickening. Mild right upper quadrant tenderness is noted. Electronically Signed   By: Alcide Clever M.D.   On: 01/19/2018 14:02        Scheduled Meds: . aspirin EC  81 mg Oral Daily  . brinzolamide  1 drop Both Eyes TID   And  . brimonidine  1 drop Both Eyes TID  . enoxaparin (LOVENOX) injection  30 mg Subcutaneous Q24H  . irbesartan  37.5 mg Oral QHS  . lactulose  10 g Oral Daily  . latanoprost  1 drop Left Eye QHS  . pantoprazole (PROTONIX) IV  40 mg Intravenous Q12H  . polyethylene glycol  17 g Oral Daily  . sucralfate  1 g Oral BID   Continuous Infusions: . sodium chloride 75 mL/hr at 01/20/18 1028  . cefTRIAXone (ROCEPHIN)  IV Stopped (01/20/18 0349)     LOS: 1 day     Time spent: 35 mins    Ramiro Harvest, MD [facility] Pager [phone]  If 7PM-7AM, please contact night-coverage [email or web address] Password [email or web address] 01/20/2018, 5:59 PM

## 2018-01-20 NOTE — Progress Notes (Signed)
[facility] consulted, appreciate input on dysphagia, abdominal pain, ? Symptomatic cholelithiasis, Thanks

## 2018-01-20 NOTE — Consult Note (Signed)
[place] Reason for consult: Dysphagia, nausea and vomiting, gallstones Referring Physician: [facility]  Donna Summers is an 84 y.o. female.  HPI: She recently moved here from an assisted living facility to be near her family.She is only been here about a month but has had several ER visits in 1 admission for nausea vomiting and right upper quadrant pain.  She was admitted for this just a couple weeks ago and was seen in consultation by [facility] after she was found to have gallstones.  Her symptoms were coming and going and it was not felt that gallstones were causing the problem.  According to her son who gives most of the history she has had a history in the past of chronic UTIs that have required treatment.  She also has been chronically constipated and had issues with bowel movements.  She has had ER visits for hypertension constipation as well as vomiting.  Her son notes that she has trouble swallowing is able to swallow only a small amount of liquids and often regurgitate solids and liquids while eating.  We were asked to see her for an opinion about whether her symptoms are due to her gallbladder and what is causing problems with her dysphagia and vomiting.  The patient has had a history of reflux and is taking Tums in the past but son is not sure exactly how she was treated for this while in assisted living for the past several years in the [place] part [place] the state.  Past Medical History:  Diagnosis Date  . Constipation   . Constipation   . GERD (gastroesophageal reflux disease)   . Glaucoma   . History of carotid artery disease   . Hyperlipidemia   . Hypertension   . UTI (urinary tract infection)   . Vertigo     Past Surgical History:  Procedure Laterality Date  . UNKNOWN SURGICAL HISTORY      History reviewed. No pertinent family history.  Social History:  reports that she has never smoked. She has never used smokeless tobacco. She reports that she  drank alcohol. She reports that she does not use drugs.  Allergies:  Allergies  Allergen Reactions  . Bactrim [Sulfamethoxazole-Trimethoprim] Other (See Comments)    unknown  . Levaquin [Levofloxacin] Other (See Comments)    unknown    Medications; Prior to Admission medications   Medication Sig Start Date End Date Taking? Authorizing Provider  acetaminophen (TYLENOL) 325 MG tablet Take 650 mg by mouth every 6 (six) hours as needed for mild pain or moderate pain.   Yes [provider]  [facility] OP Apply 1 drop to eye 3 (three) times daily. LEFT EYE   Yes [provider]  cholecalciferol (VITAMIN D) 1000 units tablet Take 1,000 Units by mouth daily.   Yes [provider]  docusate sodium (COLACE) 100 MG capsule Take 100 mg by mouth 2 (two) times daily as needed for mild constipation.   Yes [provider]  irbesartan (AVAPRO) 75 MG tablet Take 37.5 mg by mouth at bedtime. TAKES 1/2 TABLET   Yes [provider]  lactulose (CHRONULAC) 10 GM/15ML solution Take 10 g by mouth daily.   Yes [provider]  meclizine (ANTIVERT) 12.5 MG tablet Take 12.5 mg by mouth 3 (three) times daily as needed for dizziness.   Yes [provider]  polyethylene glycol (MIRALAX / GLYCOLAX) packet Take 17 g by mouth daily. 01/08/18  Yes Doreatha Lew, MD  travoprost, benzalkonium, (  TRAVATAN) 0.004 % ophthalmic solution Place 1 drop into the left eye at bedtime.   Yes [provider]  aspirin EC 81 MG tablet Take 81 mg by mouth daily.    [provider]  rosuvastatin (CRESTOR) 10 MG tablet Take 10 mg by mouth daily.    [provider]  sucralfate (CARAFATE) 1 g tablet Take 1 g by mouth 2 (two) times daily.    [provider]   . aspirin EC  81 mg Oral Daily  . brinzolamide  1 drop Both Eyes TID   And  . brimonidine  1 drop Both Eyes TID  . enoxaparin (LOVENOX) injection  30 mg Subcutaneous Q24H   . irbesartan  37.5 mg Oral QHS  . lactulose  10 g Oral Daily  . latanoprost  1 drop Left Eye QHS  . pantoprazole (PROTONIX) IV  40 mg Intravenous Q12H  . polyethylene glycol  17 g Oral Daily  . sucralfate  1 g Oral BID   PRN Meds acetaminophen **OR** acetaminophen, hydrALAZINE, ondansetron (ZOFRAN) IV Results for orders placed or performed during the hospital encounter of 01/19/18 (from the past 48 hour(s))  CBC     Status: Abnormal   Collection Time: 01/19/18 12:34 PM  Result Value Ref Range   WBC 11.3 (H) 4.0 - 10.5 K/uL   RBC 5.08 3.87 - 5.11 MIL/uL   Hemoglobin 14.4 12.0 - 15.0 g/dL   HCT 44.8 36.0 - 46.0 %   MCV 88.2 78.0 - 100.0 fL   MCH 28.3 26.0 - 34.0 pg   MCHC 32.1 30.0 - 36.0 g/dL   RDW 17.3 (H) 11.5 - 15.5 %   Platelets 250 150 - 400 K/uL    Comment: Performed at [facility], [address] [place], [place] [address]  Comprehensive metabolic panel     Status: Abnormal   Collection Time: 01/19/18 12:34 PM  Result Value Ref Range   Sodium 138 135 - 145 mmol/L   Potassium 3.9 3.5 - 5.1 mmol/L   Chloride 107 101 - 111 mmol/L   CO2 20 (L) 22 - 32 mmol/L   Glucose, Bld 102 (H) 65 - 99 mg/dL   BUN 26 (H) 6 - 20 mg/dL   Creatinine, Ser 1.22 (H) 0.44 - 1.00 mg/dL   Calcium 9.3 8.9 - 10.3 mg/dL   Total Protein 7.0 6.5 - 8.1 g/dL   Albumin 3.4 (L) 3.5 - 5.0 g/dL   AST 22 15 - 41 U/L   ALT 11 (L) 14 - 54 U/L   Alkaline Phosphatase 69 38 - 126 U/L   Total Bilirubin 0.5 0.3 - 1.2 mg/dL   GFR calc non Af Amer 36 (L) >60 mL/min   GFR calc Af Amer 42 (L) >60 mL/min    Comment: (NOTE) The eGFR has been calculated using the CKD EPI equation. This calculation has not been validated in all clinical situations. eGFR's persistently <60 mL/min signify possible Chronic Kidney Disease.    Anion gap 11 5 - 15    Comment: Performed at [facility], [address] [place], [place] [address]  Lipase, blood     Status: None   Collection Time: 01/19/18 12:34  PM  Result Value Ref Range   Lipase 24 11 - 51 U/L    Comment: Performed at [facility], [address] [place], [place] [address]  Troponin I     Status: None   Collection Time: 01/19/18  12:37 PM  Result Value Ref Range   Troponin I <0.03 <0.03 ng/mL    Comment: Performed at [facility], [address] [place], [place] [address]  Urinalysis, Routine w reflex microscopic     Status: Abnormal   Collection Time: 01/19/18  6:46 PM  Result Value Ref Range   Color, Urine AMBER (A) YELLOW    Comment: BIOCHEMICALS MAY BE AFFECTED BY COLOR   APPearance CLOUDY (A) CLEAR   Specific Gravity, Urine 1.019 1.005 - 1.030   pH 5.0 5.0 - 8.0   Glucose, UA NEGATIVE NEGATIVE mg/dL   Hgb urine dipstick LARGE (A) NEGATIVE   Bilirubin Urine NEGATIVE NEGATIVE   Ketones, ur 20 (A) NEGATIVE mg/dL   Protein, ur NEGATIVE NEGATIVE mg/dL   Nitrite NEGATIVE NEGATIVE   Leukocytes, UA LARGE (A) NEGATIVE   RBC / HPF 6-30 0 - 5 RBC/hpf   WBC, UA TOO NUMEROUS TO COUNT 0 - 5 WBC/hpf   Bacteria, UA MANY (A) NONE SEEN   Squamous Epithelial / LPF 6-30 (A) NONE SEEN   Mucus PRESENT    Hyaline Casts, UA PRESENT     Comment: Performed at [facility], [place] [address] [place], [place] [address]  Comprehensive metabolic panel     Status: Abnormal   Collection Time: 01/20/18  4:45 AM  Result Value Ref Range   Sodium 140 135 - 145 mmol/L   Potassium 3.9 3.5 - 5.1 mmol/L   Chloride 112 (H) 101 - 111 mmol/L   CO2 23 22 - 32 mmol/L   Glucose, Bld 85 65 - 99 mg/dL   BUN 27 (H) 6 - 20 mg/dL   Creatinine, Ser 1.06 (H) 0.44 - 1.00 mg/dL   Calcium 8.4 (L) 8.9 - 10.3 mg/dL   Total Protein 5.3 (L) 6.5 - 8.1 g/dL   Albumin 2.5 (L) 3.5 - 5.0 g/dL   AST 15 15 - 41 U/L   ALT 10 (L) 14 - 54 U/L   Alkaline Phosphatase 52 38 - 126 U/L   Total Bilirubin 0.7 0.3 - 1.2 mg/dL   GFR calc non Af Amer 43 (L) >60 mL/min   GFR calc Af Amer 50 (L) >60 mL/min    Comment: (NOTE) The eGFR has  been calculated using the CKD EPI equation. This calculation has not been validated in all clinical situations. eGFR's persistently <60 mL/min signify possible Chronic Kidney Disease.    Anion gap 5 5 - 15    Comment: Performed at [facility], [place] [address], [place], [place] [address]  CBC     Status: Abnormal   Collection Time: 01/20/18  4:45 AM  Result Value Ref Range   WBC 8.7 4.0 - 10.5 K/uL   RBC 4.00 3.87 - 5.11 MIL/uL   Hemoglobin 11.4 (L) 12.0 - 15.0 g/dL   HCT 36.3 36.0 - 46.0 %   MCV 90.8 78.0 - 100.0 fL   MCH 28.5 26.0 - 34.0 pg   MCHC 31.4 30.0 - 36.0 g/dL   RDW 16.6 (H) 11.5 - 15.5 %   Platelets 226 150 - 400 K/uL    Comment: Performed at [facility], [place] [address], [place], [place] [address]  Glucose, capillary     Status: None   Collection Time: 01/20/18  7:39 AM  Result Value Ref Range   Glucose-Capillary 84 65 - 99 mg/dL    US Abdomen Limited  Result Date: 01/19/2018 CLINICAL DATA:  Abdominal pain and  vomiting EXAM: ULTRASOUND ABDOMEN LIMITED RIGHT UPPER QUADRANT COMPARISON:  01/13/2018 FINDINGS: Gallbladder: Cholelithiasis is again identified. Mild gallbladder wall thickening to 3.5 mm is noted. Mild tenderness is noted over the right upper quadrant. Common bile duct: Diameter: 7.2 mm. This is within normal limits for the patient's given age. Liver: No focal lesion identified. Within normal limits in parenchymal echogenicity. Portal vein is patent on color Doppler imaging with normal direction of blood flow towards the liver. Small right pleural effusion is noted. IMPRESSION: Cholelithiasis with gallbladder wall thickening. Mild right upper quadrant tenderness is noted. Electronically Signed   By: Inez Catalina M.D.   On: 01/19/2018 14:02               Blood pressure (!) 148/55, pulse 68, temperature 98.3 F (36.8 C), temperature source Oral, resp. rate 20, height 5' 3"  (1.6 m), weight 70.1 kg (154 lb 8.7 oz), SpO2  96 %.  Physical exam:   General--pleasantly confused white female in no acute distress ENT--nonicteric Neck--no lymphadenopathy Heart--regular rate and rhythm without murmurs or gallops Lungs--clear Abdomen--soft and nontender.  Specifically no right upper quadrant tenderness to exam good bowel sounds Psych--very pleasant and gives a history that everything is fine no abdominal pain or trouble swallowing is confused.  Most of the history obtained from the son.   Assessment: 1.  Dysphagia.  This is been going on for some time ever since patient moved here from her prior assisted living home.  Could be a motility disorder mechanical obstruction etc. she has had a history of heart murmur. 2.  Gallstones.  Exam was not very impressive for acute cholecystitis and LFTs are normal.  I agree with surgery from there earlier evaluation that the need to remove her gallbladder is not clear at this time. 3.  UTI.  Patient on Rocephin culture pending apparently has had a problem with chronic UTIs in the past. 4.  GERD 5.  Constipation   Plan: 1.  We will keep her n.p.o. for now and treat her UTI.  We will obtain a barium swallow with pill to look for signs of mechanical obstruction and evaluate for motility disorder.  Have discussed with son.   Nancy Fetter 01/20/2018, 11:39 AM   This note was created using voice recognition software and minor errors may Have occurred unintentionally. Pager: [phone] If no answer or after hours call [phone]   .jl

## 2018-01-21 DIAGNOSIS — R1084 Generalized abdominal pain: Secondary | ICD-10-CM

## 2018-01-21 LAB — CBC WITH DIFFERENTIAL/PLATELET
BASOS ABS: 0 10*3/uL (ref 0.0–0.1)
Basophils Relative: 0 %
EOS ABS: 0.2 10*3/uL (ref 0.0–0.7)
Eosinophils Relative: 3 %
HCT: 36.4 % (ref 36.0–46.0)
Hemoglobin: 11.3 g/dL — ABNORMAL LOW (ref 12.0–15.0)
Lymphocytes Relative: 32 %
Lymphs Abs: 2.2 10*3/uL (ref 0.7–4.0)
MCH: 28.5 pg (ref 26.0–34.0)
MCHC: 31 g/dL (ref 30.0–36.0)
MCV: 91.9 fL (ref 78.0–100.0)
Monocytes Absolute: 0.8 10*3/uL (ref 0.1–1.0)
Monocytes Relative: 12 %
Neutro Abs: 3.5 10*3/uL (ref 1.7–7.7)
Neutrophils Relative %: 53 %
PLATELETS: 231 10*3/uL (ref 150–400)
RBC: 3.96 MIL/uL (ref 3.87–5.11)
RDW: 16.5 % — ABNORMAL HIGH (ref 11.5–15.5)
WBC: 6.7 10*3/uL (ref 4.0–10.5)

## 2018-01-21 LAB — MAGNESIUM: MAGNESIUM: 1.8 mg/dL (ref 1.7–2.4)

## 2018-01-21 LAB — COMPREHENSIVE METABOLIC PANEL
ALBUMIN: 2.6 g/dL — AB (ref 3.5–5.0)
ALT: 8 U/L — ABNORMAL LOW (ref 14–54)
AST: 14 U/L — AB (ref 15–41)
Alkaline Phosphatase: 55 U/L (ref 38–126)
Anion gap: 7 (ref 5–15)
BUN: 20 mg/dL (ref 6–20)
CHLORIDE: 110 mmol/L (ref 101–111)
CO2: 22 mmol/L (ref 22–32)
Calcium: 8.5 mg/dL — ABNORMAL LOW (ref 8.9–10.3)
Creatinine, Ser: 1.04 mg/dL — ABNORMAL HIGH (ref 0.44–1.00)
GFR calc Af Amer: 51 mL/min — ABNORMAL LOW (ref >60)
GFR calc non Af Amer: 44 mL/min — ABNORMAL LOW (ref >60)
Glucose, Bld: 74 mg/dL (ref 65–99)
POTASSIUM: 3.9 mmol/L (ref 3.5–5.1)
Sodium: 139 mmol/L (ref 135–145)
Total Bilirubin: 0.5 mg/dL (ref 0.3–1.2)
Total Protein: 5.3 g/dL — ABNORMAL LOW (ref 6.5–8.1)

## 2018-01-21 MED ORDER — PANTOPRAZOLE SODIUM 40 MG IV SOLR: 40.0000 mg | INTRAVENOUS | Status: DC

## 2018-01-21 MED ORDER — ENOXAPARIN SODIUM 30 MG/0.3ML ~~LOC~~ SOLN
30.0000 mg | SUBCUTANEOUS | Status: DC
  Administered 2018-01-23 – 2018-01-24 (×2): 30 mg via SUBCUTANEOUS
  Filled 2018-01-21 (×2): qty 0.3

## 2018-01-21 NOTE — [place] FL2 (Signed)
[place] MEDICAID FL2 LEVEL OF CARE SCREENING TOOL     IDENTIFICATION  Patient Name: Donna Summers Birthdate: March 21, 1920 Sex: female Admission Date (Current Location): 01/19/2018  [facility] [place]   Facility and Address:  [facility],  [address], [place] [address]      Provider Number: [id number]  Attending Physician Name and Address:  Burnadette PopAdhikari, Amrit, MD  Relative Name and Phone Number:       Current Level of Care: Hospital Recommended Level of Care: Skilled Nursing Facility Prior Approval Number:    Date Approved/Denied:   PASRR Number: [id number]  Discharge Plan: SNF    Current Diagnoses: Patient Active Problem List   Diagnosis Date Noted  . Dysphagia 01/20/2018  . Acute lower UTI 01/20/2018  . Dehydration 01/20/2018  . Weakness generalized 01/20/2018  . Symptomatic cholelithiasis 01/19/2018  . Cholelithiasis 01/19/2018  . Cholecystitis 01/06/2018  . Constipation 01/06/2018  . History of urinary retention 01/06/2018  . Abdominal pain 01/06/2018  . Vitamin D deficiency 01/06/2018  . HLD (hyperlipidemia) 01/06/2018  . GERD (gastroesophageal reflux disease) 01/06/2018  . HTN (hypertension) 01/06/2018    Orientation RESPIRATION BLADDER Height & Weight     Self, Time, Situation, Place  Normal Incontinent, External catheter Weight: 161 lb 9.6 oz (73.3 kg) Height:  5\' 3"  (160 cm)  BEHAVIORAL SYMPTOMS/MOOD NEUROLOGICAL BOWEL NUTRITION STATUS      Incontinent Diet(NPO in preparation for procedure- see DC summary for updated diet)  AMBULATORY STATUS COMMUNICATION OF NEEDS Skin   Limited Assist Verbally Normal                       Personal Care Assistance Level of Assistance  Bathing, Feeding, Dressing Bathing Assistance: Limited assistance Feeding assistance: Independent Dressing Assistance: Limited assistance     Functional Limitations Info  Sight, Hearing, Speech Sight Info: Adequate Hearing Info:  Adequate Speech Info: Adequate    SPECIAL CARE FACTORS FREQUENCY  PT (By licensed PT), OT (By licensed OT)     PT Frequency: 5x OT Frequency: 5x            Contractures Contractures Info: Not present    Additional Factors Info  Code Status, Allergies Code Status Info: DNR Allergies Info: Bactrim, levoquin           Current Medications (01/21/2018):  This is the current hospital active medication list Current Facility-Administered Medications  Medication Dose Route Frequency Provider Last Rate Last Dose  . 0.9 %  sodium chloride infusion   Intravenous Continuous Rodolph Bonghompson, Daniel V, MD 75 mL/hr at 01/20/18 2154 1,000 mL at 01/20/18 2154  . acetaminophen (TYLENOL) tablet 650 mg  650 mg Oral Q6H PRN Pearson GrippeKim, James, MD       Or  . acetaminophen (TYLENOL) suppository 650 mg  650 mg Rectal Q6H PRN Pearson GrippeKim, James, MD      . brinzolamide (AZOPT) 1 % ophthalmic suspension 1 drop  1 drop Both Eyes TID Pearson GrippeKim, James, MD   1 drop at 01/21/18 1153   And  . brimonidine (ALPHAGAN) 0.2 % ophthalmic solution 1 drop  1 drop Both Eyes TID Pearson GrippeKim, James, MD   1 drop at 01/21/18 1154  . cefTRIAXone (ROCEPHIN) 1 g in sodium chloride 0.9 % 100 mL IVPB  1 g Intravenous Q24H Pearson GrippeKim, James, MD   Stopped at 01/21/18 [phone]  . [START ON 01/23/2018] enoxaparin (LOVENOX) injection 30 mg  30 mg Subcutaneous Q24H Carman ChingEdwards, James, MD      .  hydrALAZINE (APRESOLINE) injection 10 mg  10 mg Intravenous Q6H PRN Pearson Grippe, MD   10 mg at 01/21/18 0503  . irbesartan (AVAPRO) tablet 37.5 mg  37.5 mg Oral Farrel Demark, MD   37.5 mg at 01/20/18 2145  . latanoprost (XALATAN) 0.005 % ophthalmic solution 1 drop  1 drop Left Eye Farrel Demark, MD   1 drop at 01/20/18 2147  . ondansetron (ZOFRAN) injection 4 mg  4 mg Intravenous Q6H PRN Pearson Grippe, MD   4 mg at 01/19/18 2215  . [START ON 01/22/2018] pantoprazole (PROTONIX) injection 40 mg  40 mg Intravenous Q24H Adhikari, Amrit, MD      . polyethylene glycol (MIRALAX / GLYCOLAX) packet 17 g   17 g Oral Daily Pearson Grippe, MD   17 g at 01/21/18 1151     Discharge Medications: Please see discharge summary for a list of discharge medications.  Relevant Imaging Results:  Relevant Lab Results:   Additional Information SS# [id number]  Nelwyn Salisbury, LCSW

## 2018-01-21 NOTE — Progress Notes (Signed)
PROGRESS NOTE    Donna Summers  WJX:[id number] DOB: 03/04/1920 DOA: 01/19/2018 PCP: [name], MD   Brief Narrative: Patient is a 84 year old female with past medical history of hypertension, hyperlipidemia, GERD who presented to med [facility] with complaints of dysphagia as well as abdominal discomfort and nausea.  Patient has been evaluated by gastroenterology for dysphagia and found to have.  Plan is to do  EGD tomorrow.  Assessment & Plan:   Principal Problem:   Dysphagia Active Problems:   Abdominal pain   HLD (hyperlipidemia)   GERD (gastroesophageal reflux disease)   HTN (hypertension)   Cholelithiasis   Acute lower UTI   Dehydration   Weakness generalized   Dysphagia: Patient was complaining of dysphagia to both solids and liquids and she feels as if something is stuck in her lower throat when she tries to swallow.  It has caused decreased oral intake .Patient regurgitates after eating. Patient seen by gastroenterology.  Barium esophagram showed presbyesophagus and possible narrowing at the GE junction. Plan for EGD and balloon dilatation tomorrow.  Abdominal pain:Patient was  recently admitted here for abdominal pain .At that time she found to have cholelithiasis for which surgery was consulted and no surgery was planned. Ultrasound done on this admission showed cholelithiasis with gallbladder wall thickening.  This finding is same as the previous one. We agree with previous surgical recommendation. Patient is also not agreeable for any kind of surgery. She did not complain of any abdominal pain this morning. Her liver enzymes are normal.We will continue conservative management.  Urinary tract infection: UA was positive UTI.  Started on Rocephin.  Will follow up final urine culture sensitivity report.  Culture shows Klebsiella pneumoniae. patient denies any urinary symptoms.  She has history of UTI in the past.  Dehydration: Continue gentle IV fluids  GERD:  Continue PPI.  Patient does not want to take Carafate.    Hypertension: Currently blood pressure stable.Continue her home medications  Generalized weakness: Most likely secondary to poor oral intake in the setting of dysphagia.  Physical therapy worked with the patient and recommended skilled nursing facility on discharge.  Social worker consulted.    DVT prophylaxis: SCD Code Status: DNR Family Communication: Daughter and granddaughter present at the bedside Disposition Plan: Home after EGD and balloon dilation   Consultants: Gastroenterology  Procedures: None  Antimicrobials: Ceftriaxone since 01/20/18  Subjective:  Patient seen and examined at bedside this morning.  She was sitting on the bed and was having her clear liquid diet.  Denies any abdominal pain.  No overnight fever, vomiting or nausea.Looks comfortable.  Objective: Vitals:   01/21/18 0448 01/21/18 0503 01/21/18 0523 01/21/18 1328  BP:  (!) 185/64 136/64 (!) 126/43  Pulse:  61  65  Resp: 16   19  Temp: 98 F (36.7 C)   97.6 F (36.4 C)  TempSrc: Oral   Oral  SpO2: 94%   95%  Weight: 73.3 kg (161 lb 9.6 oz)     Height:        Intake/Output Summary (Last 24 hours) at 01/21/2018 1447 Last data filed at 01/21/2018 1328 Gross per 24 hour  Intake 1520 ml  Output 1050 ml  Net 470 ml   Filed Weights   01/20/18 0500 01/20/18 0632 01/21/18 0448  Weight: 70.1 kg (154 lb 8.7 oz) 70.1 kg (154 lb 8.7 oz) 73.3 kg (161 lb 9.6 oz)    Examination:  General exam: Appears calm and comfortable ,Not in  distress,average built, pleasant elderly female HEENT:PERRL,Oral mucosa moist, Ear/Nose normal on gross exam Respiratory system: Bilateral equal air entry, normal vesicular breath sounds, no wheezes or crackles  Cardiovascular system: S1 & S2 heard, RRR. No JVD, murmurs, rubs, gallops or clicks. No pedal edema. Gastrointestinal system: Abdomen is nondistended, soft and nontender. No organomegaly or masses felt. Normal bowel  sounds heard. Central nervous system: Alert and oriented. No focal neurological deficits. Extremities: No edema, no clubbing ,no cyanosis, distal peripheral pulses palpable. Skin: No rashes, lesions or ulcers,no icterus ,no pallor MSK: Normal muscle bulk,tone ,power Psychiatry: Judgement and insight appear normal. Mood & affect appropriate.     Data Reviewed: I have personally reviewed following labs and imaging studies  CBC: Recent Labs  Lab 01/19/18 1234 01/20/18 0445 01/21/18 0436  WBC 11.3* 8.7 6.7  NEUTROABS  --   --  3.5  HGB 14.4 11.4* 11.3*  HCT 44.8 36.3 36.4  MCV 88.2 90.8 91.9  PLT 250 226 231   Basic Metabolic Panel: Recent Labs  Lab 01/19/18 1234 01/20/18 0445 01/21/18 0436  NA 138 140 139  K 3.9 3.9 3.9  CL 107 112* 110  CO2 20* 23 22  GLUCOSE 102* 85 74  BUN 26* 27* 20  CREATININE 1.22* 1.06* 1.04*  CALCIUM 9.3 8.4* 8.5*  MG  --   --  1.8   GFR: Estimated Creatinine Clearance: 31.1 mL/min (A) (by C-G formula based on SCr of 1.04 mg/dL (H)). Liver Function Tests: Recent Labs  Lab 01/19/18 1234 01/20/18 0445 01/21/18 0436  AST 22 15 14*  ALT 11* 10* 8*  ALKPHOS 69 52 55  BILITOT 0.5 0.7 0.5  PROT 7.0 5.3* 5.3*  ALBUMIN 3.4* 2.5* 2.6*   Recent Labs  Lab 01/19/18 1234  LIPASE 24   No results for input(s): AMMONIA in the last 168 hours. Coagulation Profile: No results for input(s): INR, PROTIME in the last 168 hours. Cardiac Enzymes: Recent Labs  Lab 01/19/18 1237  TROPONINI <0.03   BNP (last 3 results) No results for input(s): PROBNP in the last 8760 hours. HbA1C: No results for input(s): HGBA1C in the last 72 hours. CBG: Recent Labs  Lab 01/20/18 0739  GLUCAP 84   Lipid Profile: No results for input(s): CHOL, HDL, LDLCALC, TRIG, CHOLHDL, LDLDIRECT in the last 72 hours. Thyroid Function Tests: No results for input(s): TSH, T4TOTAL, FREET4, T3FREE, THYROIDAB in the last 72 hours. Anemia Panel: No results for input(s):  VITAMINB12, FOLATE, FERRITIN, TIBC, IRON, RETICCTPCT in the last 72 hours. Sepsis Labs: No results for input(s): PROCALCITON, LATICACIDVEN in the last 168 hours.  Recent Results (from the past 240 hour(s))  Culture, Urine     Status: Abnormal (Preliminary result)   Collection Time: 01/19/18  6:46 PM  Result Value Ref Range Status   Specimen Description   Final    URINE, RANDOM Performed at [facility], [address] [place], [place] [address]    Special Requests   Final    NONE Performed at [facility], [address] [place], [place] [address]    Culture >=100,000 COLONIES/mL KLEBSIELLA PNEUMONIAE (A)  Final   Report Status PENDING  Incomplete         Radiology Studies: Dg Esophagus  Result Date: 01/20/2018 CLINICAL DATA:  84 year old female with dysphagia nausea and vomiting. EXAM: ESOPHOGRAM/BARIUM SWALLOW TECHNIQUE: Single contrast examination was performed using thin barium and a 12.5 millimeter barium tablet. FLUOROSCOPY TIME:  Fluoroscopy Time:  2 minutes  6 seconds Radiation Exposure Index (if provided by the fluoroscopic device): 27.9 mGy Number of Acquired Spot Images: 0 COMPARISON:  CT Abdomen and Pelvis 01/13/2018 FINDINGS: A single contrast study was undertaken with head of the fluoroscopy table inclined at 30 degrees, and the patient tolerated this well and without difficulty. No obstruction to the forward flow of contrast throughout the esophagus and into the stomach. Decreased esophageal motility, but no tertiary contractions occurred. Normal esophageal course and contour. Prompt transit of contrast into the intraabdominal stomach (series 1), however, a small hiatal hernia became apparent over the course of the study. A persistently narrow it appearance of the proximal gastric lumen at the level of the esophageal hiatus was noted throughout the exam (series 3, image 57 and also series 14, image 50), but appears to be benign and non  malignant. Similar benign compression of gastric cardia noted on the recent CT Abdomen and Pelvis. This narrowing did not allow passage of A 12.5 mm barium tablet, which otherwise passed freely to the distal esophagus and into the small hiatal hernia without delay. A small volume of contrast was maintained in the distal esophagus and hiatal hernia. Prompt gastric emptying was noted. At the conclusion of the study contrast had reached the proximal jejunum. Incidental 2 centimeter diverticulum of the distal duodenum (series 19). IMPRESSION: 1. 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 2. Small gastric hiatal hernia which results in extrinsic mass effect and narrowing on the lumen of the stomach through the esophageal hiatus. This appearance also visible on the recent CT Abdomen and Pelvis. This narrowing did not permit passage of a 12.5 millimeter barium tablet during the exam - which otherwise passed freely through the esophagus. However, transit of barium was only mildly delayed. 3. Prompt gastric emptying.  Incidental duodenal diverticulum. Electronically Signed   By: Odessa Fleming M.D.   On: 01/20/2018 15:25        Scheduled Meds: . brinzolamide  1 drop Both Eyes TID   And  . brimonidine  1 drop Both Eyes TID  . [START ON 01/23/2018] enoxaparin (LOVENOX) injection  30 mg Subcutaneous Q24H  . irbesartan  37.5 mg Oral QHS  . latanoprost  1 drop Left Eye QHS  . [START ON 01/22/2018] pantoprazole (PROTONIX) IV  40 mg Intravenous Q24H  . polyethylene glycol  17 g Oral Daily   Continuous Infusions: . sodium chloride 1,000 mL (01/20/18 2154)  . cefTRIAXone (ROCEPHIN)  IV Stopped (01/21/18 0325)     LOS: 2 days    Time spent: 25 mins.More than 50% of that time was spent in counseling and/or coordination of care.   Please Note: This patient record was dictated using Animal nutritionist. Chart creation errors have been sought, but may not always have been located. Such creation errors do not reflect on the Standard  of Medical Care.    Burnadette Pop, MD [facility] Pager [phone]  If 7PM-7AM, please contact night-coverage [email or web address] Password [facility] 01/21/2018, 2:47 PM

## 2018-01-21 NOTE — Progress Notes (Signed)
OT Cancellation Note  Patient Details Name: Donna Summers MRN: [id number] DOB: 05-27-1920   Cancelled Treatment:    Reason Eval/Treat Not Completed: Other (comment)  Pt just got back to bed and is sleeping. Will check back later.   [name] 01/21/2018, 1:15 PM     Marica OtterMaryellen Jens Siems, OTR/L [phone] 01/21/2018

## 2018-01-21 NOTE — Clinical Social Work Note (Addendum)
Clinical Social Work Assessment  Patient Details  Name: Donna Summers MRN: [id number] Date of Birth: [birth year]  Date of referral:  01/21/18               Reason for consult:  Facility Placement                Permission sought to share information with:  Facility Sport and exercise psychologist, Family Supports Permission granted to share information::  Yes, Verbal Permission Granted  Name::      Stage manager::  SNF  Relationship:: Son/Daughter in Financial trader Information:   [phone]  Housing/Transportation Living arrangements for the past 2 months:  [place] of Information:  Adult Children Patient Interpreter Needed:  None Criminal Activity/Legal Involvement Pertinent to Current Situation/Hospitalization:  No - Comment as needed Significant Relationships:  Adult Children Lives with:  Facility Resident Do you feel safe going back to the place where you live?  Yes Need for family participation in patient care:  Yes (Patient has dementia)  Care giving concerns:   Patient admitted for complaints of dysphagia as well as abdominal discomfort and nausea.  Social Worker assessment / plan: CSW met with the patient, daughter and son in law by phone. Patient daughter reports the patient lives in an [place] out of town. The patient family brought her [facility] due to limited resources where she lives. The patient in law reports at the facility the patient need limited assist.The patient bathes and dresses herself daily with support. She uses a walker ambulate and lift chair as well.  The patient is deconditioned and weak. Physical therapy has recommended SNF placement. Patient family prefers a facility in the [place] area, [place], [place] if they have space available. CSW explain the SNF process and later providing bed offers. CSW explain Medicare 3 night inpatient stay requirement. Both report understanding.  FL2 complete.   Plan:  SNF  Employment status:  Retired Forensic scientist:  Medicare PT Recommendations:  [place] / Referral to community resources:  [facility]  Patient/Family's Response to care: Engineer, drilling of CSW services.   Patient/Family's Understanding of and Emotional Response to Diagnosis, Current Treatment, and Prognosis:  Patient has dementia. Patient son and daughter and law are very involved in patient care. They hopeful to learn more about the patient diagnosis after the scheduled EGD.   Emotional Assessment Appearance:  Developmentally appropriate Attitude/Demeanor/Rapport:    Affect (typically observed):  Accepting Orientation:  Oriented to Self Alcohol / Substance use:  Not Applicable Psych involvement (Current and /or in the community):  No (Comment)  Discharge Needs  Concerns to be addressed:  Discharge Planning Concerns Readmission within the last 30 days:  No Current discharge risk:  Dependent with Mobility Barriers to Discharge:  Continued Medical Work up   [employer], LCSW 01/21/2018, 6:28 PM

## 2018-01-21 NOTE — Progress Notes (Signed)
[facility] PROGRESS NOTE Subjective Patient had barium swallow which shows marked presbyesophagus and possible narrowing at the GE junction with hang up of the pill.  She has had problems with reflux.  She is tolerating without problems.  Objective: Vital signs in last 24 hours: Temp:  [97.7 F (36.5 C)-98 F (36.7 C)] 98 F (36.7 C) (04/05 0448) Pulse Rate:  [59-115] 61 (04/05 0503) Resp:  [16-18] 16 (04/05 0448) BP: (134-185)/(59-64) 136/64 (04/05 0523) SpO2:  [92 %-94 %] 94 % (04/05 0448) Weight:  [73.3 kg (161 lb 9.6 oz)] 73.3 kg (161 lb 9.6 oz) (04/05 0448) Last BM Date: 01/17/18  Intake/Output from previous day: 04/04 0701 - 04/05 0700 In: 1575 [P.O.:410; I.V.:1165] Out: 1100 [Urine:1100] Intake/Output this shift: Total I/O In: 120 [P.O.:120] Out: 250 [Urine:250]  PE: General--sitting up in bed eating popsicles and drinking clear liquids   Lab Results: Recent Labs    01/19/18 1234 01/20/18 0445 01/21/18 0436  WBC 11.3* 8.7 6.7  HGB 14.4 11.4* 11.3*  HCT 44.8 36.3 36.4  PLT 250 226 231   BMET Recent Labs    01/19/18 1234 01/20/18 0445 01/21/18 0436  NA 138 140 139  K 3.9 3.9 3.9  CL 107 112* 110  CO2 20* 23 22  CREATININE 1.22* 1.06* 1.04*   LFT Recent Labs    01/19/18 1234 01/20/18 0445 01/21/18 0436  PROT 7.0 5.3* 5.3*  AST 22 15 14*  ALT 11* 10* 8*  ALKPHOS 69 52 55  BILITOT 0.5 0.7 0.5   PT/INR No results for input(s): LABPROT, INR in the last 72 hours. PANCREAS Recent Labs    01/19/18 1234  LIPASE 24         Studies/Results: Dg Esophagus  Result Date: 01/20/2018 CLINICAL DATA:  84 year old female with dysphagia nausea and vomiting. EXAM: ESOPHOGRAM/BARIUM SWALLOW TECHNIQUE: Single contrast examination was performed using thin barium and a 12.5 millimeter barium tablet. FLUOROSCOPY TIME:  Fluoroscopy Time:  2 minutes 6 seconds Radiation Exposure Index (if provided by the fluoroscopic device): 27.9 mGy Number of  Acquired Spot Images: 0 COMPARISON:  CT Abdomen and Pelvis 01/13/2018 FINDINGS: A single contrast study was undertaken with head of the fluoroscopy table inclined at 30 degrees, and the patient tolerated this well and without difficulty. No obstruction to the forward flow of contrast throughout the esophagus and into the stomach. Decreased esophageal motility, but no tertiary contractions occurred. Normal esophageal course and contour. Prompt transit of contrast into the intraabdominal stomach (series 1), however, a small hiatal hernia became apparent over the course of the study. A persistently narrow it appearance of the proximal gastric lumen at the level of the esophageal hiatus was noted throughout the exam (series 3, image 57 and also series 14, image 50), but appears to be benign and non malignant. Similar benign compression of gastric cardia noted on the recent CT Abdomen and Pelvis. This narrowing did not allow passage of A 12.5 mm barium tablet, which otherwise passed freely to the distal esophagus and into the small hiatal hernia without delay. A small volume of contrast was maintained in the distal esophagus and hiatal hernia. Prompt gastric emptying was noted. At the conclusion of the study contrast had reached the proximal jejunum. Incidental 2 centimeter diverticulum of the distal duodenum (series 19). IMPRESSION: 1. 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 2. Small gastric hiatal hernia which results in extrinsic mass effect and narrowing on the lumen of the stomach through the esophageal hiatus. This appearance also visible on  the recent CT Abdomen and Pelvis. This narrowing did not permit passage of a 12.5 millimeter barium tablet during the exam - which otherwise passed freely through the esophagus. However, transit of barium was only mildly delayed. 3. Prompt gastric emptying.  Incidental duodenal diverticulum. Electronically Signed   By: Odessa FlemingH  Hall M.D.   On: 01/20/2018 15:25   [place] Abdomen  Limited  Result Date: 01/19/2018 CLINICAL DATA:  Abdominal pain and vomiting EXAM: ULTRASOUND ABDOMEN LIMITED RIGHT UPPER QUADRANT COMPARISON:  01/13/2018 FINDINGS: Gallbladder: Cholelithiasis is again identified. Mild gallbladder wall thickening to 3.5 mm is noted. Mild tenderness is noted over the right upper quadrant. Common bile duct: Diameter: 7.2 mm. This is within normal limits for the patient's given age. Liver: No focal lesion identified. Within normal limits in parenchymal echogenicity. Portal vein is patent on color Doppler imaging with normal direction of blood flow towards the liver. Small right pleural effusion is noted. IMPRESSION: Cholelithiasis with gallbladder wall thickening. Mild right upper quadrant tenderness is noted. Electronically Signed   By: Alcide CleverMark  Lukens M.D.   On: 01/19/2018 14:02    Medications: I have reviewed the patient's current medications.  Assessment:   1.  Dysphagia.  Probably due to combination of presbyesophagus with poor motility and some narrowing at the GE junction.  The patient's son, daughter-in-law and granddaughter were all in the room.  We discussed what to do with this and discuss the possibility of some improvement with dilatation.  We discussed the fact that a good part of the problem is probably result of presbyesophagus and may not be improved with dilatation but there was not really anything else that would likely help.  We discussed the fact that dilatation could result in perforation of the esophagus but that that was fairly rare.  There is always a possibility there is a mass or something else that could be affecting things although this is not really obvious from the barium swallow.   Plan: Ovarian the people everyone understands that there is some slight risk to dilatation.  They also understand this is about the only thing that really may help her.  We have scheduled her tomorrow at 9:00 for EGD and balloon dilatation with propofol with Dr Evette CristalGanem.   We will hold her Lovenox today.   Tresea MallJames L Brilee Port 01/21/2018, 10:37 AM  This note was created using voice recognition software. Minor errors may Have occurred unintentionally.  Pager: [phone] If no answer or after hours call [phone]

## 2018-01-21 NOTE — Evaluation (Signed)
Physical Therapy Evaluation Patient Details Name: Donna Summers MRN: [id number] DOB: 03/01/1920 Today's Date: 01/21/2018   History of Present Illness  Donna Summers is a 84 year old female with medical history significant for hypertension, hyperlipidemia and GERD; recent admission 01/06/2018 for abdominal pain.  Pt was evaluated by surgery at that time and thought surgery not indicated at that time.  Pt has apparently c/o some dysphagia, abdominal discomfort and nausea; Patient had barium swallow which shows marked presbyesophagus and possible narrowing at the GE junction with hang up of the pill, p ti s scheduled  for EGD and balloon dilatation  on 01/22/18  Clinical Impression  Pt admitted with above diagnosis. Pt currently with functional limitations due to the deficits listed below (see PT Problem List). Pt known to me from previous admission, she is weaker this admission, with limited endurance; recommend SNF post acute; will continue to follow for needs;  Pt will benefit from skilled PT to increase their independence and safety with mobility to allow discharge to the venue listed below.       Follow Up Recommendations SNF    Equipment Recommendations  None recommended by PT    Recommendations for Other Services       Precautions / Restrictions Precautions Precautions: Fall      Mobility  Bed Mobility Overal bed mobility: Needs Assistance Bed Mobility: Supine to Sit     Supine to sit: Min guard     General bed mobility comments: for safety, incr time needed, HOB elevated 60*, min/guard to elevate trunk  Transfers Overall transfer level: Needs assistance Equipment used: Rolling walker (2 wheeled) Transfers: Sit to/from Stand Sit to Stand: Min guard;Min assist         General transfer comment:  incr time needed, assistance to steady self upon rising, braces LEs against bed, cues for hand placement and overall safety  Ambulation/Gait Ambulation/Gait assistance: Min  assist Ambulation Distance (Feet): 70 Feet Assistive device: Rolling walker (2 wheeled) Gait Pattern/deviations: Step-through pattern;Decreased stride length;Trunk flexed;Narrow base of support     General Gait Details: repetitious multi-modal  cues for trunk extension. RW distance from self; assist for balance during turns/direction changes  Stairs            Wheelchair Mobility    Modified Rankin (Stroke Patients Only)       Balance Overall balance assessment: Needs assistance   Sitting balance-Leahy Scale: Good     Standing balance support: During functional activity;Bilateral upper extremity supported   Standing balance comment: reliant on UEs static an dynamic                             Pertinent Vitals/Pain Pain Assessment: No/denies pain    Home Living Family/patient expects to be discharged to:: Unsure   Available Help at Discharge: Family Type of Home: House Home Access: Stairs to enter   Entergy CorporationEntrance Stairs-Number of Steps: 5 Home Layout: Two level;Able to live on main level with bedroom/bathroom Home Equipment: Walker - 2 wheels Additional Comments: plan is for pt to stay with her son here in [place] for a few days and then return to her ALF or go to rehab and then back to SNF    Prior Function           Comments: lives in ALF, IND ADLS, amb with RW at baseline--pt admittedly has been less active recently (family confirms)     Hand Dominance  Extremity/Trunk Assessment   Upper Extremity Assessment Upper Extremity Assessment: Generalized weakness    Lower Extremity Assessment Lower Extremity Assessment: Generalized weakness       Communication   Communication: No difficulties  Cognition Arousal/Alertness: Awake/alert Behavior During Therapy: WFL for tasks assessed/performed Overall Cognitive Status: Within Functional Limits for tasks assessed Area of Impairment: Memory                                General Comments: questionable mild STM deficits vs difficulty hearing; pt repeats same information/story  during PT eval      General Comments      Exercises     Assessment/Plan    PT Assessment Patient needs continued PT services  PT Problem List Decreased strength;Decreased activity tolerance;Decreased mobility;Decreased safety awareness;Decreased balance       PT Treatment Interventions DME instruction;Gait training;Functional mobility training;Therapeutic activities;Therapeutic exercise;Patient/family education    PT Goals (Current goals can be found in the Care Plan section)  Acute Rehab PT Goals Patient Stated Goal: back to her home PT Goal Formulation: With patient/family Time For Goal Achievement: 02/04/18 Potential to Achieve Goals: Good    Frequency Min 3X/week   Barriers to discharge        Co-evaluation               AM-PAC PT "6 Clicks" Daily Activity  Outcome Measure Difficulty turning over in bed (including adjusting bedclothes, sheets and blankets)?: Unable Difficulty moving from lying on back to sitting on the side of the bed? : Unable Difficulty sitting down on and standing up from a chair with arms (e.g., wheelchair, bedside commode, etc,.)?: Unable Help needed moving to and from a bed to chair (including a wheelchair)?: A Little Help needed walking in hospital room?: A Little Help needed climbing 3-5 steps with a railing? : A Lot 6 Click Score: 11    End of Session Equipment Utilized During Treatment: Gait belt Activity Tolerance: Patient tolerated treatment well Patient left: with call bell/phone within reach;with family/visitor present;in chair   PT Visit Diagnosis: Unsteadiness on feet (R26.81)    Time: 1610-9604 PT Time Calculation (min) (ACUTE ONLY): 32 min   Charges:   PT Evaluation $PT Eval Low Complexity: 1 Low PT Treatments $Gait Training: 8-22 mins   PT G CodesDrucilla Chalet, PT Pager:  [phone] 01/21/2018   [facility] 01/21/2018, 11:48 AM

## 2018-01-22 ENCOUNTER — Inpatient Hospital Stay (HOSPITAL_COMMUNITY): Payer: Medicare Other | Admitting: Certified Registered Nurse Anesthetist

## 2018-01-22 ENCOUNTER — Encounter (HOSPITAL_COMMUNITY)
Admission: EM | Disposition: A | Discharge status: Skilled Nursing Facility | Payer: Self-pay | Source: Home / Self Care | Attending: Internal Medicine

## 2018-01-22 ENCOUNTER — Encounter (HOSPITAL_COMMUNITY): Payer: Self-pay | Admitting: Certified Registered Nurse Anesthetist

## 2018-01-22 HISTORY — PX: ESOPHAGOGASTRODUODENOSCOPY (EGD) WITH PROPOFOL: SHX5813

## 2018-01-22 LAB — URINE CULTURE

## 2018-01-22 SURGERY — ESOPHAGOGASTRODUODENOSCOPY (EGD) WITH PROPOFOL
Anesthesia: Monitor Anesthesia Care

## 2018-01-22 SURGERY — EGD (ESOPHAGOGASTRODUODENOSCOPY)
Anesthesia: Monitor Anesthesia Care

## 2018-01-22 MED ORDER — SODIUM CHLORIDE 0.9 % IV SOLN: INTRAVENOUS | Status: DC

## 2018-01-22 MED ORDER — PROPOFOL 500 MG/50ML IV EMUL
INTRAVENOUS | Status: DC | PRN
  Administered 2018-01-22: 40 ug/kg/min via INTRAVENOUS

## 2018-01-22 MED ORDER — PROPOFOL 10 MG/ML IV BOLUS
INTRAVENOUS | Status: AC
  Filled 2018-01-22: qty 20

## 2018-01-22 MED ORDER — PROPOFOL 10 MG/ML IV BOLUS
INTRAVENOUS | Status: AC
  Filled 2018-01-22: qty 40

## 2018-01-22 MED ORDER — PANTOPRAZOLE SODIUM 40 MG IV SOLR
40.0000 mg | Freq: Two times a day (BID) | INTRAVENOUS | Status: DC
  Administered 2018-01-22 – 2018-01-23 (×4): 40 mg via INTRAVENOUS
  Filled 2018-01-22 (×4): qty 40

## 2018-01-22 MED ORDER — BRIMONIDINE TARTRATE 0.2 % OP SOLN
1.0000 [drp] | Freq: Three times a day (TID) | OPHTHALMIC | Status: DC
  Administered 2018-01-22 – 2018-01-24 (×6): 1 [drp] via OPHTHALMIC

## 2018-01-22 MED ORDER — BRINZOLAMIDE 1 % OP SUSP
1.0000 [drp] | Freq: Three times a day (TID) | OPHTHALMIC | Status: DC
  Administered 2018-01-22 – 2018-01-24 (×6): 1 [drp] via OPHTHALMIC

## 2018-01-22 SURGICAL SUPPLY — 15 items

## 2018-01-22 NOTE — Op Note (Signed)
[facility] Patient Name: Donna Summers Procedure Date: 01/22/2018 MRN: [id number] Attending MD: Graylin Shiver , MD Date of Birth: 09/22/1920 CSN: [id number] Age: 84 Admit Type: Inpatient Procedure:                Upper GI endoscopy Indications:              Dysphagia, Abnormal UGI series Providers:                Graylin Shiver, MD, Tomma Rakers, RN, Judithann Sauger, Technician, Lauree Chandler Armistead, CRNA Referring MD:              Medicines:                Propofol per Anesthesia Complications:            No immediate complications. Estimated Blood Loss:     Estimated blood loss was minimal. Procedure:                Pre-Anesthesia Assessment:                           - Prior to the procedure, a History and Physical                            was performed, and patient medications and                            allergies were reviewed. The patient's tolerance of                            previous anesthesia was also reviewed. The risks                            and benefits of the procedure and the sedation                            options and risks were discussed with the patient.                            All questions were answered, and informed consent                            was obtained. Prior Anticoagulants: The patient has                            taken no previous anticoagulant or antiplatelet                            agents. ASA Grade Assessment: II - A patient with                            mild systemic disease. After reviewing the risks  and benefits, the patient was deemed in                            satisfactory condition to undergo the procedure.                           After obtaining informed consent, the endoscope was                            passed under direct vision. Throughout the                            procedure, the patient's blood pressure, pulse, and                oxygen saturations were monitored continuously. The                            was introduced through the mouth, and advanced to                            the second part of duodenum. The upper GI endoscopy                            was accomplished without difficulty. The patient                            tolerated the procedure well. Scope In: Scope Out: Findings:      Esophagitis was found. This was an ulcerative esophagitis in the distal       and mid esophagus. Biopsies were taken with a cold forceps for       histology. There is no stricture. The EG junction was at 32 cm. There       was a slightly raised redish area at the EG junction not bleeding. Could       be a clot.      A medium-sized hiatal hernia was present.      The in the duodenum was normal. Impression:               - Reflux esophagitis. Biopsied.                           - Medium-sized hiatal hernia.                           - Normal. Moderate Sedation:      . Recommendation:           - Full liquid diet.                           - Continue present medications. Procedure Code(s):        --- Professional ---                           [id number], Esophagogastroduodenoscopy, flexible,  transoral; with biopsy, single or multiple Diagnosis Code(s):        --- Professional ---                           K21.0, Gastro-esophageal reflux disease with                            esophagitis                           K44.9, Diaphragmatic hernia without obstruction or                            gangrene                           R13.10, Dysphagia, unspecified                           R93.3, Abnormal findings on diagnostic imaging of                            other parts of digestive tract CPT copyright 2017 American Medical Association. All rights reserved. The codes documented in this report are preliminary and upon coder review may  be revised to meet current compliance  requirements. Graylin ShiverSalem F Lina Hitch, MD 01/22/2018 9:26:34 AM This report has been signed electronically. Number of Addenda: 0

## 2018-01-22 NOTE — Progress Notes (Signed)
Patient at EGD this morning. Will attempt OT evaluation at a later date.  Tersa Fotopoulos, OTR/L

## 2018-01-22 NOTE — Transfer of Care (Signed)
Immediate Anesthesia Transfer of Care Note  Patient: Donna Summers  Procedure(s) Performed: ESOPHAGOGASTRODUODENOSCOPY (EGD) WITH PROPOFOL (N/A )  Patient Location: PACU and Endoscopy Unit  Anesthesia Type:MAC  Level of Consciousness: awake, alert , oriented and patient cooperative  Airway & Oxygen Therapy: Patient Spontanous Breathing and Patient connected to face mask oxygen  Post-op Assessment: Report given to RN, Post -op Vital signs reviewed and stable and Patient moving all extremities  Post vital signs: Reviewed and stable  Last Vitals:  Vitals Value Taken Time  BP    Temp    Pulse    Resp    SpO2      Last Pain:  Vitals:   01/22/18 0839  TempSrc: Oral  PainSc: 0-No pain         Complications: No apparent anesthesia complications

## 2018-01-22 NOTE — H&P (Signed)
The patient presents to the endoscopy unit today for EGD with possible dilatation because of dysphagia and a possible distal esophageal stricture seen on recent barium swallow  Physical  No distress  Heart regular rhythm  Lungs clear  Abdomen soft nontender  Impression: Dysphagia  Plan; EGD with possible dilatation  .

## 2018-01-22 NOTE — Anesthesia Postprocedure Evaluation (Signed)
Anesthesia Post Note  Patient: Donna Summers  Procedure(s) Performed: ESOPHAGOGASTRODUODENOSCOPY (EGD) WITH PROPOFOL (N/A )     Patient location during evaluation: Endoscopy Anesthesia Type: MAC Level of consciousness: awake and alert Pain management: pain level controlled Vital Signs Assessment: post-procedure vital signs reviewed and stable Respiratory status: spontaneous breathing, nonlabored ventilation, respiratory function stable and patient connected to nasal cannula oxygen Cardiovascular status: stable and blood pressure returned to baseline Postop Assessment: no apparent nausea or vomiting Anesthetic complications: no    Last Vitals:  Vitals:   01/22/18 0930 01/22/18 0947  BP: (!) 222/50 (!) 174/49  Pulse:  60  Resp: 20 20  Temp:  36.7 C  SpO2: 95% 93%    Last Pain:  Vitals:   01/22/18 0947  TempSrc: Oral  PainSc: 0-No pain                 Catalina Gravel

## 2018-01-22 NOTE — Anesthesia Preprocedure Evaluation (Addendum)
Anesthesia Evaluation  Patient identified by MRN, date of birth, ID band Patient awake    Reviewed: Allergy & Precautions, NPO status , Patient's Chart, lab work & pertinent test results  Airway Mallampati: II  TM Distance: >3 FB Neck ROM: Full    Dental  (+) Teeth Intact, Dental Advisory Given   Pulmonary neg pulmonary ROS,    Pulmonary exam normal breath sounds clear to auscultation       Cardiovascular hypertension, Pt. on medications + Peripheral Vascular Disease  Normal cardiovascular exam Rhythm:Regular Rate:Normal     Neuro/Psych negative neurological ROS     GI/Hepatic Neg liver ROS, GERD  ,Dysphagia    Endo/Other  negative endocrine ROS  Renal/GU negative Renal ROS     Musculoskeletal negative musculoskeletal ROS (+)   Abdominal   Peds  Hematology  (+) Blood dyscrasia, anemia ,   Anesthesia Other Findings Day of surgery medications reviewed with the patient.  Reproductive/Obstetrics                             Anesthesia Physical Anesthesia Plan  ASA: III  Anesthesia Plan: MAC   Post-op Pain Management:    Induction: Intravenous  PONV Risk Score and Plan: 2 and Propofol infusion and Treatment may vary due to age or medical condition  Airway Management Planned: Nasal Cannula  Additional Equipment:   Intra-op Plan:   Post-operative Plan:   Informed Consent: I have reviewed the patients History and Physical, chart, labs and discussed the procedure including the risks, benefits and alternatives for the proposed anesthesia with the patient or authorized representative who has indicated his/her understanding and acceptance.   Dental advisory given  Plan Discussed with: CRNA and Anesthesiologist  Anesthesia Plan Comments: (Discussed risks/benefits/alternatives to MAC sedation including need for ventilatory support, hypotension, need for conversion to general  anesthesia.  All patient questions answered.  Patient/guardian wishes to proceed.)       Anesthesia Quick Evaluation

## 2018-01-22 NOTE — Progress Notes (Signed)
PROGRESS NOTE    Donna Summers  WUJ:[id number] DOB: 1920-07-20 DOA: 01/19/2018 PCP: Wanda PlumpPaz, Jose E, MD    Brief Narrative:   84 year old with past medical history relevant for hypertension, hyperlipidemia, recurrent UTIs admitted with dysphasia with EGD showing evidence of reflux esophagitis and no evidence of stricture.   Assessment & Plan:   Principal Problem:   Dysphagia Active Problems:   Abdominal pain   HLD (hyperlipidemia)   GERD (gastroesophageal reflux disease)   HTN (hypertension)   Cholelithiasis   Acute lower UTI   Dehydration   Weakness generalized   #) Dysphagia: Likely secondary to pain from reflux esophagitis noted on EGD on 01/22/2018 -Continue IV pantoprazole 40 mg every 12 hours -Full liquid diet -Restart home sucralfate 1 g twice daily -Gentle hydration with 75 movements an hour of normal saline -GI following appreciate recommendations  #) Hypertension: -Continue irbesartan 37.5mg  QD -Continue PRN hydralazine 10 mg every 6 hours as needed IV  Fluids: Gentle IV fluids Electrolyte: Monitor and supplement Nutrition: Full liquid diet, advance diet as tolerated  Prophylaxis: Enoxaparin  Disposition: Likely to significant rehab  DO NOT RESUSCITATE   Consultants:   Gastroenterology  Procedures: (Don't include imaging studies which can be auto populated. Include things that cannot be auto populated i.e. Echo, Carotid and venous dopplers, Foley, Bipap, HD, tubes/drains, wound vac, central lines etc) 01/22/2018 NFA:[id number]:Esophagitis was found. This was an ulcerative esophagitis in the distal and mid esophagus. Biopsies were taken with a cold forceps for histology. There is no stricture. The EG junction was at 32 cm. There was a  slightly raised redish area at the EG junction not bleeding. Could be a clot.  Antimicrobials: (specify start and planned stop date. Auto populated tables are space occupying and do not give end  dates)  None   Subjective: Patient is just returned from EGD.  She is quite sleepy but does not report any pain.  She is quite eager to try eating.  Objective: Vitals:   01/22/18 1051 01/22/18 1200 01/22/18 1321 01/22/18 1435  BP: (!) 166/40 (!) 171/67 (!) 154/47 (!) 195/62  Pulse: (!) 50 (!) 55 66 65  Resp: 18 20 18 20   Temp: 98.4 F (36.9 C) 97.9 F (36.6 C) 98.1 F (36.7 C) 98.5 F (36.9 C)  TempSrc: Oral Oral Oral Oral  SpO2: 93% 94% 96% 94%  Weight:      Height:        Intake/Output Summary (Last 24 hours) at 01/22/2018 1453 Last data filed at 01/22/2018 1124 Gross per 24 hour  Intake 1975 ml  Output -  Net 1975 ml   Filed Weights   01/21/18 0448 01/22/18 0222 01/22/18 0839  Weight: 73.3 kg (161 lb 9.6 oz) 73.3 kg (161 lb 9.6 oz) 73 kg (161 lb)    Examination:  General exam: Appears calm and comfortable  Respiratory system: Clear to auscultation. Respiratory effort normal. Cardiovascular system: Regular rate and rhythm, no murmurs. Gastrointestinal system: Soft, nondistended, nontender, plus bowel sounds Central nervous system: Alert but sleeping in bed Extremities: No lower extremity edema Skin: No rashes on visible skin Psychiatry: Unable to assess as patient is sleeping currently    Data Reviewed: I have personally reviewed following labs and imaging studies  CBC: Recent Labs  Lab 01/19/18 1234 01/20/18 0445 01/21/18 0436  WBC 11.3* 8.7 6.7  NEUTROABS  --   --  3.5  HGB 14.4 11.4* 11.3*  HCT 44.8 36.3 36.4  MCV 88.2 90.8 91.9  PLT  250 226 231   Basic Metabolic Panel: Recent Labs  Lab 01/19/18 1234 01/20/18 0445 01/21/18 0436  NA 138 140 139  K 3.9 3.9 3.9  CL 107 112* 110  CO2 20* 23 22  GLUCOSE 102* 85 74  BUN 26* 27* 20  CREATININE 1.22* 1.06* 1.04*  CALCIUM 9.3 8.4* 8.5*  MG  --   --  1.8   GFR: Estimated Creatinine Clearance: 31 mL/min (A) (by C-G formula based on SCr of 1.04 mg/dL (H)). Liver Function Tests: Recent Labs   Lab 01/19/18 1234 01/20/18 0445 01/21/18 0436  AST 22 15 14*  ALT 11* 10* 8*  ALKPHOS 69 52 55  BILITOT 0.5 0.7 0.5  PROT 7.0 5.3* 5.3*  ALBUMIN 3.4* 2.5* 2.6*   Recent Labs  Lab 01/19/18 1234  LIPASE 24   No results for input(s): AMMONIA in the last 168 hours. Coagulation Profile: No results for input(s): INR, PROTIME in the last 168 hours. Cardiac Enzymes: Recent Labs  Lab 01/19/18 1237  TROPONINI <0.03   BNP (last 3 results) No results for input(s): PROBNP in the last 8760 hours. HbA1C: No results for input(s): HGBA1C in the last 72 hours. CBG: Recent Labs  Lab 01/20/18 0739  GLUCAP 84   Lipid Profile: No results for input(s): CHOL, HDL, LDLCALC, TRIG, CHOLHDL, LDLDIRECT in the last 72 hours. Thyroid Function Tests: No results for input(s): TSH, T4TOTAL, FREET4, T3FREE, THYROIDAB in the last 72 hours. Anemia Panel: No results for input(s): VITAMINB12, FOLATE, FERRITIN, TIBC, IRON, RETICCTPCT in the last 72 hours. Sepsis Labs: No results for input(s): PROCALCITON, LATICACIDVEN in the last 168 hours.  Recent Results (from the past 240 hour(s))  Culture, Urine     Status: Abnormal   Collection Time: 01/19/18  6:46 PM  Result Value Ref Range Status   Specimen Description   Final    URINE, RANDOM Performed at [facility], [address] [place], [place] [address]    Special Requests   Final    NONE Performed at [facility], [address] [place], [place] [address]    Culture >=100,000 COLONIES/mL KLEBSIELLA PNEUMONIAE (A)  Final   Report Status 01/22/2018 FINAL  Final   Organism ID, Bacteria KLEBSIELLA PNEUMONIAE (A)  Final      Susceptibility   Klebsiella pneumoniae - MIC*    AMPICILLIN >=32 RESISTANT Resistant     CEFAZOLIN <=4 SENSITIVE Sensitive     CEFTRIAXONE <=1 SENSITIVE Sensitive     CIPROFLOXACIN <=0.25 SENSITIVE Sensitive     GENTAMICIN <=1 SENSITIVE Sensitive     IMIPENEM 0.5 SENSITIVE Sensitive      NITROFURANTOIN 64 INTERMEDIATE Intermediate     TRIMETH/SULFA <=20 SENSITIVE Sensitive     AMPICILLIN/SULBACTAM 4 SENSITIVE Sensitive     PIP/TAZO <=4 SENSITIVE Sensitive     Extended ESBL NEGATIVE Sensitive     * >=100,000 COLONIES/mL KLEBSIELLA PNEUMONIAE         Radiology Studies: No results found.      Scheduled Meds: . brimonidine  1 drop Left Eye TID   And  . brinzolamide  1 drop Left Eye TID  . [START ON 01/23/2018] enoxaparin (LOVENOX) injection  30 mg Subcutaneous Q24H  . irbesartan  37.5 mg Oral QHS  . latanoprost  1 drop Left Eye QHS  . pantoprazole (PROTONIX) IV  40 mg Intravenous Q24H  . polyethylene glycol  17 g Oral Daily   Continuous Infusions: . cefTRIAXone (ROCEPHIN)  IV Stopped (01/22/18  0231)     LOS: 3 days    Time spent: 30    Delaine Lame, MD [facility]   If 7PM-7AM, please contact night-coverage [email or web address] Password [email or web address] 01/22/2018, 2:53 PM

## 2018-01-23 ENCOUNTER — Encounter (HOSPITAL_COMMUNITY): Payer: Self-pay | Admitting: Gastroenterology

## 2018-01-23 NOTE — Progress Notes (Signed)
CSW provided list of current offers to pt and pt son Germain OsgoodBrit at bedside- no choice at this time  Burna SisJenna H. Insiya Oshea, LCSW Clinical Social Worker [phone]

## 2018-01-23 NOTE — Progress Notes (Signed)
[facility] Progress Note  Subjective: Patient says she is doing fine. Denies heartburn. He has been able to eat without dysphagia today. Esophagitis found yesterday on endoscopy. No stricture found.  Objective: Vital signs in last 24 hours: Temp:  [97.9 F (36.6 C)-98.5 F (36.9 C)] 98.3 F (36.8 C) (04/07 0522) Pulse Rate:  [50-66] 62 (04/07 0522) Resp:  [18-20] 20 (04/07 0522) BP: (152-195)/(40-70) 158/70 (04/07 0522) SpO2:  [92 %-96 %] 92 % (04/07 0522) Weight:  [72 kg (158 lb 11.7 oz)] 72 kg (158 lb 11.7 oz) (04/07 0500) Weight change: -0.271 kg (-9.6 oz)   PE:no distress  Abdomen soft nontender  Lab Results: No results found for this or any previous visit (from the past 24 hour(s)).  Studies/Results: No results found.    Assessment: Ulcerative erosive esophagitis  Plan:   Continue PPI therapy. Advanced diet as tolerated. We will sign off. Call us if needed.    Gwenevere AbbotSAM F Talah Cookston 01/23/2018, 10:46 AM  Pager: [phone] If no answer or after 5 PM call [phone]

## 2018-01-23 NOTE — Progress Notes (Signed)
PROGRESS NOTE    Donna Summers  ZOX:[id number] DOB: 1920-04-05 DOA: 01/19/2018 PCP: [name], MD    Brief Narrative:   84 year old with past medical history relevant for hypertension, hyperlipidemia, recurrent UTIs admitted with dysphasia with EGD showing evidence of reflux esophagitis and no evidence of stricture.   Assessment & Plan:   Principal Problem:   Dysphagia Active Problems:   Abdominal pain   HLD (hyperlipidemia)   GERD (gastroesophageal reflux disease)   HTN (hypertension)   Cholelithiasis   Acute lower UTI   Dehydration   Weakness generalized   #) Dysphagia: Likely secondary to pain from reflux esophagitis noted on EGD on 01/22/2018 -Continue IV pantoprazole 40 mg every 12 hours -Advance diet to soft today -Continue home sucralfate 1 g twice daily -Discontinue hydration  -GI following appreciate recommendations, they have signed off  #) Hypertension: -Continue irbesartan 37.5mg  QD -Continue PRN hydralazine 10 mg every 6 hours as needed IV  Fluids: Tolerating liquids Electrolyte: Monitor and supplement Nutrition: Full liquid diet, advance diet as tolerated  Prophylaxis: Enoxaparin  Disposition: Likely to skilled nursing facility with rehab  DO NOT RESUSCITATE   Consultants:   Gastroenterology  Procedures: (Don't include imaging studies which can be auto populated. Include things that cannot be auto populated i.e. Echo, Carotid and venous dopplers, Foley, Bipap, HD, tubes/drains, wound vac, central lines etc) 01/22/2018 WJX:[id number] was found. This was an ulcerative esophagitis in the distal and mid esophagus. Biopsies were taken with a cold forceps for histology. There is no stricture. The EG junction was at 32 cm. There was a  slightly raised redish area at the EG junction not bleeding. Could be a clot.  Antimicrobials: (specify start and planned stop date. Auto populated tables are space occupying and do not give end  dates)  None   Subjective: Patient is doing well today.  She tolerated her full liquid diet without any pain or dysphasia.  She is willing to try a soft diet today.  Objective: Vitals:   01/22/18 1453 01/22/18 2137 01/23/18 0500 01/23/18 0522  BP: (!) 153/54 (!) 152/57  (!) 158/70  Pulse: (!) 58 60  62  Resp:  20  20  Temp:  98 F (36.7 C)  98.3 F (36.8 C)  TempSrc:  Oral  Oral  SpO2:  94%  92%  Weight:   72 kg (158 lb 11.7 oz)   Height:        Intake/Output Summary (Last 24 hours) at 01/23/2018 1223 Last data filed at 01/23/2018 0900 Gross per 24 hour  Intake 360 ml  Output -  Net 360 ml   Filed Weights   01/22/18 0222 01/22/18 0839 01/23/18 0500  Weight: 73.3 kg (161 lb 9.6 oz) 73 kg (161 lb) 72 kg (158 lb 11.7 oz)    Examination:  General exam: Appears calm and comfortable  Respiratory system: Clear to auscultation. Respiratory effort normal. Cardiovascular system: Regular rate and rhythm, no murmurs. Gastrointestinal system: Soft, nondistended, nontender, plus bowel sounds Central nervous system: Alert and moving all extremities Extremities: No lower extremity edema Skin: No rashes on visible skin Psychiatry: Mood and judgment appears intact   Data Reviewed: I have personally reviewed following labs and imaging studies  CBC: Recent Labs  Lab 01/19/18 1234 01/20/18 0445 01/21/18 0436  WBC 11.3* 8.7 6.7  NEUTROABS  --   --  3.5  HGB 14.4 11.4* 11.3*  HCT 44.8 36.3 36.4  MCV 88.2 90.8 91.9  PLT 250 226 231  Basic Metabolic Panel: Recent Labs  Lab 01/19/18 1234 01/20/18 0445 01/21/18 0436  NA 138 140 139  K 3.9 3.9 3.9  CL 107 112* 110  CO2 20* 23 22  GLUCOSE 102* 85 74  BUN 26* 27* 20  CREATININE 1.22* 1.06* 1.04*  CALCIUM 9.3 8.4* 8.5*  MG  --   --  1.8   GFR: Estimated Creatinine Clearance: 30.8 mL/min (A) (by C-G formula based on SCr of 1.04 mg/dL (H)). Liver Function Tests: Recent Labs  Lab 01/19/18 1234 01/20/18 0445  01/21/18 0436  AST 22 15 14*  ALT 11* 10* 8*  ALKPHOS 69 52 55  BILITOT 0.5 0.7 0.5  PROT 7.0 5.3* 5.3*  ALBUMIN 3.4* 2.5* 2.6*   Recent Labs  Lab 01/19/18 1234  LIPASE 24   No results for input(s): AMMONIA in the last 168 hours. Coagulation Profile: No results for input(s): INR, PROTIME in the last 168 hours. Cardiac Enzymes: Recent Labs  Lab 01/19/18 1237  TROPONINI <0.03   BNP (last 3 results) No results for input(s): PROBNP in the last 8760 hours. HbA1C: No results for input(s): HGBA1C in the last 72 hours. CBG: Recent Labs  Lab 01/20/18 0739  GLUCAP 84   Lipid Profile: No results for input(s): CHOL, HDL, LDLCALC, TRIG, CHOLHDL, LDLDIRECT in the last 72 hours. Thyroid Function Tests: No results for input(s): TSH, T4TOTAL, FREET4, T3FREE, THYROIDAB in the last 72 hours. Anemia Panel: No results for input(s): VITAMINB12, FOLATE, FERRITIN, TIBC, IRON, RETICCTPCT in the last 72 hours. Sepsis Labs: No results for input(s): PROCALCITON, LATICACIDVEN in the last 168 hours.  Recent Results (from the past 240 hour(s))  Culture, Urine     Status: Abnormal   Collection Time: 01/19/18  6:46 PM  Result Value Ref Range Status   Specimen Description   Final    URINE, RANDOM Performed at [facility], [address] [place], [place] [address]    Special Requests   Final    NONE Performed at [facility] [place], [address] [place], [place] [address]    Culture >=100,000 COLONIES/mL KLEBSIELLA PNEUMONIAE (A)  Final   Report Status 01/22/2018 FINAL  Final   Organism ID, Bacteria KLEBSIELLA PNEUMONIAE (A)  Final      Susceptibility   Klebsiella pneumoniae - MIC*    AMPICILLIN >=32 RESISTANT Resistant     CEFAZOLIN <=4 SENSITIVE Sensitive     CEFTRIAXONE <=1 SENSITIVE Sensitive     CIPROFLOXACIN <=0.25 SENSITIVE Sensitive     GENTAMICIN <=1 SENSITIVE Sensitive     IMIPENEM 0.5 SENSITIVE Sensitive     NITROFURANTOIN 64 INTERMEDIATE  Intermediate     TRIMETH/SULFA <=20 SENSITIVE Sensitive     AMPICILLIN/SULBACTAM 4 SENSITIVE Sensitive     PIP/TAZO <=4 SENSITIVE Sensitive     Extended ESBL NEGATIVE Sensitive     * >=100,000 COLONIES/mL KLEBSIELLA PNEUMONIAE         Radiology Studies: No results found.      Scheduled Meds: . brimonidine  1 drop Left Eye TID   And  . brinzolamide  1 drop Left Eye TID  . enoxaparin (LOVENOX) injection  30 mg Subcutaneous Q24H  . irbesartan  37.5 mg Oral QHS  . latanoprost  1 drop Left Eye QHS  . pantoprazole (PROTONIX) IV  40 mg Intravenous Q12H  . polyethylene glycol  17 g Oral Daily   Continuous Infusions: . cefTRIAXone (ROCEPHIN)  IV Stopped (01/23/18 0317)     LOS: 4 days  Time spent: 30    Delaine Lame, MD [facility]   If 7PM-7AM, please contact night-coverage [email or web address] Password [facility] 01/23/2018, 12:23 PM

## 2018-01-24 MED ORDER — PANTOPRAZOLE SODIUM 40 MG PO TBEC
40.0000 mg | DELAYED_RELEASE_TABLET | Freq: Two times a day (BID) | ORAL | Status: DC
  Administered 2018-01-24: 40 mg via ORAL
  Filled 2018-01-24: qty 1

## 2018-01-24 MED ORDER — PANTOPRAZOLE SODIUM 40 MG PO TBEC: 40.0000 mg | DELAYED_RELEASE_TABLET | Freq: Two times a day (BID) | ORAL | 2 refills | Status: AC

## 2018-01-24 NOTE — Clinical Social Work Placement (Addendum)
Nurse given numnber to call report Room: 307P Patient to transport by car.   CLINICAL SOCIAL WORK PLACEMENT  NOTE  Date:  01/24/2018  Patient Details  Name: Donna Summers MRN: [id number] Date of Birth: 1920-09-30  Clinical Social Work is seeking post-discharge placement for this patient at the Skilled  Nursing Facility level of care (*CSW will initial, date and re-position this form in  chart as items are completed):  Yes   Patient/family provided with [place] Clinical Social Work Department's list of facilities offering this level of care within the geographic area requested by the patient (or if unable, by the patient's family).  Yes   Patient/family informed of their freedom to choose among providers that offer the needed level of care, that participate in Medicare, Medicaid or managed care program needed by the patient, have an available bed and are willing to accept the patient.  Yes   Patient/family informed of [facility]'s ownership interest in [facility] and [facility], as well as of the fact that they are under no obligation to receive care at these facilities.  PASRR submitted to EDS on       PASRR number received on       Existing PASRR number confirmed on 01/21/18     FL2 transmitted to all facilities in geographic area requested by pt/family on       FL2 transmitted to all facilities within larger geographic area on 01/21/18     Patient informed that his/her managed care company has contracts with or will negotiate with certain facilities, including the following:  [facility]     Yes   Patient/family informed of bed offers received.  [facility]     Physician recommends and patient chooses bed at      [facility].  Patient to be transferred to facility by Transport by CAR     Patient family notified on 01/24/18 of transfer.  Name of family member notified:  Son-[name]      PHYSICIAN Please prepare priority discharge summary, including medications, Please sign DNR     Additional Comment:    _______________________________________________ Clearance CootsNicole A Emrik Erhard, LCSW 01/24/2018, 12:21 PM

## 2018-01-24 NOTE — Discharge Summary (Signed)
Physician Discharge Summary  Donna Summers:[id number] DOB: [birth year] DOA: 01/19/2018  PCP: [name], MD  Admit date: 01/19/2018 Discharge date: 01/24/2018  Admitted From: Home Disposition: Skilled nursing facility  Recommendations for Outpatient Follow-up:  1. Follow up with PCP in 1-2 weeks 2. Please obtain BMP/CBC in one week 3.  Home Health: No Equipment/Devices: None  Discharge Condition: Stable CODE STATUS: DNR Diet recommendation: Regular diet as tolerated  Brief/Interim Summary:  #) Dysphagia: Patient was admitted with severe dysphagia.  Esophagram/barium swallow showed concern for possible stricture.  EGD was performed on 01/22/2018 and only showed severe erosive esophagitis with no evidence of mass or stricture.  Patient was started on IV Protonix 40 mg twice daily and transition to oral Protonix 40 mg twice daily on discharge.  Her diet was initially started on clears and then advanced to a soft bland diet which she tolerated extremely well.  #)  hypertension: Patient was continued on home ARB.  # hyperlipidemia: Patient statin was discontinued and aspirin was discontinued at the request of the family.  Discharge Diagnoses:  Principal Problem:   Dysphagia Active Problems:   Abdominal pain   HLD (hyperlipidemia)   GERD (gastroesophageal reflux disease)   HTN (hypertension)   Cholelithiasis   Acute lower UTI   Dehydration   Weakness generalized    Discharge Instructions  Discharge Instructions    Call MD for:  difficulty breathing, headache or visual disturbances   Complete by:  As directed    Call MD for:  hives   Complete by:  As directed    Call MD for:  persistant nausea and vomiting   Complete by:  As directed    Call MD for:  redness, tenderness, or signs of infection (pain, swelling, redness, odor or green/yellow discharge around incision site)   Complete by:  As directed    Call MD for:  severe uncontrolled pain   Complete by:  As directed     Call MD for:  temperature >100.4   Complete by:  As directed    Diet - low sodium heart healthy   Complete by:  As directed    Discharge instructions   Complete by:  As directed    Please follow-up with your PCP in 1 week.   Increase activity slowly   Complete by:  As directed      Allergies as of 01/24/2018      Reactions   Bactrim [sulfamethoxazole-trimethoprim] Other (See Comments)   unknown   Levaquin [levofloxacin] Other (See Comments)   unknown      Medication List    STOP taking these medications   aspirin EC 81 MG tablet   cholecalciferol 1000 units tablet Commonly known as:  VITAMIN D   docusate sodium 100 MG capsule Commonly known as:  COLACE   lactulose 10 GM/15ML solution Commonly known as:  CHRONULAC   rosuvastatin 10 MG tablet Commonly known as:  CRESTOR   sucralfate 1 g tablet Commonly known as:  CARAFATE     TAKE these medications   acetaminophen 325 MG tablet Commonly known as:  TYLENOL Take 650 mg by mouth every 6 (six) hours as needed for mild pain or moderate pain.   BRINZOLAMIDE-BRIMONIDINE OP Apply 1 drop to eye 3 (three) times daily. LEFT EYE   irbesartan 75 MG tablet Commonly known as:  AVAPRO Take 37.5 mg by mouth at bedtime. TAKES 1/2 TABLET   meclizine 12.5 MG tablet Commonly known as:  ANTIVERT Take 12.5  mg by mouth 3 (three) times daily as needed for dizziness.   pantoprazole 40 MG tablet Commonly known as:  PROTONIX Take 1 tablet (40 mg total) by mouth 2 (two) times daily.   polyethylene glycol packet Commonly known as:  MIRALAX / GLYCOLAX Take 17 g by mouth daily.   travoprost (benzalkonium) 0.004 % ophthalmic solution Commonly known as:  TRAVATAN Place 1 drop into the left eye at bedtime.       Allergies  Allergen Reactions  . Bactrim [Sulfamethoxazole-Trimethoprim] Other (See Comments)    unknown  . Levaquin [Levofloxacin] Other (See Comments)    unknown     Consultations:  Gastroneurology   Procedures/Studies: Dg Chest 2 View  Result Date: 01/13/2018 CLINICAL DATA:  History of constipation.  Weakness. EXAM: CHEST - 2 VIEW COMPARISON:  01/06/2018 FINDINGS: There is bilateral mild chronic interstitial thickening. There is no focal consolidation. There is no pleural effusion or pneumothorax. The heart size is top-normal. There is thoracic aortic atherosclerosis. There are multiple old healed right posterior rib fractures. IMPRESSION: No active cardiopulmonary disease. Electronically Signed   By: Elige KoHetal  Patel   On: 01/13/2018 13:44   [place] Abdomen Complete  Result Date: 01/06/2018 CLINICAL DATA:  Right upper quadrant abdominal pain for the past week. Nausea. EXAM: ABDOMEN ULTRASOUND COMPLETE COMPARISON:  None. FINDINGS: Gallbladder: Multiple gallstones in the gallbladder, the largest measuring 1.1 cm in maximum diameter. Mild diffuse gallbladder wall thickening with a maximum thickness of 3.4 mm. No pericholecystic fluid. No sonographic Murphy sign. Common bile duct: Diameter: 6.9 mm Liver: No focal lesion identified. Within normal limits in parenchymal echogenicity. Portal vein is patent on color Doppler imaging with normal direction of blood flow towards the liver. IVC: No abnormality visualized. Pancreas: Visualized portion unremarkable. Spleen: Size and appearance within normal limits. Right Kidney: Length: 8.7 cm. Diffuse cortical thinning. Prominent renal sinus fat. Normal echotexture. No hydronephrosis. Left Kidney: Length: 8.6 cm. Diffuse cortical thinning. Prominent renal sinus fat. Normal echotexture. No hydronephrosis. Abdominal aorta: No aneurysm visualized. Other findings: None. IMPRESSION: 1. Cholelithiasis. 2. Mild diffuse gallbladder wall thickening. This is most likely due to chronic cholecystitis. 3. No biliary ductal dilatation. 4. Diffuse bilateral renal cortical atrophy. Electronically Signed   By: Beckie SaltsSteven  Reid M.D.   On: 01/06/2018  12:22   Ct Abdomen Pelvis W Contrast  Result Date: 01/13/2018 CLINICAL DATA:  Recently released from the hospital 5 days ago, has not had a bowel movement in 5 days since being released despite MiraLax, history hypertension, GERD EXAM: CT ABDOMEN AND PELVIS WITH CONTRAST TECHNIQUE: Multidetector CT imaging of the abdomen and pelvis was performed using the standard protocol following bolus administration of intravenous contrast. Sagittal and coronal MPR images reconstructed from axial data set. CONTRAST:  80mL ISOVUE-300 IOPAMIDOL (ISOVUE-300) INJECTION 61% IV. No oral contrast. COMPARISON:  None; correlation abdominal ultrasound 01/06/2018 FINDINGS: Lower chest: Bibasilar subpleural interstitial changes and minimal basilar atelectasis. Tiny LEFT pleural effusion. Hepatobiliary: Cholelithiasis. Minimal gallbladder wall thickening. No biliary dilatation. Liver unremarkable. Pancreas: Atrophic.  Partial fatty replacement at pancreatic head. Spleen: Normal appearance Adrenals/Urinary Tract: Adrenal glands normal appearance. Tiny RIGHT renal cyst. Kidneys, ureters, and bladder normal appearance Stomach/Bowel: Mildly increased stool throughout colon. Appendix not visualized but no pericecal inflammatory process seen. Moderate to large hiatal hernia. Stomach and bowel loops otherwise unremarkable. Vascular/Lymphatic: Extensive atherosclerotic calcifications of, iliac arteries and coronary arteries. Aorta normal caliber. No adenopathy. Reproductive: Uterus surgically absent. Nonvisualization of ovaries. Other: No free air or free fluid.  No hernia.  Musculoskeletal: Osseous structures unremarkable. IMPRESSION: Cholelithiasis with minimal gallbladder wall thickening unchanged from prior ultrasound, may reflect chronic cholecystitis. Hiatal hernia. Extensive atherosclerotic disease changes including coronary arteries. Mildly increased stool throughout colon. Minimal LEFT pleural effusion, LEFT basilar atelectasis and  BILATERAL basilar subpleural interstitial changes. Electronically Signed   By: Ulyses Southward M.D.   On: 01/13/2018 15:10   Dg Esophagus  Result Date: 01/20/2018 CLINICAL DATA:  84 year old female with dysphagia nausea and vomiting. EXAM: ESOPHOGRAM/BARIUM SWALLOW TECHNIQUE: Single contrast examination was performed using thin barium and a 12.5 millimeter barium tablet. FLUOROSCOPY TIME:  Fluoroscopy Time:  2 minutes 6 seconds Radiation Exposure Index (if provided by the fluoroscopic device): 27.9 mGy Number of Acquired Spot Images: 0 COMPARISON:  CT Abdomen and Pelvis 01/13/2018 FINDINGS: A single contrast study was undertaken with head of the fluoroscopy table inclined at 30 degrees, and the patient tolerated this well and without difficulty. No obstruction to the forward flow of contrast throughout the esophagus and into the stomach. Decreased esophageal motility, but no tertiary contractions occurred. Normal esophageal course and contour. Prompt transit of contrast into the intraabdominal stomach (series 1), however, a small hiatal hernia became apparent over the course of the study. A persistently narrow it appearance of the proximal gastric lumen at the level of the esophageal hiatus was noted throughout the exam (series 3, image 57 and also series 14, image 50), but appears to be benign and non malignant. Similar benign compression of gastric cardia noted on the recent CT Abdomen and Pelvis. This narrowing did not allow passage of A 12.5 mm barium tablet, which otherwise passed freely to the distal esophagus and into the small hiatal hernia without delay. A small volume of contrast was maintained in the distal esophagus and hiatal hernia. Prompt gastric emptying was noted. At the conclusion of the study contrast had reached the proximal jejunum. Incidental 2 centimeter diverticulum of the distal duodenum (series 19). IMPRESSION: 1. 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 2. Small gastric hiatal hernia which  results in extrinsic mass effect and narrowing on the lumen of the stomach through the esophageal hiatus. This appearance also visible on the recent CT Abdomen and Pelvis. This narrowing did not permit passage of a 12.5 millimeter barium tablet during the exam - which otherwise passed freely through the esophagus. However, transit of barium was only mildly delayed. 3. Prompt gastric emptying.  Incidental duodenal diverticulum. Electronically Signed   By: Odessa Fleming M.D.   On: 01/20/2018 15:25   US Abdomen Limited  Result Date: 01/19/2018 CLINICAL DATA:  Abdominal pain and vomiting EXAM: ULTRASOUND ABDOMEN LIMITED RIGHT UPPER QUADRANT COMPARISON:  01/13/2018 FINDINGS: Gallbladder: Cholelithiasis is again identified. Mild gallbladder wall thickening to 3.5 mm is noted. Mild tenderness is noted over the right upper quadrant. Common bile duct: Diameter: 7.2 mm. This is within normal limits for the patient's given age. Liver: No focal lesion identified. Within normal limits in parenchymal echogenicity. Portal vein is patent on color Doppler imaging with normal direction of blood flow towards the liver. Small right pleural effusion is noted. IMPRESSION: Cholelithiasis with gallbladder wall thickening. Mild right upper quadrant tenderness is noted. Electronically Signed   By: Alcide Clever M.D.   On: 01/19/2018 14:02   Dg Abdomen Acute W/chest  Result Date: 01/06/2018 CLINICAL DATA:  Abdominal pain.  Gallstones. EXAM: DG ABDOMEN ACUTE W/ 1V CHEST COMPARISON:  None. FINDINGS: There is no evidence of dilated bowel loops or free intraperitoneal air. No radiopaque calculi or other significant radiographic  abnormality is seen. Heart size and mediastinal contours are within normal limits. Both lungs are clear. Vascular calcification of the aorta. IMPRESSION: Negative abdominal radiographs.  No acute cardiopulmonary disease. Electronically Signed   By: Elsie Stain M.D.   On: 01/06/2018 13:20    01/22/2018 [id number]  was found. This was an ulcerative esophagitis in the distal and mid esophagus. Biopsies were taken with a cold forceps for histology. There is no stricture. The EG junction was at 32 cm. There was a slightly raised redish area at the EG junction not bleeding. Could be a clot.   Subjective:   Discharge Exam: Vitals:   01/23/18 2135 01/24/18 0555  BP: (!) 158/63 (!) 155/61  Pulse: 66 62  Resp: 20 20  Temp: 98.4 F (36.9 C) 98.3 F (36.8 C)  SpO2: 93% 93%   Vitals:   01/23/18 0522 01/23/18 2135 01/24/18 0555 01/24/18 0807  BP: (!) 158/70 (!) 158/63 (!) 155/61   Pulse: 62 66 62   Resp: 20 20 20    Temp: 98.3 F (36.8 C) 98.4 F (36.9 C) 98.3 F (36.8 C)   TempSrc: Oral Oral Oral   SpO2: 92% 93% 93%   Weight:    71.8 kg (158 lb 4.6 oz)  Height:       General exam: Appears calm and comfortable  Respiratory system: Clear to auscultation. Respiratory effort normal. Cardiovascular system: Regular rate and rhythm, no murmurs. Gastrointestinal system: Soft, nondistended, nontender, plus bowel sounds Central nervous system: Alert and moving all extremities Extremities: No lower extremity edema Skin: No rashes on visible skin Psychiatry: Mood and judgment appears intact    The results of significant diagnostics from this hospitalization (including imaging, microbiology, ancillary and laboratory) are listed below for reference.     Microbiology: Recent Results (from the past 240 hour(s))  Culture, Urine     Status: Abnormal   Collection Time: 01/19/18  6:46 PM  Result Value Ref Range Status   Specimen Description   Final    URINE, RANDOM Performed at [facility], [address], [place], [place] [address]    Special Requests   Final    NONE Performed at [facility], [address] [place], [place] [address]    Culture >=100,000 COLONIES/mL KLEBSIELLA PNEUMONIAE (A)  Final   Report Status 01/22/2018 FINAL  Final   Organism ID, Bacteria  KLEBSIELLA PNEUMONIAE (A)  Final      Susceptibility   Klebsiella pneumoniae - MIC*    AMPICILLIN >=32 RESISTANT Resistant     CEFAZOLIN <=4 SENSITIVE Sensitive     CEFTRIAXONE <=1 SENSITIVE Sensitive     CIPROFLOXACIN <=0.25 SENSITIVE Sensitive     GENTAMICIN <=1 SENSITIVE Sensitive     IMIPENEM 0.5 SENSITIVE Sensitive     NITROFURANTOIN 64 INTERMEDIATE Intermediate     TRIMETH/SULFA <=20 SENSITIVE Sensitive     AMPICILLIN/SULBACTAM 4 SENSITIVE Sensitive     PIP/TAZO <=4 SENSITIVE Sensitive     Extended ESBL NEGATIVE Sensitive     * >=100,000 COLONIES/mL KLEBSIELLA PNEUMONIAE     Labs: BNP (last 3 results) No results for input(s): BNP in the last 8760 hours. Basic Metabolic Panel: Recent Labs  Lab 01/19/18 1234 01/20/18 0445 01/21/18 0436  NA 138 140 139  K 3.9 3.9 3.9  CL 107 112* 110  CO2 20* 23 22  GLUCOSE 102* 85 74  BUN 26* 27* 20  CREATININE 1.22* 1.06* 1.04*  CALCIUM 9.3 8.4* 8.5*  MG  --   --  1.8   Liver Function Tests: Recent Labs  Lab 01/19/18 1234 01/20/18 0445 01/21/18 0436  AST 22 15 14*  ALT 11* 10* 8*  ALKPHOS 69 52 55  BILITOT 0.5 0.7 0.5  PROT 7.0 5.3* 5.3*  ALBUMIN 3.4* 2.5* 2.6*   Recent Labs  Lab 01/19/18 1234  LIPASE 24   No results for input(s): AMMONIA in the last 168 hours. CBC: Recent Labs  Lab 01/19/18 1234 01/20/18 0445 01/21/18 0436  WBC 11.3* 8.7 6.7  NEUTROABS  --   --  3.5  HGB 14.4 11.4* 11.3*  HCT 44.8 36.3 36.4  MCV 88.2 90.8 91.9  PLT 250 226 231   Cardiac Enzymes: Recent Labs  Lab 01/19/18 1237  TROPONINI <0.03   BNP: Invalid input(s): POCBNP CBG: Recent Labs  Lab 01/20/18 0739  GLUCAP 84   D-Dimer No results for input(s): DDIMER in the last 72 hours. Hgb A1c No results for input(s): HGBA1C in the last 72 hours. Lipid Profile No results for input(s): CHOL, HDL, LDLCALC, TRIG, CHOLHDL, LDLDIRECT in the last 72 hours. Thyroid function studies No results for input(s): TSH, T4TOTAL, T3FREE,  THYROIDAB in the last 72 hours.  Invalid input(s): FREET3 Anemia work up No results for input(s): VITAMINB12, FOLATE, FERRITIN, TIBC, IRON, RETICCTPCT in the last 72 hours. Urinalysis    Component Value Date/Time   COLORURINE AMBER (A) 01/19/2018 1846   APPEARANCEUR CLOUDY (A) 01/19/2018 1846   LABSPEC 1.019 01/19/2018 1846   PHURINE 5.0 01/19/2018 1846   GLUCOSEU NEGATIVE 01/19/2018 1846   HGBUR LARGE (A) 01/19/2018 1846   BILIRUBINUR NEGATIVE 01/19/2018 1846   KETONESUR 20 (A) 01/19/2018 1846   PROTEINUR NEGATIVE 01/19/2018 1846   NITRITE NEGATIVE 01/19/2018 1846   LEUKOCYTESUR LARGE (A) 01/19/2018 1846   Sepsis Labs Invalid input(s): PROCALCITONIN,  WBC,  LACTICIDVEN Microbiology Recent Results (from the past 240 hour(s))  Culture, Urine     Status: Abnormal   Collection Time: 01/19/18  6:46 PM  Result Value Ref Range Status   Specimen Description   Final    URINE, RANDOM Performed at [facility], [address] [place], [place] [address]    Special Requests   Final    NONE Performed at [facility], [address] [place], [place] [address]    Culture >=100,000 COLONIES/mL KLEBSIELLA PNEUMONIAE (A)  Final   Report Status 01/22/2018 FINAL  Final   Organism ID, Bacteria KLEBSIELLA PNEUMONIAE (A)  Final      Susceptibility   Klebsiella pneumoniae - MIC*    AMPICILLIN >=32 RESISTANT Resistant     CEFAZOLIN <=4 SENSITIVE Sensitive     CEFTRIAXONE <=1 SENSITIVE Sensitive     CIPROFLOXACIN <=0.25 SENSITIVE Sensitive     GENTAMICIN <=1 SENSITIVE Sensitive     IMIPENEM 0.5 SENSITIVE Sensitive     NITROFURANTOIN 64 INTERMEDIATE Intermediate     TRIMETH/SULFA <=20 SENSITIVE Sensitive     AMPICILLIN/SULBACTAM 4 SENSITIVE Sensitive     PIP/TAZO <=4 SENSITIVE Sensitive     Extended ESBL NEGATIVE Sensitive     * >=100,000 COLONIES/mL KLEBSIELLA PNEUMONIAE     Time coordinating discharge: Over 30 minutes  SIGNED:   Delaine Lame,  MD  [facility] 01/24/2018, 12:23 PM   If 7PM-7AM, please contact night-coverage [email or web address] Password [email or web address]

## 2018-01-24 NOTE — Discharge Instructions (Signed)
Esophagitis Esophagitis is inflammation of the esophagus. The esophagus is the tube that carries food and liquids from your mouth to your stomach. Esophagitis can cause soreness or pain in the esophagus. This condition can make it difficult and painful to swallow. What are the causes? Most causes of esophagitis are not serious. Common causes of this condition include:  Gastroesophageal reflux disease (GERD). This is when stomach contents move back up into the esophagus (reflux).  Repeated vomiting.  An allergic-type reaction, especially caused by food allergies (eosinophilic esophagitis).  Injury to the esophagus by swallowing large pills with or without water, or swallowing certain types of medicines.  Swallowing (ingesting) harmful chemicals, such as household cleaning products.  Heavy alcohol use.  An infection of the esophagus.This most often occurs in people who have a weakened immune system.  Radiation or chemotherapy treatment for cancer.  Certain diseases such as sarcoidosis, Crohn disease, and scleroderma.  What are the signs or symptoms? Symptoms of this condition include:  Difficult or painful swallowing.  Pain with swallowing acidic liquids, such as citrus juices.  Pain with burping.  Chest pain.  Difficulty breathing.  Nausea.  Vomiting.  Pain in the abdomen.  Weight loss.  Ulcers in the mouth.  Patches of white material in the mouth (candidiasis).  Fever.  Coughing up blood or vomiting blood.  Stool that is black, tarry, or bright red.  How is this diagnosed? Your health care provider will take a medical history and perform a physical exam. You may also have other tests, including:  An endoscopy to examine your stomach and esophagus with a small camera.  A test that measures the acidity level in your esophagus.  A test that measures how much pressure is on your esophagus.  A barium swallow or modified barium swallow to show the shape,  size, and functioning of your esophagus.  Allergy tests.  How is this treated? Treatment for this condition depends on the cause of your esophagitis. In some cases, steroids or other medicines may be given to help relieve your symptoms or to treat the underlying cause of your condition. You may have to make some lifestyle changes, such as:  Avoiding alcohol.  Quitting smoking.  Changing your diet.  Exercising.  Changing your sleep habits and your sleep environment.  Follow these instructions at home: Take these actions to decrease your discomfort and to help avoid complications. Diet  Follow a diet as recommended by your health care provider. This may involve avoiding foods and drinks such as: ? Coffee and tea (with or without caffeine). ? Drinks that contain alcohol. ? Energy drinks and sports drinks. ? Carbonated drinks or sodas. ? Chocolate and cocoa. ? Peppermint and mint flavorings. ? Garlic and onions. ? Horseradish. ? Spicy and acidic foods, including peppers, chili powder, curry powder, vinegar, hot sauces, and barbecue sauce. ? Citrus fruit juices and citrus fruits, such as oranges, lemons, and limes. ? Tomato-based foods, such as red sauce, chili, salsa, and pizza with red sauce. ? Fried and fatty foods, such as donuts, french fries, potato chips, and high-fat dressings. ? High-fat meats, such as hot dogs and fatty cuts of red and white meats, such as rib eye steak, sausage, ham, and bacon. ? High-fat dairy items, such as whole milk, butter, and cream cheese.  Eat small, frequent meals instead of large meals.  Avoid drinking large amounts of liquid with your meals.  Avoid eating meals during the 2-3 hours before bedtime.  Avoid lying down right   after you eat.  Do not exercise right after you eat.  Avoid foods and drinks that seem to make your symptoms worse. General instructions  Pay attention to any changes in your symptoms.  Take over-the-counter and  prescription medicines only as told by your health care provider. Do not take aspirin, ibuprofen, or other NSAIDs unless your health care provider told you to do so.  If you have trouble taking pills, use a pill splitter to decrease the size of the pill. This will decrease the chance of the pill getting stuck or injuring your esophagus on the way down. Also, drink water after you take a pill.  Do not use any tobacco products, including cigarettes, chewing tobacco, and e-cigarettes. If you need help quitting, ask your health care provider.  Wear loose-fitting clothing. Do not wear anything tight around your waist that causes pressure on your abdomen.  Raise (elevate) the head of your bed about 6 inches (15 cm).  Try to reduce your stress, such as with yoga or meditation. If you need help reducing stress, ask your health care provider.  If you are overweight, reduce your weight to an amount that is healthy for you. Ask your health care provider for guidance about a safe weight loss goal.  Keep all follow-up visits as told by your health care provider. This is important. Contact a health care provider if:  You have new symptoms.  You have unexplained weight loss.  You have difficulty swallowing, or it hurts to swallow.  You have wheezing or a persistent cough.  Your symptoms do not improve with treatment.  You have frequent heartburn for more than two weeks. Get help right away if:  You have severe pain in your arms, neck, jaw, teeth, or back.  You feel sweaty, dizzy, or light-headed.  You have chest pain or shortness of breath.  You vomit and your vomit looks like blood or coffee grounds.  Your stool is bloody or black.  You have a fever.  You cannot swallow, drink, or eat. This information is not intended to replace advice given to you by your health care provider. Make sure you discuss any questions you have with your health care provider. Document Released: 11/12/2004  Document Revised: 03/12/2016 Document Reviewed: 01/30/2015 Elsevier Interactive Patient Education  2018 [employer]     

## 2018-01-24 NOTE — Progress Notes (Signed)
Physical Therapy Treatment Patient Details Name: Donna Summers MRN: [id number] DOB: June 02, 1920 Today's Date: 01/24/2018    History of Present Illness Donna Summers is a 84 year old female with medical history significant for hypertension, hyperlipidemia and GERD; recent admission 01/06/2018 for abdominal pain.  Pt was evaluated by surgery at that time and thought surgery not indicated at that time.  Pt has apparently c/o some dysphagia, abdominal discomfort and nausea; Patient had barium swallow which shows marked presbyesophagus and possible narrowing at the GE junction with hang up of the pill, p ti s scheduled  for EGD and balloon dilatation  on 01/22/18    PT Comments    Pt pleasant, AxO x 3.  Assisted OOB to bathroom.  Pt had unknown loose BM but was wearing a brief.  Assisted with hygiene then amb a greater distance in hallway.  Positioned in recliner to comfort.  Pt plans to D/C to SNF later today for ST Rehab.   Follow Up Recommendations  SNF     Equipment Recommendations  None recommended by PT    Recommendations for Other Services       Precautions / Restrictions      Mobility  Bed Mobility Overal bed mobility: Needs Assistance Bed Mobility: Supine to Sit     Supine to sit: Supervision     General bed mobility comments: increased time  Transfers Overall transfer level: Needs assistance Equipment used: Rolling walker (2 wheeled) Transfers: Sit to/from Stand;Stand Pivot Transfers Sit to Stand: Min assist;Min guard Stand pivot transfers: Min guard;Min assist       General transfer comment: 25% VC's on safety with turns  Ambulation/Gait Ambulation/Gait assistance: Min assist Ambulation Distance (Feet): 80 Feet Assistive device: Rolling walker (2 wheeled) Gait Pattern/deviations: Step-through pattern;Decreased stride length;Trunk flexed;Narrow base of support Gait velocity: decreased   General Gait Details: 25% VC's safety with turns   Social research officer, governmenttairs             Wheelchair Mobility    Modified Rankin (Stroke Patients Only)       Balance                                            Cognition Arousal/Alertness: Awake/alert Behavior During Therapy: WFL for tasks assessed/performed Overall Cognitive Status: Within Functional Limits for tasks assessed                                 General Comments: improved      Exercises      General Comments        Pertinent Vitals/Pain Pain Assessment: No/denies pain    Home Living                      Prior Function            PT Goals (current goals can now be found in the care plan section) Progress towards PT goals: Progressing toward goals    Frequency    Min 3X/week      PT Plan Current plan remains appropriate    Co-evaluation              AM-PAC PT "6 Clicks" Daily Activity  Outcome Measure  Difficulty turning over in bed (including adjusting bedclothes, sheets and blankets)?: A Little Difficulty moving from lying on  back to sitting on the side of the bed? : A Little Difficulty sitting down on and standing up from a chair with arms (e.g., wheelchair, bedside commode, etc,.)?: A Little Help needed moving to and from a bed to chair (including a wheelchair)?: A Little Help needed walking in hospital room?: A Little Help needed climbing 3-5 steps with a railing? : A Lot 6 Click Score: 17    End of Session Equipment Utilized During Treatment: Gait belt Activity Tolerance: Patient tolerated treatment well Patient left: with call bell/phone within reach;with family/visitor present;in chair Nurse Communication: Mobility status PT Visit Diagnosis: Unsteadiness on feet (R26.81)     Time: 1610-9604 PT Time Calculation (min) (ACUTE ONLY): 29 min  Charges:  $Gait Training: 8-22 mins $Therapeutic Activity: 8-22 mins                    G Codes:       Felecia Shelling  PTA [facility]  Acute  Rehab Pager      [phone]

## 2018-01-24 NOTE — Progress Notes (Signed)
Occupational Therapy Evaluation Patient Details Name: Donna Summers MRN: [id number] DOB: [birth year] Today's Date: 01/24/2018    History of Present Illness Donna Summers is a 84 year old female with medical history significant for hypertension, hyperlipidemia and GERD; recent admission 01/06/2018 for abdominal pain.  Pt was evaluated by surgery at that time and thought surgery not indicated at that time.  Pt has apparently c/o some dysphagia, abdominal discomfort and nausea; Patient had barium swallow which shows marked presbyesophagus and possible narrowing at the GE junction with hang up of the pill, p ti s scheduled  for EGD and balloon dilatation  on 01/22/18   Clinical Impression   Pt admitted with dysphagia. Pt currently with functional limitations due to the deficits listed below (see OT Problem List).  Pt will benefit from skilled OT to increase their safety and independence with ADL and functional mobility for ADL to facilitate discharge to venue listed below.      Follow Up Recommendations  SNF    Equipment Recommendations  None recommended by OT    Recommendations for Other Services       Precautions / Restrictions Precautions Precautions: Fall      Mobility Bed Mobility               General bed mobility comments: pt in chair  Transfers Overall transfer level: Needs assistance Equipment used: Rolling walker (2 wheeled) Transfers: Sit to/from [employer] CorporationStand;Stand Pivot Transfers Sit to Stand: Min assist Stand pivot transfers: Min assist            Balance Overall balance assessment: Needs assistance   Sitting balance-Leahy Scale: Good     Standing balance support: During functional activity;Bilateral upper extremity supported   Standing balance comment: reliant on UEs static an dynamic                           ADL either performed or assessed with clinical judgement   ADL Overall ADL's : Needs assistance/impaired Eating/Feeding: Set  up;Sitting   Grooming: Minimal assistance;Standing   Upper Body Bathing: Set up;Sitting   Lower Body Bathing: Minimal assistance;Sit to/from stand;Cueing for safety;Cueing for sequencing   Upper Body Dressing : Set up;Sitting   Lower Body Dressing: Minimal assistance;Sit to/from stand;Cueing for safety;Cueing for sequencing   Toilet Transfer: Minimal assistance;RW;Comfort height toilet;Ambulation;Cueing for sequencing;Cueing for safety   Toileting- Clothing Manipulation and Hygiene: Minimal assistance;Sit to/from stand;Cueing for safety;Cueing for sequencing       Functional mobility during ADLs: Minimal assistance;Rolling walker       Vision Patient Visual Report: No change from baseline              Pertinent Vitals/Pain Pain Assessment: No/denies pain           Communication Communication Communication: No difficulties   Cognition Arousal/Alertness: Awake/alert Behavior During Therapy: WFL for tasks assessed/performed Overall Cognitive Status: Within Functional Limits for tasks assessed Area of Impairment: Memory                                              Home Living Family/patient expects to be discharged to:: Skilled nursing facility   Available Help at Discharge: Family Type of Home: House Home Access: Stairs to enter Entergy CorporationEntrance Stairs-Number of Steps: 5   Home Layout: Two level;Able to live on main level with bedroom/bathroom  Home Equipment: Walker - 2 wheels   Additional Comments: plan is for pt to stay with her son here in [place] for a few days and then return to her ALF or go to rehab and then back to SNF      Prior Functioning/Environment          Comments: lives in ALF, IND ADLS, amb with RW at baseline--pt admittedly has been less active recently (family confirms)        OT Problem List: Decreased strength;Impaired balance (sitting and/or standing);Decreased knowledge of use of DME or AE          OT Goals(Current goals can be found in the care plan section) Acute Rehab OT Goals Patient Stated Goal: back to her home  OT Frequency:      AM-PAC PT "6 Clicks" Daily Activity     Outcome Measure Help from another person eating meals?: None Help from another person taking care of personal grooming?: A Little   Help from another person bathing (including washing, rinsing, drying)?: A Little Help from another person to put on and taking off regular upper body clothing?: None Help from another person to put on and taking off regular lower body clothing?: A Little 6 Click Score: 17   End of Session Equipment Utilized During Treatment: Rolling walker Nurse Communication: Mobility status  Activity Tolerance: Patient tolerated treatment well Patient left: in chair;with chair alarm set  OT Visit Diagnosis: Unsteadiness on feet (R26.81);History of falling (Z91.81);Muscle weakness (generalized) (M62.81)                Time: 0981-1914 OT Time Calculation (min): 15 min Charges:  OT General Charges $OT Visit: 1 Visit OT Evaluation $OT Eval Moderate Complexity: 1 Mod G-Codes:     Lise Auer, OT [phone]  Einar Crow D 01/24/2018, 1:13 PM

## 2018-01-24 NOTE — Care Management Important Message (Signed)
Important Message  Patient Details  Name: Donna Summers MRN: [id number] Date of Birth: 1919/11/06   Medicare Important Message Given:  Yes    Caren MacadamFuller, Pascal Stiggers 01/24/2018, 10:07 AMImportant Message  Patient Details  Name: Donna Summers MRN: [id number] Date of Birth: 1919/11/06   Medicare Important Message Given:  Yes    Caren MacadamFuller, Tasha Diaz 01/24/2018, 10:07 AM

## 2018-01-27 ENCOUNTER — Other Ambulatory Visit: Payer: Self-pay

## 2018-01-27 ENCOUNTER — Emergency Department (HOSPITAL_COMMUNITY): Payer: Medicare Other

## 2018-01-27 ENCOUNTER — Inpatient Hospital Stay (HOSPITAL_COMMUNITY)
Admission: EM | Admit: 2018-01-27 | Discharge: 2018-02-02 | DRG: 470 | Disposition: A | Discharge status: Skilled Nursing Facility | Payer: Medicare Other | Attending: Family Medicine | Admitting: Family Medicine

## 2018-01-27 ENCOUNTER — Encounter (HOSPITAL_COMMUNITY): Payer: Self-pay | Admitting: Emergency Medicine

## 2018-01-27 DIAGNOSIS — W010XXA Fall on same level from slipping, tripping and stumbling without subsequent striking against object, initial encounter: Secondary | ICD-10-CM | POA: Diagnosis present

## 2018-01-27 DIAGNOSIS — D62 Acute posthemorrhagic anemia: Secondary | ICD-10-CM | POA: Diagnosis present

## 2018-01-27 DIAGNOSIS — Z79899 Other long term (current) drug therapy: Secondary | ICD-10-CM

## 2018-01-27 DIAGNOSIS — Y92122 Bedroom in nursing home as the place of occurrence of the external cause: Secondary | ICD-10-CM

## 2018-01-27 DIAGNOSIS — Z66 Do not resuscitate: Secondary | ICD-10-CM | POA: Diagnosis present

## 2018-01-27 DIAGNOSIS — K219 Gastro-esophageal reflux disease without esophagitis: Secondary | ICD-10-CM | POA: Diagnosis present

## 2018-01-27 DIAGNOSIS — L89152 Pressure ulcer of sacral region, stage 2: Secondary | ICD-10-CM | POA: Diagnosis present

## 2018-01-27 DIAGNOSIS — R0902 Hypoxemia: Secondary | ICD-10-CM

## 2018-01-27 DIAGNOSIS — N183 Chronic kidney disease, stage 3 unspecified: Secondary | ICD-10-CM | POA: Diagnosis present

## 2018-01-27 DIAGNOSIS — I10 Essential (primary) hypertension: Secondary | ICD-10-CM | POA: Diagnosis present

## 2018-01-27 DIAGNOSIS — I160 Hypertensive urgency: Secondary | ICD-10-CM | POA: Diagnosis present

## 2018-01-27 DIAGNOSIS — Z888 Allergy status to other drugs, medicaments and biological substances status: Secondary | ICD-10-CM

## 2018-01-27 DIAGNOSIS — S72002A Fracture of unspecified part of neck of left femur, initial encounter for closed fracture: Secondary | ICD-10-CM

## 2018-01-27 DIAGNOSIS — L8990 Pressure ulcer of unspecified site, unspecified stage: Secondary | ICD-10-CM

## 2018-01-27 DIAGNOSIS — H409 Unspecified glaucoma: Secondary | ICD-10-CM | POA: Diagnosis present

## 2018-01-27 DIAGNOSIS — Z419 Encounter for procedure for purposes other than remedying health state, unspecified: Secondary | ICD-10-CM

## 2018-01-27 DIAGNOSIS — Z96649 Presence of unspecified artificial hip joint: Secondary | ICD-10-CM

## 2018-01-27 DIAGNOSIS — E785 Hyperlipidemia, unspecified: Secondary | ICD-10-CM | POA: Diagnosis present

## 2018-01-27 MED ORDER — ONDANSETRON HCL 4 MG/2ML IJ SOLN
INTRAMUSCULAR | Status: AC
  Filled 2018-01-27: qty 2

## 2018-01-27 MED ORDER — ONDANSETRON HCL 4 MG/2ML IJ SOLN
4.0000 mg | Freq: Once | INTRAMUSCULAR | Status: AC
  Administered 2018-01-28: 4 mg via INTRAVENOUS

## 2018-01-27 NOTE — ED Provider Notes (Signed)
[facility]-EMERGENCY DEPT Provider Note   CSN: [id number] Arrival date & time: 01/27/18  2245     History   Chief Complaint Chief Complaint  Patient presents with  . Fall  . Hip Injury    HPI Donna Summers is a 84 y.o. female.  Patient presents with L hip pain after falling at her living facility. She was squatting to put her shoes in a drawer and lost her balance and fell onto L hip. States did hit head and elbow too. No chest pain or SOB. No blood thinner use. Denies any preceding dizziness or lightheadedness. C/o L hip pain only.  The history is provided by the EMS personnel and the patient.  Fall  Associated symptoms include headaches. Pertinent negatives include no chest pain, no abdominal pain and no shortness of breath.    Past Medical History:  Diagnosis Date  . Constipation   . Constipation   . GERD (gastroesophageal reflux disease)   . Glaucoma   . History of carotid artery disease   . Hyperlipidemia   . Hypertension   . UTI (urinary tract infection)   . Vertigo     Patient Active Problem List   Diagnosis Date Noted  . Dysphagia 01/20/2018  . Acute lower UTI 01/20/2018  . Dehydration 01/20/2018  . Weakness generalized 01/20/2018  . Symptomatic cholelithiasis 01/19/2018  . Cholelithiasis 01/19/2018  . Cholecystitis 01/06/2018  . Constipation 01/06/2018  . History of urinary retention 01/06/2018  . Abdominal pain 01/06/2018  . Vitamin D deficiency 01/06/2018  . HLD (hyperlipidemia) 01/06/2018  . GERD (gastroesophageal reflux disease) 01/06/2018  . HTN (hypertension) 01/06/2018    Past Surgical History:  Procedure Laterality Date  . ESOPHAGOGASTRODUODENOSCOPY (EGD) WITH PROPOFOL N/A 01/22/2018   Procedure: ESOPHAGOGASTRODUODENOSCOPY (EGD) WITH PROPOFOL;  Surgeon: Graylin Shiver, MD;  Location: [facility];  Service: Endoscopy;  Laterality: N/A;  . UNKNOWN SURGICAL HISTORY       OB History   None      Home  Medications    Prior to Admission medications   Medication Sig Start Date End Date Taking? Authorizing Provider  acetaminophen (TYLENOL) 325 MG tablet Take 650 mg by mouth every 6 (six) hours as needed for mild pain or moderate pain.    [provider]  [facility] Apply 1 drop to eye 3 (three) times daily. LEFT EYE    [provider]  irbesartan (AVAPRO) 75 MG tablet Take 37.5 mg by mouth at bedtime. TAKES 1/2 TABLET    [provider]  meclizine (ANTIVERT) 12.5 MG tablet Take 12.5 mg by mouth 3 (three) times daily as needed for dizziness.    [provider]  pantoprazole (PROTONIX) 40 MG tablet Take 1 tablet (40 mg total) by mouth 2 (two) times daily. 01/24/18   Purohit, Salli Quarry, MD  polyethylene glycol (MIRALAX / GLYCOLAX) packet Take 17 g by mouth daily. 01/08/18   Lenox Ponds, MD  travoprost, benzalkonium, (TRAVATAN) 0.004 % ophthalmic solution Place 1 drop into the left eye at bedtime.    [provider]    Family History History reviewed. No pertinent family history.  Social History Social History   Tobacco Use  . Smoking status: Never Smoker  . Smokeless tobacco: Never Used  Substance Use Topics  . Alcohol use: Not Currently    Frequency: Never  . Drug use: Never     Allergies   Bactrim [sulfamethoxazole-trimethoprim] and Levaquin [levofloxacin]   Review of Systems Review of  Systems  Constitutional: Negative for activity change and appetite change.  HENT: Negative for congestion.   Respiratory: Negative for cough, chest tightness and shortness of breath.   Cardiovascular: Negative for chest pain.  Gastrointestinal: Negative for abdominal pain, nausea and vomiting.  Genitourinary: Negative for dysuria, hematuria, vaginal bleeding and vaginal discharge.  Musculoskeletal: Positive for arthralgias and myalgias. Negative for back pain and neck pain.  Skin: Positive for wound.  Neurological: Positive for  headaches. Negative for dizziness and weakness.    all other systems are negative except as noted in the HPI and PMH.    Physical Exam Updated Vital Signs BP (!) 226/98 (BP Location: Left Arm)   Pulse 82   Temp 97.8 F (36.6 C) (Oral)   Resp 17   SpO2 97%   Physical Exam  Constitutional: She is oriented to person, place, and time. She appears well-developed and well-nourished. No distress.  HENT:  Head: Normocephalic and atraumatic.  Mouth/Throat: Oropharynx is clear and moist. No oropharyngeal exudate.  Eyes: Pupils are equal, round, and reactive to light. Conjunctivae and EOM are normal.  Neck: Normal range of motion. Neck supple.  No C spine tenderness  Cardiovascular: Normal rate, regular rhythm, normal heart sounds and intact distal pulses.  No murmur heard. Pulmonary/Chest: Effort normal and breath sounds normal. No respiratory distress.  Abdominal: Soft. There is no tenderness. There is no rebound and no guarding.  Musculoskeletal: She exhibits tenderness and deformity. She exhibits no edema.  TTP L lateral hip with shortening and external rotation. Intact DP pulse Able to wiggle toes. Pelvis stable.  Neurological: She is alert and oriented to person, place, and time. No cranial nerve deficit. She exhibits normal muscle tone. Coordination normal.   5/5 strength throughout. CN 2-12 intact.Equal grip strength.   Skin: Skin is warm. Capillary refill takes less than 2 seconds.  Psychiatric: She has a normal mood and affect. Her behavior is normal.  Nursing note and vitals reviewed.    ED Treatments / Results  Labs (all labs ordered are listed, but only abnormal results are displayed) Labs Reviewed  CBC WITH DIFFERENTIAL/PLATELET - Abnormal; Notable for the following components:      Result Value   WBC 15.0 (*)    RDW 16.5 (*)    Neutro Abs 11.7 (*)    Monocytes Absolute 1.3 (*)    All other components within normal limits  COMPREHENSIVE METABOLIC PANEL - Abnormal;  Notable for the following components:   CO2 21 (*)    Glucose, Bld 127 (*)    BUN 21 (*)    Creatinine, Ser 1.31 (*)    Albumin 3.4 (*)    GFR calc non Af Amer 33 (*)    GFR calc Af Amer 39 (*)    All other components within normal limits  URINALYSIS, ROUTINE W REFLEX MICROSCOPIC    EKG EKG Interpretation  Date/Time:  Thursday January 27 2018 23:02:37 EDT Ventricular Rate:  83 PR Interval:    QRS Duration: 90 QT Interval:  382 QTC Calculation: 449 R Axis:   59 Text Interpretation:  Sinus rhythm Nonspecific repol abnormality, diffuse leads No significant change was found Interpretation limited secondary to artifact Confirmed by Glynn Octaveancour, Geselle Cardosa [phone]([id number]) on 01/27/2018 11:13:52 PM   Radiology Dg Chest 2 View  Result Date: 01/27/2018 CLINICAL DATA:  Preop left femoral neck fracture. EXAM: CHEST - 2 VIEW COMPARISON:  01/13/2018 CXR FINDINGS: Mild cardiomegaly with moderate aortic atherosclerosis. No aneurysm. Central vascular congestion without acute pneumonic consolidation.  Surgical anchors project over the left humeral head. IMPRESSION: Mild cardiomegaly without acute pulmonary disease. Aortic atherosclerosis. Electronically Signed   By: Tollie Eth M.D.   On: 01/27/2018 23:34   Ct Head Wo Contrast  Result Date: 01/27/2018 CLINICAL DATA:  Fall with hip injury EXAM: CT HEAD WITHOUT CONTRAST TECHNIQUE: Contiguous axial images were obtained from the base of the skull through the vertex without intravenous contrast. COMPARISON:  Radiograph 01/27/2018 FINDINGS: Brain: No acute territorial infarction, hemorrhage or intracranial mass is visualized. Moderate atrophy. Mild small vessel ischemic changes of the white matter. Ventricle size within normal limits Vascular: No hyperdense vessels.  Carotid vascular calcification. Skull: Normal. Negative for fracture or focal lesion. Sinuses/Orbits: Mild mucosal thickening in the maxillary and ethmoid sinuses. No acute orbital abnormality. Other: None.  IMPRESSION: 1. No CT evidence for acute intracranial abnormality. 2. Atrophy with small vessel ischemic changes of the white matter Electronically Signed   By: Jasmine Pang M.D.   On: 01/27/2018 23:41   Dg Hip Unilat With Pelvis 2-3 Views Left  Result Date: 01/27/2018 CLINICAL DATA:  Fall with pain to the left hip EXAM: DG HIP (WITH OR WITHOUT PELVIS) 2-3V LEFT COMPARISON:  CT 01/13/2018 FINDINGS: Residual contrast material within the colon. Pubic symphysis and rami are intact. The right femoral head projects in joint. Acute left femoral neck fracture with superior displacement of the trochanter. Left femoral head projects in joint. IMPRESSION: Acute displaced left femoral neck fracture Electronically Signed   By: Jasmine Pang M.D.   On: 01/27/2018 23:31    Procedures Procedures (including critical care time)  Medications Ordered in ED Medications - No data to display   Initial Impression / Assessment and Plan / ED Course  I have reviewed the triage vital signs and the nursing notes.  Pertinent labs & imaging results that were available during my care of the patient were reviewed by me and considered in my medical decision making (see chart for details).    Mechanical fall with L hip pain. Neurovascularly intact.  Imaging remarkable for L femoral neck fracture. CT head stable.  D/w Dr. Roda Shutters of orthopedics who will see patient in AM and requests transfer to [facility]. Labs stable. EKG unchanged.  Patient and family updated. Admission d/w Dr. Antionette Char.  Final Clinical Impressions(s) / ED Diagnoses   Final diagnoses:  Closed displaced fracture of left femoral neck [facility])    ED Discharge Orders    None       Glynn Octave, MD 01/28/18 [phone]

## 2018-01-27 NOTE — ED Notes (Signed)
Bed: [id number] Expected date:  Expected time:  Means of arrival:  Comments: 84 yo F/ Fall left hip pain

## 2018-01-27 NOTE — ED Triage Notes (Signed)
Pt BIB EMS from [facility] s/p fall with hip injury. Patient states she was putting her shoes in the bottom drawer of her wardrobe, and when she went to stand up her foot slipped and she fell into the wall and onto the floor. Left hip shortening and rotation, small skin tear to left elbow, bandage in place and bleeding controlled. Patient is A&O x4, no LOC per patient and patient does not take blood thinner. Towel wrap in place by EMS.

## 2018-01-28 ENCOUNTER — Inpatient Hospital Stay (HOSPITAL_COMMUNITY): Payer: Medicare Other | Admitting: Certified Registered"

## 2018-01-28 ENCOUNTER — Encounter (HOSPITAL_COMMUNITY): Payer: Self-pay | Admitting: Family Medicine

## 2018-01-28 ENCOUNTER — Encounter (HOSPITAL_COMMUNITY)
Admission: EM | Disposition: A | Discharge status: Skilled Nursing Facility | Payer: Self-pay | Source: Home / Self Care | Attending: Family Medicine

## 2018-01-28 ENCOUNTER — Inpatient Hospital Stay (HOSPITAL_COMMUNITY): Payer: Medicare Other

## 2018-01-28 DIAGNOSIS — N183 Chronic kidney disease, stage 3 unspecified: Secondary | ICD-10-CM | POA: Diagnosis present

## 2018-01-28 DIAGNOSIS — J9621 Acute and chronic respiratory failure with hypoxia: Secondary | ICD-10-CM | POA: Diagnosis not present

## 2018-01-28 DIAGNOSIS — I10 Essential (primary) hypertension: Secondary | ICD-10-CM | POA: Diagnosis not present

## 2018-01-28 DIAGNOSIS — I160 Hypertensive urgency: Secondary | ICD-10-CM | POA: Insufficient documentation

## 2018-01-28 DIAGNOSIS — K210 Gastro-esophageal reflux disease with esophagitis: Secondary | ICD-10-CM | POA: Diagnosis not present

## 2018-01-28 DIAGNOSIS — Z66 Do not resuscitate: Secondary | ICD-10-CM | POA: Diagnosis present

## 2018-01-28 DIAGNOSIS — S72002A Fracture of unspecified part of neck of left femur, initial encounter for closed fracture: Secondary | ICD-10-CM | POA: Diagnosis present

## 2018-01-28 DIAGNOSIS — E785 Hyperlipidemia, unspecified: Secondary | ICD-10-CM | POA: Diagnosis present

## 2018-01-28 DIAGNOSIS — Y92122 Bedroom in nursing home as the place of occurrence of the external cause: Secondary | ICD-10-CM | POA: Diagnosis not present

## 2018-01-28 DIAGNOSIS — L89152 Pressure ulcer of sacral region, stage 2: Secondary | ICD-10-CM | POA: Diagnosis present

## 2018-01-28 DIAGNOSIS — H409 Unspecified glaucoma: Secondary | ICD-10-CM | POA: Diagnosis present

## 2018-01-28 DIAGNOSIS — D62 Acute posthemorrhagic anemia: Secondary | ICD-10-CM | POA: Diagnosis present

## 2018-01-28 DIAGNOSIS — Z888 Allergy status to other drugs, medicaments and biological substances status: Secondary | ICD-10-CM | POA: Diagnosis not present

## 2018-01-28 DIAGNOSIS — M79609 Pain in unspecified limb: Secondary | ICD-10-CM | POA: Diagnosis not present

## 2018-01-28 DIAGNOSIS — W010XXA Fall on same level from slipping, tripping and stumbling without subsequent striking against object, initial encounter: Secondary | ICD-10-CM | POA: Diagnosis present

## 2018-01-28 DIAGNOSIS — K219 Gastro-esophageal reflux disease without esophagitis: Secondary | ICD-10-CM | POA: Diagnosis present

## 2018-01-28 DIAGNOSIS — R0902 Hypoxemia: Secondary | ICD-10-CM | POA: Diagnosis present

## 2018-01-28 DIAGNOSIS — Z79899 Other long term (current) drug therapy: Secondary | ICD-10-CM | POA: Diagnosis not present

## 2018-01-28 HISTORY — PX: ANTERIOR APPROACH HEMI HIP ARTHROPLASTY: SHX6690

## 2018-01-28 LAB — CBC WITH DIFFERENTIAL/PLATELET
BASOS ABS: 0 10*3/uL (ref 0.0–0.1)
Basophils Relative: 0 %
Eosinophils Absolute: 0.1 10*3/uL (ref 0.0–0.7)
Eosinophils Relative: 0 %
HEMATOCRIT: 39.1 % (ref 36.0–46.0)
HEMOGLOBIN: 12.7 g/dL (ref 12.0–15.0)
LYMPHS ABS: 1.9 10*3/uL (ref 0.7–4.0)
LYMPHS PCT: 13 %
MCH: 28.7 pg (ref 26.0–34.0)
MCHC: 32.5 g/dL (ref 30.0–36.0)
MCV: 88.5 fL (ref 78.0–100.0)
Monocytes Absolute: 1.3 10*3/uL — ABNORMAL HIGH (ref 0.1–1.0)
Monocytes Relative: 8 %
NEUTROS ABS: 11.7 10*3/uL — AB (ref 1.7–7.7)
NEUTROS PCT: 79 %
PLATELETS: 248 10*3/uL (ref 150–400)
RBC: 4.42 MIL/uL (ref 3.87–5.11)
RDW: 16.5 % — ABNORMAL HIGH (ref 11.5–15.5)
WBC: 15 10*3/uL — AB (ref 4.0–10.5)

## 2018-01-28 LAB — CBC
HEMATOCRIT: 36.7 % (ref 36.0–46.0)
Hemoglobin: 11.5 g/dL — ABNORMAL LOW (ref 12.0–15.0)
MCH: 28 pg (ref 26.0–34.0)
MCHC: 31.3 g/dL (ref 30.0–36.0)
MCV: 89.5 fL (ref 78.0–100.0)
Platelets: 196 10*3/uL (ref 150–400)
RBC: 4.1 MIL/uL (ref 3.87–5.11)
RDW: 16.5 % — AB (ref 11.5–15.5)
WBC: 12.6 10*3/uL — AB (ref 4.0–10.5)

## 2018-01-28 LAB — CREATININE, SERUM
Creatinine, Ser: 1.13 mg/dL — ABNORMAL HIGH (ref 0.44–1.00)
GFR calc non Af Amer: 40 mL/min — ABNORMAL LOW (ref >60)
GFR, EST AFRICAN AMERICAN: 46 mL/min — AB (ref >60)

## 2018-01-28 LAB — COMPREHENSIVE METABOLIC PANEL
ALK PHOS: 72 U/L (ref 38–126)
ALT: 18 U/L (ref 14–54)
AST: 28 U/L (ref 15–41)
Albumin: 3.4 g/dL — ABNORMAL LOW (ref 3.5–5.0)
Anion gap: 11 (ref 5–15)
BUN: 21 mg/dL — ABNORMAL HIGH (ref 6–20)
CALCIUM: 9.2 mg/dL (ref 8.9–10.3)
CHLORIDE: 108 mmol/L (ref 101–111)
CO2: 21 mmol/L — ABNORMAL LOW (ref 22–32)
CREATININE: 1.31 mg/dL — AB (ref 0.44–1.00)
GFR, EST AFRICAN AMERICAN: 39 mL/min — AB (ref >60)
GFR, EST NON AFRICAN AMERICAN: 33 mL/min — AB (ref >60)
Glucose, Bld: 127 mg/dL — ABNORMAL HIGH (ref 65–99)
Potassium: 3.5 mmol/L (ref 3.5–5.1)
Sodium: 140 mmol/L (ref 135–145)
Total Bilirubin: 0.7 mg/dL (ref 0.3–1.2)
Total Protein: 6.9 g/dL (ref 6.5–8.1)

## 2018-01-28 SURGERY — HEMIARTHROPLASTY, HIP, DIRECT ANTERIOR APPROACH, FOR FRACTURE
Anesthesia: General | Laterality: Left

## 2018-01-28 MED ORDER — HYDROCODONE-ACETAMINOPHEN 5-325 MG PO TABS
1.0000 | ORAL_TABLET | ORAL | Status: DC | PRN
  Administered 2018-01-30: 2 via ORAL
  Filled 2018-01-28: qty 2

## 2018-01-28 MED ORDER — SUCCINYLCHOLINE CHLORIDE 200 MG/10ML IV SOSY
PREFILLED_SYRINGE | INTRAVENOUS | Status: AC
  Filled 2018-01-28: qty 10

## 2018-01-28 MED ORDER — ROCURONIUM BROMIDE 10 MG/ML (PF) SYRINGE
PREFILLED_SYRINGE | INTRAVENOUS | Status: DC | PRN
  Administered 2018-01-28: 30 mg via INTRAVENOUS
  Administered 2018-01-28: 10 mg via INTRAVENOUS
  Administered 2018-01-28: 40 mg via INTRAVENOUS

## 2018-01-28 MED ORDER — TRANEXAMIC ACID 1000 MG/10ML IV SOLN
INTRAVENOUS | Status: DC | PRN
  Administered 2018-01-28: 2000 mg via TOPICAL

## 2018-01-28 MED ORDER — PHENYLEPHRINE 40 MCG/ML (10ML) SYRINGE FOR IV PUSH (FOR BLOOD PRESSURE SUPPORT)
PREFILLED_SYRINGE | INTRAVENOUS | Status: AC
  Filled 2018-01-28: qty 20

## 2018-01-28 MED ORDER — HYDROCODONE-ACETAMINOPHEN 7.5-325 MG PO TABS: 1.0000 | ORAL_TABLET | ORAL | Status: DC | PRN

## 2018-01-28 MED ORDER — SUGAMMADEX SODIUM 200 MG/2ML IV SOLN
INTRAVENOUS | Status: DC | PRN
  Administered 2018-01-28: 200 mg via INTRAVENOUS

## 2018-01-28 MED ORDER — POLYETHYLENE GLYCOL 3350 17 G PO PACK
17.0000 g | PACK | Freq: Every day | ORAL | Status: DC
  Administered 2018-01-29 – 2018-02-02 (×5): 17 g via ORAL
  Filled 2018-01-28 (×5): qty 1

## 2018-01-28 MED ORDER — FENTANYL CITRATE (PF) 250 MCG/5ML IJ SOLN
INTRAMUSCULAR | Status: DC | PRN
  Administered 2018-01-28 (×3): 50 ug via INTRAVENOUS
  Administered 2018-01-28 (×3): 100 ug via INTRAVENOUS
  Administered 2018-01-28 (×2): 50 ug via INTRAVENOUS

## 2018-01-28 MED ORDER — PHENOL 1.4 % MT LIQD: 1.0000 | OROMUCOSAL | Status: DC | PRN

## 2018-01-28 MED ORDER — PHENYLEPHRINE HCL 10 MG/ML IJ SOLN
INTRAMUSCULAR | Status: DC | PRN
  Administered 2018-01-28: 40 ug via INTRAVENOUS
  Administered 2018-01-28 (×4): 80 ug via INTRAVENOUS

## 2018-01-28 MED ORDER — LIDOCAINE 2% (20 MG/ML) 5 ML SYRINGE
INTRAMUSCULAR | Status: DC | PRN
  Administered 2018-01-28: 100 mg via INTRAVENOUS

## 2018-01-28 MED ORDER — ALUM & MAG HYDROXIDE-SIMETH 200-200-20 MG/5ML PO SUSP: 30.0000 mL | ORAL | Status: DC | PRN

## 2018-01-28 MED ORDER — SUGAMMADEX SODIUM 500 MG/5ML IV SOLN
INTRAVENOUS | Status: AC
  Filled 2018-01-28: qty 5

## 2018-01-28 MED ORDER — TRANEXAMIC ACID 1000 MG/10ML IV SOLN
2000.0000 mg | Freq: Once | INTRAVENOUS | Status: DC
  Filled 2018-01-28: qty 20

## 2018-01-28 MED ORDER — 0.9 % SODIUM CHLORIDE (POUR BTL) OPTIME
TOPICAL | Status: DC | PRN
Start: 2018-01-28 — End: 2018-01-28
  Administered 2018-01-28: 1000 mL

## 2018-01-28 MED ORDER — DORZOLAMIDE HCL 2 % OP SOLN
1.0000 [drp] | Freq: Three times a day (TID) | OPHTHALMIC | Status: DC
  Administered 2018-01-29 – 2018-02-02 (×13): 1 [drp] via OPHTHALMIC
  Filled 2018-01-28: qty 10

## 2018-01-28 MED ORDER — KETOROLAC TROMETHAMINE 15 MG/ML IJ SOLN
7.5000 mg | Freq: Four times a day (QID) | INTRAMUSCULAR | Status: AC
  Administered 2018-01-28 – 2018-01-29 (×4): 7.5 mg via INTRAVENOUS
  Filled 2018-01-28 (×4): qty 1

## 2018-01-28 MED ORDER — PROPOFOL 10 MG/ML IV BOLUS
INTRAVENOUS | Status: DC | PRN
  Administered 2018-01-28: 110 mg via INTRAVENOUS

## 2018-01-28 MED ORDER — LACTATED RINGERS IV SOLN
INTRAVENOUS | Status: DC
  Administered 2018-01-28 (×2): via INTRAVENOUS

## 2018-01-28 MED ORDER — ONDANSETRON HCL 4 MG/2ML IJ SOLN
INTRAMUSCULAR | Status: DC | PRN
  Administered 2018-01-28: 4 mg via INTRAVENOUS

## 2018-01-28 MED ORDER — HYDRALAZINE HCL 20 MG/ML IJ SOLN
10.0000 mg | INTRAMUSCULAR | Status: DC | PRN
  Administered 2018-01-28: 10 mg via INTRAVENOUS
  Filled 2018-01-28: qty 1

## 2018-01-28 MED ORDER — PANTOPRAZOLE SODIUM 40 MG IV SOLR
40.0000 mg | Freq: Two times a day (BID) | INTRAVENOUS | Status: DC
  Administered 2018-01-28 – 2018-01-29 (×3): 40 mg via INTRAVENOUS
  Filled 2018-01-28 (×3): qty 40

## 2018-01-28 MED ORDER — MORPHINE SULFATE (PF) 2 MG/ML IV SOLN: 0.5000 mg | INTRAVENOUS | Status: DC | PRN

## 2018-01-28 MED ORDER — METHOCARBAMOL 1000 MG/10ML IJ SOLN
500.0000 mg | Freq: Four times a day (QID) | INTRAVENOUS | Status: DC | PRN
  Administered 2018-01-28: 500 mg via INTRAVENOUS
  Filled 2018-01-28: qty 550
  Filled 2018-01-28: qty 5

## 2018-01-28 MED ORDER — EPHEDRINE 5 MG/ML INJ
INTRAVENOUS | Status: AC
  Filled 2018-01-28: qty 10

## 2018-01-28 MED ORDER — FENTANYL CITRATE (PF) 100 MCG/2ML IJ SOLN
INTRAMUSCULAR | Status: AC
  Administered 2018-01-28: 10 ug via INTRAVENOUS
  Filled 2018-01-28: qty 2

## 2018-01-28 MED ORDER — MENTHOL 3 MG MT LOZG: 1.0000 | LOZENGE | OROMUCOSAL | Status: DC | PRN

## 2018-01-28 MED ORDER — MEPERIDINE HCL 50 MG/ML IJ SOLN: 6.2500 mg | INTRAMUSCULAR | Status: DC | PRN

## 2018-01-28 MED ORDER — METOCLOPRAMIDE HCL 5 MG/ML IJ SOLN: 5.0000 mg | Freq: Three times a day (TID) | INTRAMUSCULAR | Status: DC | PRN

## 2018-01-28 MED ORDER — ENOXAPARIN SODIUM 40 MG/0.4ML ~~LOC~~ SOLN: 40.0000 mg | Freq: Every day | SUBCUTANEOUS | 0 refills | Status: DC

## 2018-01-28 MED ORDER — ONDANSETRON HCL 4 MG/2ML IJ SOLN
INTRAMUSCULAR | Status: AC
  Filled 2018-01-28: qty 2

## 2018-01-28 MED ORDER — ONDANSETRON HCL 4 MG/2ML IJ SOLN
INTRAMUSCULAR | Status: AC
  Administered 2018-01-28: 4 mg via INTRAVENOUS
  Filled 2018-01-28: qty 2

## 2018-01-28 MED ORDER — POVIDONE-IODINE 10 % EX SWAB
2.0000 "application " | Freq: Once | CUTANEOUS | Status: AC
  Administered 2018-01-28: 2 via TOPICAL

## 2018-01-28 MED ORDER — ONDANSETRON HCL 4 MG/2ML IJ SOLN
4.0000 mg | Freq: Once | INTRAMUSCULAR | Status: AC | PRN
  Administered 2018-01-28: 4 mg via INTRAVENOUS

## 2018-01-28 MED ORDER — PROMETHAZINE HCL 25 MG/ML IJ SOLN
6.2500 mg | Freq: Once | INTRAMUSCULAR | Status: AC
  Administered 2018-01-28: 6.25 mg via INTRAVENOUS

## 2018-01-28 MED ORDER — FENTANYL CITRATE (PF) 100 MCG/2ML IJ SOLN
10.0000 ug | Freq: Once | INTRAMUSCULAR | Status: AC
  Administered 2018-01-28: 10 ug via INTRAVENOUS

## 2018-01-28 MED ORDER — DEXAMETHASONE SODIUM PHOSPHATE 10 MG/ML IJ SOLN
INTRAMUSCULAR | Status: DC | PRN
  Administered 2018-01-28: 5 mg via INTRAVENOUS

## 2018-01-28 MED ORDER — FENTANYL CITRATE (PF) 250 MCG/5ML IJ SOLN
INTRAMUSCULAR | Status: AC
  Filled 2018-01-28: qty 5

## 2018-01-28 MED ORDER — CEFAZOLIN SODIUM-DEXTROSE 2-4 GM/100ML-% IV SOLN
2.0000 g | Freq: Two times a day (BID) | INTRAVENOUS | Status: AC
  Administered 2018-01-29 (×2): 2 g via INTRAVENOUS
  Filled 2018-01-28 (×2): qty 100

## 2018-01-28 MED ORDER — BRIMONIDINE TARTRATE 0.15 % OP SOLN
1.0000 [drp] | Freq: Three times a day (TID) | OPHTHALMIC | Status: DC
  Administered 2018-01-29 – 2018-02-02 (×13): 1 [drp] via OPHTHALMIC
  Filled 2018-01-28: qty 5

## 2018-01-28 MED ORDER — METHOCARBAMOL 500 MG PO TABS
500.0000 mg | ORAL_TABLET | Freq: Four times a day (QID) | ORAL | Status: DC | PRN
  Administered 2018-01-30: 500 mg via ORAL
  Filled 2018-01-28 (×2): qty 1

## 2018-01-28 MED ORDER — HYDROCODONE-ACETAMINOPHEN 7.5-325 MG PO TABS: 1.0000 | ORAL_TABLET | Freq: Four times a day (QID) | ORAL | 0 refills | Status: DC | PRN

## 2018-01-28 MED ORDER — DEXAMETHASONE SODIUM PHOSPHATE 10 MG/ML IJ SOLN
INTRAMUSCULAR | Status: AC
  Filled 2018-01-28: qty 1

## 2018-01-28 MED ORDER — ACETAMINOPHEN 325 MG PO TABS
325.0000 mg | ORAL_TABLET | Freq: Four times a day (QID) | ORAL | Status: DC | PRN
  Administered 2018-01-30 – 2018-01-31 (×3): 650 mg via ORAL
  Filled 2018-01-28 (×2): qty 2
  Filled 2018-01-28: qty 1
  Filled 2018-01-28 (×2): qty 2

## 2018-01-28 MED ORDER — DOCUSATE SODIUM 100 MG PO CAPS
100.0000 mg | ORAL_CAPSULE | Freq: Two times a day (BID) | ORAL | Status: DC
  Administered 2018-01-29 – 2018-01-31 (×7): 100 mg via ORAL
  Filled 2018-01-28 (×7): qty 1

## 2018-01-28 MED ORDER — SODIUM CHLORIDE 0.9 % IV SOLN
INTRAVENOUS | Status: DC
  Administered 2018-01-29: 12:00:00 via INTRAVENOUS

## 2018-01-28 MED ORDER — SUGAMMADEX SODIUM 200 MG/2ML IV SOLN
INTRAVENOUS | Status: AC
  Filled 2018-01-28: qty 2

## 2018-01-28 MED ORDER — VANCOMYCIN HCL 1000 MG IV SOLR
INTRAVENOUS | Status: AC
  Filled 2018-01-28: qty 1000

## 2018-01-28 MED ORDER — BRIMONIDINE-DORZOLAMIDE 0.15-2 % OP SOLN: 1.0000 [drp] | Freq: Three times a day (TID) | OPHTHALMIC | Status: DC

## 2018-01-28 MED ORDER — FENTANYL CITRATE (PF) 100 MCG/2ML IJ SOLN
50.0000 ug | Freq: Once | INTRAMUSCULAR | Status: AC
  Administered 2018-01-28: 50 ug via INTRAVENOUS
  Filled 2018-01-28: qty 2

## 2018-01-28 MED ORDER — ONDANSETRON HCL 4 MG/2ML IJ SOLN
INTRAMUSCULAR | Status: AC
Start: 2018-01-28 — End: ?
  Filled 2018-01-28: qty 2

## 2018-01-28 MED ORDER — ENOXAPARIN SODIUM 30 MG/0.3ML ~~LOC~~ SOLN
30.0000 mg | SUBCUTANEOUS | Status: DC
  Administered 2018-01-29 – 2018-02-01 (×4): 30 mg via SUBCUTANEOUS
  Filled 2018-01-28 (×4): qty 0.3

## 2018-01-28 MED ORDER — METOCLOPRAMIDE HCL 5 MG PO TABS: 5.0000 mg | ORAL_TABLET | Freq: Three times a day (TID) | ORAL | Status: DC | PRN

## 2018-01-28 MED ORDER — ALBUTEROL SULFATE (2.5 MG/3ML) 0.083% IN NEBU
2.5000 mg | INHALATION_SOLUTION | RESPIRATORY_TRACT | Status: DC | PRN
Start: 2018-01-28 — End: 2018-02-02

## 2018-01-28 MED ORDER — HYDRALAZINE HCL 20 MG/ML IJ SOLN: 15.0000 mg | INTRAMUSCULAR | Status: DC | PRN

## 2018-01-28 MED ORDER — PROPOFOL 10 MG/ML IV BOLUS
INTRAVENOUS | Status: AC
  Filled 2018-01-28: qty 20

## 2018-01-28 MED ORDER — VANCOMYCIN HCL 1000 MG IV SOLR
INTRAVENOUS | Status: DC | PRN
  Administered 2018-01-28: 1000 mg

## 2018-01-28 MED ORDER — PROMETHAZINE HCL 25 MG/ML IJ SOLN
INTRAMUSCULAR | Status: AC
  Administered 2018-01-28: 6.25 mg via INTRAVENOUS
  Filled 2018-01-28: qty 1

## 2018-01-28 MED ORDER — FENTANYL CITRATE (PF) 100 MCG/2ML IJ SOLN: 25.0000 ug | INTRAMUSCULAR | Status: DC | PRN

## 2018-01-28 MED ORDER — LIDOCAINE 2% (20 MG/ML) 5 ML SYRINGE
INTRAMUSCULAR | Status: AC
  Filled 2018-01-28: qty 10

## 2018-01-28 MED ORDER — ONDANSETRON HCL 4 MG/2ML IJ SOLN
4.0000 mg | Freq: Four times a day (QID) | INTRAMUSCULAR | Status: DC | PRN
Start: 2018-01-28 — End: 2018-02-02

## 2018-01-28 MED ORDER — POTASSIUM CHLORIDE IN NACL 20-0.45 MEQ/L-% IV SOLN
INTRAVENOUS | Status: AC
  Administered 2018-01-28: 03:00:00 via INTRAVENOUS
  Filled 2018-01-28: qty 1000

## 2018-01-28 MED ORDER — SODIUM CHLORIDE 0.9 % IR SOLN
Status: DC | PRN
  Administered 2018-01-28: 3000 mL

## 2018-01-28 MED ORDER — HYDRALAZINE HCL 20 MG/ML IJ SOLN: 10.0000 mg | Freq: Once | INTRAMUSCULAR | Status: DC

## 2018-01-28 MED ORDER — LATANOPROST 0.005 % OP SOLN
1.0000 [drp] | Freq: Every day | OPHTHALMIC | Status: DC
Start: 2018-01-28 — End: 2018-02-02
  Administered 2018-01-29 – 2018-02-01 (×4): 1 [drp] via OPHTHALMIC
  Filled 2018-01-28: qty 2.5

## 2018-01-28 MED ORDER — TRANEXAMIC ACID 1000 MG/10ML IV SOLN
1000.0000 mg | INTRAVENOUS | Status: AC
  Administered 2018-01-28: 1000 mg via INTRAVENOUS
  Filled 2018-01-28: qty 10

## 2018-01-28 MED ORDER — MORPHINE SULFATE (PF) 2 MG/ML IV SOLN
0.5000 mg | INTRAVENOUS | Status: DC | PRN
  Administered 2018-01-28 (×2): 0.5 mg via INTRAVENOUS
  Administered 2018-01-28: 1 mg via INTRAVENOUS
  Administered 2018-01-28: 0.5 mg via INTRAVENOUS
  Filled 2018-01-28 (×4): qty 1

## 2018-01-28 MED ORDER — ONDANSETRON HCL 4 MG PO TABS: 4.0000 mg | ORAL_TABLET | Freq: Four times a day (QID) | ORAL | Status: DC | PRN

## 2018-01-28 MED ORDER — CEFAZOLIN SODIUM-DEXTROSE 2-4 GM/100ML-% IV SOLN
INTRAVENOUS | Status: AC
  Filled 2018-01-28: qty 100

## 2018-01-28 MED ORDER — ROCURONIUM BROMIDE 10 MG/ML (PF) SYRINGE
PREFILLED_SYRINGE | INTRAVENOUS | Status: AC
Start: 2018-01-28 — End: ?
  Filled 2018-01-28: qty 5

## 2018-01-28 MED ORDER — CEFAZOLIN SODIUM-DEXTROSE 2-4 GM/100ML-% IV SOLN
2.0000 g | INTRAVENOUS | Status: AC
  Administered 2018-01-28: 2 g via INTRAVENOUS
  Filled 2018-01-28: qty 100

## 2018-01-28 SURGICAL SUPPLY — 49 items
BAG DECANTER FOR FLEXI CONT (MISCELLANEOUS) ×3 IMPLANT
CAPT HIP HEMI 2 ×3 IMPLANT
CELLS DAT CNTRL 66122 CELL SVR (MISCELLANEOUS) ×1 IMPLANT
CLOSURE WOUND 1/2 X4 (GAUZE/BANDAGES/DRESSINGS) ×1
COVER SURGICAL LIGHT HANDLE (MISCELLANEOUS) ×3 IMPLANT
DRAPE C-ARM 42X72 X-RAY (DRAPES) ×3 IMPLANT
DRAPE POUCH INSTRU U-SHP 10X18 (DRAPES) ×3 IMPLANT
DRAPE STERI IOBAN 125X83 (DRAPES) ×3 IMPLANT
DRAPE U-SHAPE 47X51 STRL (DRAPES) ×6 IMPLANT
DRSG AQUACEL AG ADV 3.5X10 (GAUZE/BANDAGES/DRESSINGS) IMPLANT
DRSG MEPILEX BORDER 4X12 (GAUZE/BANDAGES/DRESSINGS) ×3 IMPLANT
DURAPREP 26ML APPLICATOR (WOUND CARE) ×6 IMPLANT
ELECT BLADE 4.0 EZ CLEAN MEGAD (MISCELLANEOUS) ×3
ELECT REM PT RETURN 9FT ADLT (ELECTROSURGICAL) ×3
ELECTRODE BLDE 4.0 EZ CLN MEGD (MISCELLANEOUS) ×1 IMPLANT
ELECTRODE REM PT RTRN 9FT ADLT (ELECTROSURGICAL) ×1 IMPLANT
GAUZE XEROFORM 1X8 LF (GAUZE/BANDAGES/DRESSINGS) ×3 IMPLANT
GLOVE BIOGEL PI IND STRL 7.0 (GLOVE) ×3 IMPLANT
GLOVE BIOGEL PI INDICATOR 7.0 (GLOVE) ×6
GLOVE ECLIPSE 7.0 STRL STRAW (GLOVE) ×3 IMPLANT
GLOVE SKINSENSE NS SZ7.5 (GLOVE) ×6
GLOVE SKINSENSE STRL SZ7.5 (GLOVE) ×3 IMPLANT
GLOVE SURG SS PI 7.0 STRL IVOR (GLOVE) ×6 IMPLANT
GLOVE SURG SYN 7.5  E (GLOVE) ×4
GLOVE SURG SYN 7.5 E (GLOVE) ×2 IMPLANT
GOWN SRG XL XLNG 56XLVL 4 (GOWN DISPOSABLE) ×1 IMPLANT
GOWN STRL NON-REIN XL XLG LVL4 (GOWN DISPOSABLE) ×2
GOWN STRL REUS W/ TWL LRG LVL3 (GOWN DISPOSABLE) ×2 IMPLANT
GOWN STRL REUS W/TWL LRG LVL3 (GOWN DISPOSABLE) ×4
HANDPIECE INTERPULSE COAX TIP (DISPOSABLE) ×2
HOOD PEEL AWAY FLYTE STAYCOOL (MISCELLANEOUS) ×6 IMPLANT
IV NS IRRIG 3000ML ARTHROMATIC (IV SOLUTION) ×3 IMPLANT
KIT BASIN OR (CUSTOM PROCEDURE TRAY) ×3 IMPLANT
MARKER SKIN DUAL TIP RULER LAB (MISCELLANEOUS) ×3 IMPLANT
PACK TOTAL JOINT (CUSTOM PROCEDURE TRAY) ×3 IMPLANT
PACK UNIVERSAL I (CUSTOM PROCEDURE TRAY) ×3 IMPLANT
RTRCTR WOUND ALEXIS 18CM MED (MISCELLANEOUS) ×3
SAW OSC TIP CART 19.5X105X1.3 (SAW) ×3 IMPLANT
SET HNDPC FAN SPRY TIP SCT (DISPOSABLE) ×1 IMPLANT
STAPLER VISISTAT 35W (STAPLE) IMPLANT
STRIP CLOSURE SKIN 1/2X4 (GAUZE/BANDAGES/DRESSINGS) ×2 IMPLANT
SUT ETHIBOND 2 V 37 (SUTURE) ×3 IMPLANT
SUT VIC AB 1 CT1 27 (SUTURE) ×2
SUT VIC AB 1 CT1 27XBRD ANBCTR (SUTURE) ×1 IMPLANT
SUT VIC AB 2-0 CT1 27 (SUTURE) ×2
SUT VIC AB 2-0 CT1 TAPERPNT 27 (SUTURE) ×1 IMPLANT
TOWEL OR 17X26 10 PK STRL BLUE (TOWEL DISPOSABLE) ×3 IMPLANT
TRAY CATH 16FR W/PLASTIC CATH (SET/KITS/TRAYS/PACK) IMPLANT
YANKAUER SUCT BULB TIP NO VENT (SUCTIONS) ×3 IMPLANT

## 2018-01-28 NOTE — Consult Note (Signed)
ORTHOPAEDIC CONSULTATION  REQUESTING PHYSICIAN: Marinda ElkFeliz Ortiz, Abraham, MD  Chief Complaint: Left femoral neck hip fracture  HPI: Donna Summers is a 84 y.o. female who presents with left hip fracture s/p mechanical fall PTA at her SNF.  She was recently discharged from hospital for esophagitis and discharged to [facility].  The patient endorses severe pain in the left hip, that does not radiate, grinding in quality, worse with any movement, better with immobilization.  Denies LOC/fever/chills/nausea/vomiting.  Walks with walker at baseline.  Denies LOC, neck pain, abd pain.  Past Medical History:  Diagnosis Date  . Constipation   . Constipation   . GERD (gastroesophageal reflux disease)   . Glaucoma   . History of carotid artery disease   . Hyperlipidemia   . Hypertension   . UTI (urinary tract infection)   . Vertigo    Past Surgical History:  Procedure Laterality Date  . ESOPHAGOGASTRODUODENOSCOPY (EGD) WITH PROPOFOL N/A 01/22/2018   Procedure: ESOPHAGOGASTRODUODENOSCOPY (EGD) WITH PROPOFOL;  Surgeon: Graylin ShiverGanem, Salem F, MD;  Location: [facility];  Service: Endoscopy;  Laterality: N/A;  . UNKNOWN SURGICAL HISTORY     Social History   Socioeconomic History  . Marital status: Widowed    Spouse name: Not on file  . Number of children: Not on file  . Years of education: Not on file  . Highest education level: Not on file  Occupational History  . Not on file  Social Needs  . Financial resource strain: Not on file  . Food insecurity:    Worry: Not on file    Inability: Not on file  . Transportation needs:    Medical: Not on file    Non-medical: Not on file  Tobacco Use  . Smoking status: Never Smoker  . Smokeless tobacco: Never Used  Substance and Sexual Activity  . Alcohol use: Not Currently    Frequency: Never  . Drug use: Never  . Sexual activity: Not Currently  Lifestyle  . Physical activity:    Days per week: Not on file    Minutes per session: Not on file    . Stress: Not on file  Relationships  . Social connections:    Talks on phone: Not on file    Gets together: Not on file    Attends religious service: Not on file    Active member of club or organization: Not on file    Attends meetings of clubs or organizations: Not on file    Relationship status: Not on file  Other Topics Concern  . Not on file  Social History Narrative  . Not on file   History reviewed. No pertinent family history. Allergies  Allergen Reactions  . Bactrim [Sulfamethoxazole-Trimethoprim] Other (See Comments)    unknown  . Levaquin [Levofloxacin] Other (See Comments)    unknown   Prior to Admission medications   Medication Sig Start Date End Date Taking? Authorizing Provider  acetaminophen (TYLENOL) 325 MG tablet Take 650 mg by mouth every 6 (six) hours as needed for mild pain or moderate pain.   Yes [provider]  brimonidine (ALPHAGAN) 0.15 % ophthalmic solution Place 1 drop into the left eye 3 (three) times daily.   Yes [provider]  Brimonidine-Dorzolamide 0.15-2 % SOLN Place 1 drop into the left eye 3 (three) times daily.   Yes [provider]  BRINZOLAMIDE OP Place 1 drop into the left eye 3 (three) times daily.   Yes [provider]  irbesartan Evlyn Kanner(AVAPRO)  75 MG tablet Take 37.5 mg by mouth at bedtime. TAKES 1/2 TABLET   Yes [provider]  meclizine (ANTIVERT) 12.5 MG tablet Take 12.5 mg by mouth 3 (three) times daily as needed for dizziness.   Yes [provider]  pantoprazole (PROTONIX) 40 MG tablet Take 1 tablet (40 mg total) by mouth 2 (two) times daily. 01/24/18  Yes Purohit, Salli Quarry, MD  polyethylene glycol (MIRALAX / GLYCOLAX) packet Take 17 g by mouth daily. 01/08/18  Yes Randel Pigg, Dorma Russell, MD  travoprost, benzalkonium, (TRAVATAN) 0.004 % ophthalmic solution Place 1 drop into the left eye at bedtime.   Yes [provider]   Dg Chest 2 View  Result Date: 01/27/2018 CLINICAL DATA:   Preop left femoral neck fracture. EXAM: CHEST - 2 VIEW COMPARISON:  01/13/2018 CXR FINDINGS: Mild cardiomegaly with moderate aortic atherosclerosis. No aneurysm. Central vascular congestion without acute pneumonic consolidation. Surgical anchors project over the left humeral head. IMPRESSION: Mild cardiomegaly without acute pulmonary disease. Aortic atherosclerosis. Electronically Signed   By: Tollie Eth M.D.   On: 01/27/2018 23:34   Ct Head Wo Contrast  Result Date: 01/27/2018 CLINICAL DATA:  Fall with hip injury EXAM: CT HEAD WITHOUT CONTRAST TECHNIQUE: Contiguous axial images were obtained from the base of the skull through the vertex without intravenous contrast. COMPARISON:  Radiograph 01/27/2018 FINDINGS: Brain: No acute territorial infarction, hemorrhage or intracranial mass is visualized. Moderate atrophy. Mild small vessel ischemic changes of the white matter. Ventricle size within normal limits Vascular: No hyperdense vessels.  Carotid vascular calcification. Skull: Normal. Negative for fracture or focal lesion. Sinuses/Orbits: Mild mucosal thickening in the maxillary and ethmoid sinuses. No acute orbital abnormality. Other: None. IMPRESSION: 1. No CT evidence for acute intracranial abnormality. 2. Atrophy with small vessel ischemic changes of the white matter Electronically Signed   By: Jasmine Pang M.D.   On: 01/27/2018 23:41   Dg Hip Unilat With Pelvis 2-3 Views Left  Result Date: 01/27/2018 CLINICAL DATA:  Fall with pain to the left hip EXAM: DG HIP (WITH OR WITHOUT PELVIS) 2-3V LEFT COMPARISON:  CT 01/13/2018 FINDINGS: Residual contrast material within the colon. Pubic symphysis and rami are intact. The right femoral head projects in joint. Acute left femoral neck fracture with superior displacement of the trochanter. Left femoral head projects in joint. IMPRESSION: Acute displaced left femoral neck fracture Electronically Signed   By: Jasmine Pang M.D.   On: 01/27/2018 23:31    All  pertinent xrays, MRI, CT independently reviewed and interpreted  Positive ROS: All other systems have been reviewed and were otherwise negative with the exception of those mentioned in the HPI and as above.  Physical Exam: General: Alert, no acute distress Cardiovascular: No pedal edema Respiratory: No cyanosis, no use of accessory musculature GI: No organomegaly, abdomen is soft and non-tender Skin: No lesions in the area of chief complaint Neurologic: Sensation intact distally Psychiatric: Patient is competent for consent with normal mood and affect Lymphatic: No axillary or cervical lymphadenopathy  MUSCULOSKELETAL:  - pain with movement of the hip and extremity - skin intact - NVI distally - compartments soft  Assessment: Left femoral neck hip fracture  Plan: - partial hip replacement is recommended, patient and family are aware of r/b/a and wish to proceed - consent obtained - medical optimization per primary team - surgery is planned for this afternoon  Thank you for the consult and the opportunity to see Ms. Charlson  N. Glee Arvin, MD [facility] [phone]  7:20 AM

## 2018-01-28 NOTE — Anesthesia Procedure Notes (Signed)
Procedure Name: Intubation Date/Time: 01/28/2018 4:34 PM Performed by: White, Amedeo Plenty, CRNA Pre-anesthesia Checklist: Patient identified, Emergency Drugs available, Suction available and Patient being monitored Patient Re-evaluated:Patient Re-evaluated prior to induction Oxygen Delivery Method: Circle System Utilized Preoxygenation: Pre-oxygenation with 100% oxygen Induction Type: IV induction Ventilation: Mask ventilation without difficulty Laryngoscope Size: Mac and 3 Grade View: Grade II Tube type: Oral Tube size: 7.0 mm Number of attempts: 1 Airway Equipment and Method: Stylet and Oral airway Placement Confirmation: ETT inserted through vocal cords under direct vision,  positive ETCO2 and breath sounds checked- equal and bilateral Secured at: 21 cm Tube secured with: Tape Dental Injury: Teeth and Oropharynx as per pre-operative assessment

## 2018-01-28 NOTE — ED Notes (Signed)
[facility] contacted [facility].

## 2018-01-28 NOTE — Op Note (Signed)
DIRECT ANTERIOR APPROACH HEMI HIP ARTHROPLASTY  Procedure Note Donna Summers L Sulkowski   [id number]  Pre-op Diagnosis: left  hip fracture     Post-op Diagnosis: same   Operative Procedures  1. Prosthetic replacement for femoral neck fracture. CPT [phone]  Personnel  Surgeon(s): Tarry KosXu, Cherysh Epperly M, MD  ASSIST: Oneal GroutMary Lindsey Stanbery, PA-C; necessary for the timely completion of procedure and due to complexity of procedure.   Anesthesia: general  Prosthesis: Depuy Femur: Corail KA 13 Head: 47 mm size: +1.5 Bearing Type: Bipolar  Hip Hemiarthroplasty (Anterior Approach) Op Note:  After informed consent was obtained and the operative extremity marked in the holding area, the patient was brought back to the operating room and placed supine on the HANA table. Next, the operative extremity was prepped and draped in normal sterile fashion. Surgical timeout occurred verifying patient identification, surgical site, surgical procedure and administration of antibiotics.  A modified anterior Smith-Peterson approach to the hip was performed, using the interval between tensor fascia lata and sartorius.  Dissection was carried bluntly down onto the anterior hip capsule. The lateral femoral circumflex vessels were identified and coagulated. A capsulotomy was performed and the capsular flaps tagged for later repair.  Fluoroscopy was utilized to prepare for the femoral neck cut. The neck osteotomy was performed. The femoral head was removed and found a 47 mm head was the appropriate fit.    We then turned our attention to the femur.  After placing the femoral hook, the leg was taken to externally rotated, extended and adducted position taking care to perform soft tissue releases to allow for adequate mobilization of the femur. Soft tissue was cleared from the shoulder of the greater trochanter and the hook elevator used to improve exposure of the proximal femur. Sequential broaching performed up to a size 13. Trial neck  and head were placed. The leg was brought back up to neutral and the construct reduced. The position and sizing of components, offset and leg lengths were checked using fluoroscopy. Stability of the construct was checked in extension and external rotation without any subluxation or impingement of prosthesis. We dislocated the prosthesis, dropped the leg back into position, removed trial components, and irrigated copiously. The final stem and head was then placed, the leg brought back up, the system reduced and fluoroscopy used to verify positioning.  We irrigated, obtained hemostasis and closed the capsule using #2 ethibond suture.  The fascia was closed with #1 vicryl plus, the deep fat layer was closed with 0 vicryl, the subcutaneous layers closed with 2.0 Vicryl Plus and the skin closed with staples. A sterile dressing was applied. The patient was awakened in the operating room and taken to recovery in stable condition. All sponge, needle, and instrument counts were correct at the end of the case.   Position: supine  Complications: none.  Time Out: performed   Drains/Packing: none  Estimated blood loss: 150 cc  Returned to Recovery Room: in good condition.   Antibiotics: yes   Mechanical VTE (DVT) Prophylaxis: sequential compression devices, TED thigh-high  Chemical VTE (DVT) Prophylaxis: lovenox  Fluid Replacement: Crystalloid: see anesthesia record  Specimens Removed: 1 to pathology   Sponge and Instrument Count Correct? yes   PACU: portable radiograph - low AP   Admission: inpatient status, start PT & OT POD#1  Plan/RTC: Return in 2 weeks for staple removal. Return in 6 weeks to see MD.  Weight Bearing/Load Lower Extremity: full  Hip precautions: none Suture Removal: 10-14 days  Betadine  to incision twice daily once dressing is removed on POD#7  N. Glee Arvin, MD [facility] [phone] 6:10 PM

## 2018-01-28 NOTE — Progress Notes (Signed)
Orthopedic Tech Progress Note Patient Details:  Donna Summers 30-Jul-1920 [id number]  Patient ID: Donna Summers, female   DOB: 30-Jul-1920, 84 y.o.   MRN: [id number] Pt cant have ohf due to age restrictions.  Trinna PostMartinez, Leelan Rajewski J 01/28/2018, 10:58 PM

## 2018-01-28 NOTE — H&P (Signed)
History and Physical    Donna Summers DOB: Feb 10, 1920 DOA: 01/27/2018  PCP: [name], MD   Patient coming from: SNF   Chief Complaint: Fall with severe left hip pain   HPI: Donna Summers is a 84 y.o. female with medical history significant for GERD with esophagitis, hypertension, chronic kidney disease stage III, now presenting from her nursing home for evaluation of severe left hip pain after a fall.  Patient reports that she was in her usual state of health and had an uneventful day when she was bending over to put her shoes away in a cupboard.  She slipped, fell to the ground without hitting her head or losing consciousness, but developed immediate and severe pain in the left hip.  She denies any headache, change in vision or hearing, or focal numbness or weakness.  Denies chest pain.  Denies fevers, chills, or shortness of breath.  Reports that she is usually able to ambulate up and down the hallways with her walker without experiencing any dyspnea or angina.  ED Course: Upon arrival to the ED, patient is found to be afebrile, saturating adequately on room air, hypertensive to 210/100, and vitals otherwise normal.  EKG features a sinus rhythm, chest x-ray is notable for mild cardiomegaly without acute process, and noncontrast head CT is negative for acute intracranial abnormality.  Chemistry panel is notable for a creatinine of 1.31, similar to priors.  CBC features a leukocytosis to 15,000.  Patient was treated with 50 mcg of fentanyl and orthopedic surgery was consulted by the ED physician.  Consultant recommends a medical admission to [facility].  Patient remains hypertensive, but otherwise stable and will be admitted to the medical-surgical unit at [facility] for ongoing evaluation and management of acute left hip fracture.  Review of Systems:  All other systems reviewed and apart from HPI, are negative.  Past Medical History:  Diagnosis Date  .  Constipation   . Constipation   . GERD (gastroesophageal reflux disease)   . Glaucoma   . History of carotid artery disease   . Hyperlipidemia   . Hypertension   . UTI (urinary tract infection)   . Vertigo     Past Surgical History:  Procedure Laterality Date  . ESOPHAGOGASTRODUODENOSCOPY (EGD) WITH PROPOFOL N/A 01/22/2018   Procedure: ESOPHAGOGASTRODUODENOSCOPY (EGD) WITH PROPOFOL;  Surgeon: Graylin Shiver, MD;  Location: [facility];  Service: Endoscopy;  Laterality: N/A;  . UNKNOWN SURGICAL HISTORY       reports that she has never smoked. She has never used smokeless tobacco. She reports that she drank alcohol. She reports that she does not use drugs.  Allergies  Allergen Reactions  . Bactrim [Sulfamethoxazole-Trimethoprim] Other (See Comments)    unknown  . Levaquin [Levofloxacin] Other (See Comments)    unknown    History reviewed. No pertinent family history.   Prior to Admission medications   Medication Sig Start Date End Date Taking? Authorizing Provider  acetaminophen (TYLENOL) 325 MG tablet Take 650 mg by mouth every 6 (six) hours as needed for mild pain or moderate pain.   Yes [provider]  brimonidine (ALPHAGAN) 0.15 % ophthalmic solution Place 1 drop into the left eye 3 (three) times daily.   Yes [provider]  Brimonidine-Dorzolamide 0.15-2 % SOLN Place 1 drop into the left eye 3 (three) times daily.   Yes [provider]  BRINZOLAMIDE OP Place 1 drop into the left eye 3 (three) times daily.  Yes [provider]  irbesartan (AVAPRO) 75 MG tablet Take 37.5 mg by mouth at bedtime. TAKES 1/2 TABLET   Yes [provider]  meclizine (ANTIVERT) 12.5 MG tablet Take 12.5 mg by mouth 3 (three) times daily as needed for dizziness.   Yes [provider]  pantoprazole (PROTONIX) 40 MG tablet Take 1 tablet (40 mg total) by mouth 2 (two) times daily. 01/24/18  Yes Purohit, Salli QuarryShrey C, MD  polyethylene glycol (MIRALAX /  GLYCOLAX) packet Take 17 g by mouth daily. 01/08/18  Yes Randel PiggSilva Zapata, Dorma RussellEdwin, MD  travoprost, benzalkonium, (TRAVATAN) 0.004 % ophthalmic solution Place 1 drop into the left eye at bedtime.   Yes [provider]    Physical Exam: Vitals:   01/27/18 2333 01/28/18 0005 01/28/18 0030 01/28/18 0100  BP:  (!) 202/77 (!) 209/97 (!) 208/79  Pulse:  72 80 83  Resp:  (!) 24 (!) 25 13  Temp:      TempSrc:      SpO2:  94% 93% 96%  Weight: 71.7 kg (158 lb)     Height: 5\' 3"  (1.6 m)         Constitutional: NAD, calm, in apparent discomfort Eyes: PERTLA, lids and conjunctivae normal ENMT: Mucous membranes are moist. Posterior pharynx clear of any exudate or lesions.   Neck: normal, supple, no masses, no thyromegaly Respiratory: clear to auscultation bilaterally, no wheezing, no crackles. Normal respiratory effort.   Cardiovascular: S1 & S2 heard, regular rate and rhythm. No significant JVD. Abdomen: No distension, no tenderness, soft. Bowel sounds normal.  Musculoskeletal: no clubbing / cyanosis. Left leg shortening and external rotation; left hip tender; NVI distally.  Skin: no significant rashes, lesions, ulcers. Warm, dry, well-perfused. Neurologic: CN 2-12 grossly intact. Sensation intact. Strength 5/5 in all 4 limbs.  Psychiatric:  Alert and oriented to person, place, and situation, but not month or year. Pleasant and cooperative.     Labs on Admission: I have personally reviewed following labs and imaging studies  CBC: Recent Labs  Lab 01/21/18 0436 01/28/18 0001  WBC 6.7 15.0*  NEUTROABS 3.5 11.7*  HGB 11.3* 12.7  HCT 36.4 39.1  MCV 91.9 88.5  PLT 231 248   Basic Metabolic Panel: Recent Labs  Lab 01/21/18 0436 01/28/18 0001  NA 139 140  K 3.9 3.5  CL 110 108  CO2 22 21*  GLUCOSE 74 127*  BUN 20 21*  CREATININE 1.04* 1.31*  CALCIUM 8.5* 9.2  MG 1.8  --    GFR: Estimated Creatinine Clearance: 24.4 mL/min (A) (by C-G formula based on SCr of 1.31 mg/dL  (H)). Liver Function Tests: Recent Labs  Lab 01/21/18 0436 01/28/18 0001  AST 14* 28  ALT 8* 18  ALKPHOS 55 72  BILITOT 0.5 0.7  PROT 5.3* 6.9  ALBUMIN 2.6* 3.4*   No results for input(s): LIPASE, AMYLASE in the last 168 hours. No results for input(s): AMMONIA in the last 168 hours. Coagulation Profile: No results for input(s): INR, PROTIME in the last 168 hours. Cardiac Enzymes: No results for input(s): CKTOTAL, CKMB, CKMBINDEX, TROPONINI in the last 168 hours. BNP (last 3 results) No results for input(s): PROBNP in the last 8760 hours. HbA1C: No results for input(s): HGBA1C in the last 72 hours. CBG: No results for input(s): GLUCAP in the last 168 hours. Lipid Profile: No results for input(s): CHOL, HDL, LDLCALC, TRIG, CHOLHDL, LDLDIRECT in the last 72 hours. Thyroid Function Tests: No results for input(s): TSH, T4TOTAL, FREET4, T3FREE, THYROIDAB  in the last 72 hours. Anemia Panel: No results for input(s): VITAMINB12, FOLATE, FERRITIN, TIBC, IRON, RETICCTPCT in the last 72 hours. Urine analysis:    Component Value Date/Time   COLORURINE AMBER (A) 01/19/2018 1846   APPEARANCEUR CLOUDY (A) 01/19/2018 1846   LABSPEC 1.019 01/19/2018 1846   PHURINE 5.0 01/19/2018 1846   GLUCOSEU NEGATIVE 01/19/2018 1846   HGBUR LARGE (A) 01/19/2018 1846   BILIRUBINUR NEGATIVE 01/19/2018 1846   KETONESUR 20 (A) 01/19/2018 1846   PROTEINUR NEGATIVE 01/19/2018 1846   NITRITE NEGATIVE 01/19/2018 1846   LEUKOCYTESUR LARGE (A) 01/19/2018 1846   Sepsis Labs: @LABRCNTIP (procalcitonin:4,lacticidven:4) ) Recent Results (from the past 240 hour(s))  Culture, Urine     Status: Abnormal   Collection Time: 01/19/18  6:46 PM  Result Value Ref Range Status   Specimen Description   Final    URINE, RANDOM Performed at [facility], [address], [place], [place] [address]    Special Requests   Final    NONE Performed at [facility], [address] [place], [place] [address]    Culture >=100,000 COLONIES/mL KLEBSIELLA PNEUMONIAE (A)  Final   Report Status 01/22/2018 FINAL  Final   Organism ID, Bacteria KLEBSIELLA PNEUMONIAE (A)  Final      Susceptibility   Klebsiella pneumoniae - MIC*    AMPICILLIN >=32 RESISTANT Resistant     CEFAZOLIN <=4 SENSITIVE Sensitive     CEFTRIAXONE <=1 SENSITIVE Sensitive     CIPROFLOXACIN <=0.25 SENSITIVE Sensitive     GENTAMICIN <=1 SENSITIVE Sensitive     IMIPENEM 0.5 SENSITIVE Sensitive     NITROFURANTOIN 64 INTERMEDIATE Intermediate     TRIMETH/SULFA <=20 SENSITIVE Sensitive     AMPICILLIN/SULBACTAM 4 SENSITIVE Sensitive     PIP/TAZO <=4 SENSITIVE Sensitive     Extended ESBL NEGATIVE Sensitive     * >=100,000 COLONIES/mL KLEBSIELLA PNEUMONIAE     Radiological Exams on Admission: Dg Chest 2 View  Result Date: 01/27/2018 CLINICAL DATA:  Preop left femoral neck fracture. EXAM: CHEST - 2 VIEW COMPARISON:  01/13/2018 CXR FINDINGS: Mild cardiomegaly with moderate aortic atherosclerosis. No aneurysm. Central vascular congestion without acute pneumonic consolidation. Surgical anchors project over the left humeral head. IMPRESSION: Mild cardiomegaly without acute pulmonary disease. Aortic atherosclerosis. Electronically Signed   By: Tollie Eth M.D.   On: 01/27/2018 23:34   Ct Head Wo Contrast  Result Date: 01/27/2018 CLINICAL DATA:  Fall with hip injury EXAM: CT HEAD WITHOUT CONTRAST TECHNIQUE: Contiguous axial images were obtained from the base of the skull through the vertex without intravenous contrast. COMPARISON:  Radiograph 01/27/2018 FINDINGS: Brain: No acute territorial infarction, hemorrhage or intracranial mass is visualized. Moderate atrophy. Mild small vessel ischemic changes of the white matter. Ventricle size within normal limits Vascular: No hyperdense vessels.  Carotid vascular calcification. Skull: Normal. Negative for fracture or focal lesion. Sinuses/Orbits: Mild mucosal thickening in the maxillary  and ethmoid sinuses. No acute orbital abnormality. Other: None. IMPRESSION: 1. No CT evidence for acute intracranial abnormality. 2. Atrophy with small vessel ischemic changes of the white matter Electronically Signed   By: Jasmine Pang M.D.   On: 01/27/2018 23:41   Dg Hip Unilat With Pelvis 2-3 Views Left  Result Date: 01/27/2018 CLINICAL DATA:  Fall with pain to the left hip EXAM: DG HIP (WITH OR WITHOUT PELVIS) 2-3V LEFT COMPARISON:  CT 01/13/2018 FINDINGS: Residual contrast material within the colon. Pubic symphysis and rami are intact. The right femoral head projects  in joint. Acute left femoral neck fracture with superior displacement of the trochanter. Left femoral head projects in joint. IMPRESSION: Acute displaced left femoral neck fracture Electronically Signed   By: Jasmine Pang M.D.   On: 01/27/2018 23:31    EKG: Independently reviewed. Sinus rhythm, significant artifact, will repeat.   Assessment/Plan   1. Left hip fracture  - Presents with severe left hip pain after a ground-level mechanical fall  - Radiographs reveal acute displaced left femoral neck fracture  - Orthopedic surgery consulting  - Based on the available data, Ms. Dentler presents an estimated risk probability of 2% for perioperative MI or cardiac arrest per Nolon Nations al  - Continue gentle IVF hydration, keep NPO, hold ARB and use prn hydralazine, continue prn analgesia    2. Hypertension; hypertensive urgency  - BP 210/100 in ED  - Managed at home with ARB, held on admission  - Use hydralazine IVP's prn for now    3. CKD stage III - SCr is 1.31 on admission, similar to priors - Renally-dose medications, avoid nephrotoxins   4. GERD - Esophagitis on EGD from earlier this month  - Continue BID PPI     DVT prophylaxis: SCD's  Code Status: DNR  Family Communication: Son updated at bedside Consults called: orthopedic surgery  Admission status: Inpatient    Briscoe Deutscher, MD [facility] Pager  [phone]  If 7PM-7AM, please contact night-coverage [email or web address] Password [email or web address]  01/28/2018, 2:08 AM

## 2018-01-28 NOTE — Anesthesia Preprocedure Evaluation (Signed)
Anesthesia Evaluation  Patient identified by MRN, date of birth, ID band Patient awake    Reviewed: Allergy & Precautions, H&P , Patient's Chart, lab work & pertinent test results, reviewed documented beta blocker date and time   Airway Mallampati: II  TM Distance: >3 FB Neck ROM: full    Dental no notable dental hx.    Pulmonary    Pulmonary exam normal breath sounds clear to auscultation       Cardiovascular hypertension,  Rhythm:regular Rate:Normal     Neuro/Psych    GI/Hepatic   Endo/Other    Renal/GU      Musculoskeletal   Abdominal   Peds  Hematology   Anesthesia Other Findings Advanced age HTN EKG sinus Normal Labs  Reproductive/Obstetrics                             Anesthesia Physical Anesthesia Plan  ASA: III  Anesthesia Plan: General   Post-op Pain Management:    Induction: Intravenous  PONV Risk Score and Plan:   Airway Management Planned: Oral ETT  Additional Equipment:   Intra-op Plan:   Post-operative Plan: Extubation in OR  Informed Consent: I have reviewed the patients History and Physical, chart, labs and discussed the procedure including the risks, benefits and alternatives for the proposed anesthesia with the patient or authorized representative who has indicated his/her understanding and acceptance.   Dental Advisory Given  Plan Discussed with: CRNA and Surgeon  Anesthesia Plan Comments: (  )        Anesthesia Quick Evaluation

## 2018-01-28 NOTE — Transfer of Care (Signed)
Immediate Anesthesia Transfer of Care Note  Patient: Donna Summers  Procedure(s) Performed: DIRECT ANTERIOR APPROACH HEMI HIP ARTHROPLASTY (Left )  Patient Location: PACU  Anesthesia Type:General  Level of Consciousness: awake, alert , patient cooperative and responds to stimulation  Airway & Oxygen Therapy: Patient Spontanous Breathing and Patient connected to nasal cannula oxygen  Post-op Assessment: Report given to RN and Post -op Vital signs reviewed and stable  Post vital signs: Reviewed and stable  Last Vitals:  Vitals Value Taken Time  BP 195/81 01/28/2018  6:38 PM  Temp    Pulse 94 01/28/2018  6:41 PM  Resp 16 01/28/2018  6:41 PM  SpO2 98 % 01/28/2018  6:41 PM  Vitals shown include unvalidated device data.  Last Pain:  Vitals:   01/28/18 1353  TempSrc:   PainSc: 4       Patients Stated Pain Goal: 4 (01/28/18 0358)  Complications: No apparent anesthesia complications

## 2018-01-28 NOTE — ED Notes (Signed)
Family has ALL pt belongings.

## 2018-01-28 NOTE — Discharge Instructions (Signed)
° ° °  1. Change dressings as needed °2. May shower but keep incisions covered and dry °3. Take lovenox to prevent blood clots °4. Take stool softeners as needed °5. Take pain meds as needed ° °

## 2018-01-29 LAB — URINALYSIS, ROUTINE W REFLEX MICROSCOPIC
Bilirubin Urine: NEGATIVE
GLUCOSE, UA: NEGATIVE mg/dL
HGB URINE DIPSTICK: NEGATIVE
Ketones, ur: 5 mg/dL — AB
Nitrite: NEGATIVE
PROTEIN: NEGATIVE mg/dL
Specific Gravity, Urine: 1.026 (ref 1.005–1.030)
pH: 5 (ref 5.0–8.0)

## 2018-01-29 LAB — CBC
HEMATOCRIT: 33.9 % — AB (ref 36.0–46.0)
HEMOGLOBIN: 10.6 g/dL — AB (ref 12.0–15.0)
MCH: 27.9 pg (ref 26.0–34.0)
MCHC: 31.3 g/dL (ref 30.0–36.0)
MCV: 89.2 fL (ref 78.0–100.0)
Platelets: 179 10*3/uL (ref 150–400)
RBC: 3.8 MIL/uL — AB (ref 3.87–5.11)
RDW: 16.6 % — ABNORMAL HIGH (ref 11.5–15.5)
WBC: 10.4 10*3/uL (ref 4.0–10.5)

## 2018-01-29 LAB — BASIC METABOLIC PANEL
ANION GAP: 8 (ref 5–15)
BUN: 15 mg/dL (ref 6–20)
CHLORIDE: 108 mmol/L (ref 101–111)
CO2: 21 mmol/L — AB (ref 22–32)
Calcium: 8.2 mg/dL — ABNORMAL LOW (ref 8.9–10.3)
Creatinine, Ser: 1.1 mg/dL — ABNORMAL HIGH (ref 0.44–1.00)
GFR calc Af Amer: 48 mL/min — ABNORMAL LOW (ref >60)
GFR calc non Af Amer: 41 mL/min — ABNORMAL LOW (ref >60)
Glucose, Bld: 124 mg/dL — ABNORMAL HIGH (ref 65–99)
POTASSIUM: 4.3 mmol/L (ref 3.5–5.1)
Sodium: 137 mmol/L (ref 135–145)

## 2018-01-29 MED ORDER — PANTOPRAZOLE SODIUM 40 MG PO TBEC
40.0000 mg | DELAYED_RELEASE_TABLET | Freq: Two times a day (BID) | ORAL | Status: DC
  Administered 2018-01-29 – 2018-02-02 (×8): 40 mg via ORAL
  Filled 2018-01-29 (×8): qty 1

## 2018-01-29 NOTE — Anesthesia Postprocedure Evaluation (Signed)
Anesthesia Post Note  Patient: Donna Summers  Procedure(s) Performed: DIRECT ANTERIOR APPROACH HEMI HIP ARTHROPLASTY (Left )     Patient location during evaluation: PACU Anesthesia Type: General Level of consciousness: awake and alert Pain management: pain level controlled Vital Signs Assessment: post-procedure vital signs reviewed and stable Respiratory status: spontaneous breathing, nonlabored ventilation, respiratory function stable and patient connected to nasal cannula oxygen Cardiovascular status: blood pressure returned to baseline and stable Postop Assessment: no apparent nausea or vomiting Anesthetic complications: no    Last Vitals:  Vitals:   01/29/18 0023 01/29/18 0430  BP: 137/62 129/80  Pulse: 69 68  Resp: 12 16  Temp: 36.7 C 36.6 C  SpO2: 93% 97%    Last Pain:  Vitals:   01/29/18 0430  TempSrc: Axillary  PainSc:                  Atharv Barriere,W. EDMOND

## 2018-01-29 NOTE — Evaluation (Signed)
Physical Therapy Evaluation Patient Details Name: Donna Summers MRN: [id number] DOB: [birth year] Today's Date: 01/29/2018   History of Present Illness  Donna Summers is a 84 y.o. female who presents with left hip fracture s/p mechanical fall PTA at her SNF.  She was recently discharged from hospital for esophagitis and discharged to [facility].  The patient endorses severe pain in the left hip, that does not radiate, grinding in quality, worse with any movement, better with immobilization.  Denies LOC/fever/chills/nausea/vomiting.  Walks with walker at baseline.  Denies LOC, neck pain, abd pain. now s/p L hip hemiarthoplasty. 01/28/18    Clinical Impression  Patient is s/p above surgery resulting in functional limitations due to the deficits listed below (see PT Problem List). PTA, pt recovering at SNF, able to ambulate short distances with RW. Upon eval pt presents with post op pain and weakness, currently mod A x2 for OOB transfers into bedside chair and unable to ambulate this visit.  Patient will benefit from skilled PT to increase their independence and safety with mobility to allow discharge to the venue listed below.       Follow Up Recommendations SNF;Supervision/Assistance - 24 hour    Equipment Recommendations  None recommended by PT    Recommendations for Other Services       Precautions / Restrictions Precautions Precautions: Fall Precaution Comments: Anterior approach. No hip precautions per MD note on 01/28/18. Restrictions Weight Bearing Restrictions: Yes LLE Weight Bearing: Weight bearing as tolerated      Mobility  Bed Mobility Overal bed mobility: Needs Assistance Bed Mobility: Supine to Sit     Supine to sit: Mod assist        Transfers Overall transfer level: Needs assistance Equipment used: Rolling walker (2 wheeled) Transfers: Sit to/from [employer]Stand;Stand Pivot Transfers Sit to Stand: Mod assist;+2 physical assistance Stand pivot transfers: Mod  assist;+2 physical assistance       General transfer comment: mod A x2 to power up and stabilize in walker, posterior lean and poor balance due to pain. SPT into bedside chair   Ambulation/Gait                Stairs            Wheelchair Mobility    Modified Rankin (Stroke Patients Only)       Balance Overall balance assessment: Needs assistance   Sitting balance-Leahy Scale: Good       Standing balance-Leahy Scale: Poor                               Pertinent Vitals/Pain Pain Assessment: Faces Faces Pain Scale: Hurts even more Pain Location: L hip Pain Descriptors / Indicators: Discomfort;Grimacing Pain Intervention(s): Limited activity within patient's tolerance;Monitored during session;Premedicated before session;Repositioned    Home Living Family/patient expects to be discharged to:: Skilled nursing facility                 Additional Comments: Was at ALF    Prior Function Level of Independence: Needs assistance   Gait / Transfers Assistance Needed: household ambulation with RW     Comments: lives in ALF, IND ADLS, amb with RW at baseline--pt admittedly has been less active recently (family confirms)     Hand Dominance        Extremity/Trunk Assessment   Upper Extremity Assessment Upper Extremity Assessment: Defer to OT evaluation    Lower Extremity Assessment Lower Extremity Assessment: Generalized  weakness;RLE deficits/detail(RLE 4-/5, LLE post op pain. )       Communication      Cognition Arousal/Alertness: Awake/alert Behavior During Therapy: WFL for tasks assessed/performed Overall Cognitive Status: Within Functional Limits for tasks assessed Area of Impairment: Memory                     Memory: Decreased short-term memory         General Comments: improved      General Comments      Exercises General Exercises - Lower Extremity Ankle Circles/Pumps: 20 reps   Assessment/Plan    PT  Assessment Patient needs continued PT services  PT Problem List Decreased strength;Decreased activity tolerance;Decreased mobility;Decreased safety awareness;Decreased balance       PT Treatment Interventions DME instruction;Gait training;Functional mobility training;Therapeutic activities;Therapeutic exercise;Patient/family education    PT Goals (Current goals can be found in the Care Plan section)  Acute Rehab PT Goals Patient Stated Goal: SNF PT Goal Formulation: With patient/family Time For Goal Achievement: 02/04/18 Potential to Achieve Goals: Good    Frequency Min 3X/week   Barriers to discharge        Co-evaluation PT/OT/SLP Co-Evaluation/Treatment: Yes Reason for Co-Treatment: For patient/therapist safety;To address functional/ADL transfers PT goals addressed during session: Mobility/safety with mobility;Balance;Proper use of DME;Strengthening/ROM         AM-PAC PT "6 Clicks" Daily Activity  Outcome Measure Difficulty turning over in bed (including adjusting bedclothes, sheets and blankets)?: Unable Difficulty moving from lying on back to sitting on the side of the bed? : Unable Difficulty sitting down on and standing up from a chair with arms (e.g., wheelchair, bedside commode, etc,.)?: Unable Help needed moving to and from a bed to chair (including a wheelchair)?: A Lot Help needed walking in hospital room?: A Lot Help needed climbing 3-5 steps with a railing? : Total 6 Click Score: 8    End of Session Equipment Utilized During Treatment: Gait belt Activity Tolerance: Patient tolerated treatment well Patient left: with call bell/phone within reach;with family/visitor present;in chair Nurse Communication: Mobility status PT Visit Diagnosis: Unsteadiness on feet (R26.81)    Time: 8295-6213 PT Time Calculation (min) (ACUTE ONLY): 33 min   Charges:   PT Evaluation $PT Eval Low Complexity: 1 Low     PT G Codes:       Etta Grandchild, PT, DPT Acute Rehab  Services Pager: [phone]    Etta Grandchild 01/29/2018, 2:02 PM

## 2018-01-29 NOTE — Clinical Social Work Note (Signed)
Clinical Social Work Assessment  Patient Details  Name: Donna Summers MRN: [id number] Date of Birth: Oct 02, 1920  Date of referral:  01/29/18               Reason for consult:                   Permission sought to share information with:  Family Supports Permission granted to share information::  Yes, Verbal Permission Granted  Name::     Weston Brassick  Agency::     Relationship::  Son  SolicitorContact Information:     Housing/Transportation Living arrangements for the past 2 months:  Assisted Living Facility Source of Information:  Adult Children Patient Interpreter Needed:  None Criminal Activity/Legal Involvement Pertinent to Current Situation/Hospitalization:  No - Comment as needed Significant Relationships:  Adult Children Lives with:  Self Do you feel safe going back to the place where you live?    Need for family participation in patient care:  No (Coment)  Care giving concerns:  Pt is alert and oriented x3. Pt directed social worker to speak with son. Pt was [facility].   Social Worker assessment / plan:  CSW spoke with pt's son. [facility]. Pt's son was agreeable to CSW sending [facility] referral as the pt and pt's family was impressed with that place. Pt's son also suggest CSW send referral [facility] due to convenient location in reference to the pt's other son's address.  Employment status:  Retired Health and safety inspectornsurance information:  Medicare PT Recommendations:  Skilled Nursing Facility Information / Referral to community resources:  Skilled Nursing Facility  Patient/Family's Response to care:  Pt's son verbalized understanding of CSW role and expressed appreciation for support. Pt's son denies any concern regarding pt care at this time.   Patient/Family's Understanding of and Emotional Response to Diagnosis, Current Treatment, and Prognosis:  Pt's son understanding and realistic regarding pt's physical limitations. Pt's son understands the  need for pt to go to SNF at d/c--Pt's son agreeable. Pt's son denies any concern regarding pt's treatment plan at this time. CSW will continue to provide support and facilitate d/c needs.   Emotional Assessment Appearance:  Appears stated age Attitude/Demeanor/Rapport:  (Patient was appropriate) Affect (typically observed):  Accepting, Appropriate, Calm Orientation:  Oriented to Situation, Oriented to Self, Oriented to Place Alcohol / Substance use:  Not Applicable Psych involvement (Current and /or in the community):  No (Comment)  Discharge Needs  Concerns to be addressed:  Basic Needs, Care Coordination Readmission within the last 30 days:  Yes Current discharge risk:  Dependent with Mobility Barriers to Discharge:  Continued Medical Work up   Pacific MutualBridget A Natavia Sublette, LCSW 01/29/2018, 4:34 PM

## 2018-01-29 NOTE — Evaluation (Signed)
Occupational Therapy Evaluation Patient Details Name: Donna Summers MRN: [id number] DOB: Feb 19, 1920 Today's Date: 01/29/2018    History of Present Illness Donna Summers is a 84 y.o. female who presents with left hip fracture s/p mechanical fall PTA at her SNF.  She was recently discharged from hospital for esophagitis and discharged to [facility].  The patient endorses severe pain in the left hip, that does not radiate, grinding in quality, worse with any movement, better with immobilization.  Denies LOC/fever/chills/nausea/vomiting.  Walks with walker at baseline.  Denies LOC, neck pain, abd pain. now s/p L hip hemiarthoplasty.   Clinical Impression   PTA, pt was living at ALF and was receiving assistance for BADLs and using RW for functional mobility. Pt currently requiring Max A for LB ADLs and Mod A +2 for functional mobility. Pt limited by pain in LLE and fear of falling. Pt willing to participate in therapy. Pt would benefit from further acute OT to facilitate safe dc. Recommend dc to SNF for further OT to optimize safety, independence with ADLs, and return to PLOF.      Follow Up Recommendations  SNF    Equipment Recommendations  None recommended by OT    Recommendations for Other Services PT consult     Precautions / Restrictions Precautions Precautions: Fall Precaution Comments: Anterior approach. No hip precautions per MD note on 01/28/18. Restrictions Weight Bearing Restrictions: Yes LLE Weight Bearing: Weight bearing as tolerated      Mobility Bed Mobility Overal bed mobility: Needs Assistance Bed Mobility: Supine to Sit     Supine to sit: Mod assist     General bed mobility comments: Mod A to bring LLE towards EOB, transition hips with bed pad, and elevate trunk  Transfers Overall transfer level: Needs assistance Equipment used: Rolling walker (2 wheeled) Transfers: Sit to/from [employer] Sit to Stand: Mod assist;+2 physical  assistance Stand pivot transfers: Mod assist;+2 physical assistance       General transfer comment: mod A x2 to power up and stabilize in walker, posterior lean and poor balance due to pain. SPT into bedside chair     Balance Overall balance assessment: Needs assistance Sitting-balance support: No upper extremity supported;Feet supported Sitting balance-Leahy Scale: Fair     Standing balance support: During functional activity;Bilateral upper extremity supported Standing balance-Leahy Scale: Poor Standing balance comment: reliant on UEs static an dynamic                           ADL either performed or assessed with clinical judgement   ADL Overall ADL's : Needs assistance/impaired Eating/Feeding: Set up;Sitting   Grooming: Set up;Sitting   Upper Body Bathing: Set up;Supervision/ safety;Sitting   Lower Body Bathing: Minimal assistance;Sit to/from stand;Cueing for safety;Cueing for sequencing   Upper Body Dressing : Set up;Supervision/safety;Sitting   Lower Body Dressing: Maximal assistance;+2 for safety/equipment;Sit to/from stand   Toilet Transfer: Moderate assistance;+2 for safety/equipment;Stand-pivot;RW(Simulated to recliner)             General ADL Comments: Pt requiring Max A for LB ADLs and Mod A +2 for functional transfers. Limited by pain in LLE and fear of falling     Vision Patient Visual Report: No change from baseline       Perception     Praxis      Pertinent Vitals/Pain Pain Assessment: Faces Faces Pain Scale: Hurts even more Pain Location: L hip Pain Descriptors / Indicators: Discomfort;Grimacing Pain Intervention(s):  Monitored during session;Limited activity within patient's tolerance;Repositioned     Hand Dominance Right   Extremity/Trunk Assessment Upper Extremity Assessment Upper Extremity Assessment: Generalized weakness   Lower Extremity Assessment Lower Extremity Assessment: Defer to PT evaluation;RLE  deficits/detail RLE Deficits / Details: RLE 4-/5, LLE post op pain.        Communication Communication Communication: No difficulties   Cognition Arousal/Alertness: Awake/alert Behavior During Therapy: WFL for tasks assessed/performed Overall Cognitive Status: Within Functional Limits for tasks assessed Area of Impairment: Memory                     Memory: Decreased short-term memory         General Comments: improved   General Comments  Daughter present throughout session    Exercises Exercises: General Lower Extremity General Exercises - Lower Extremity Ankle Circles/Pumps: 20 reps   Shoulder Instructions      Home Living Family/patient expects to be discharged to:: Skilled nursing facility   Available Help at Discharge: Family Type of Home: House Home Access: Stairs to enter Entrance Stairs-Number of Steps: 5   Home Layout: Two level;Able to live on main level with bedroom/bathroom               Home Equipment: Dan Humphreys - 2 wheels   Additional Comments: Was at ALF      Prior Functioning/Environment Level of Independence: Needs assistance  Gait / Transfers Assistance Needed: household ambulation with RW ADL's / Homemaking Assistance Needed: Performed BADLs with supervision from staff at ALF   Comments: live at ALF        OT Problem List: Decreased strength;Impaired balance (sitting and/or standing);Decreased knowledge of use of DME or AE      OT Treatment/Interventions: Self-care/ADL training;Therapeutic exercise;Energy conservation;DME and/or AE instruction;Therapeutic activities;Patient/family education    OT Goals(Current goals can be found in the care plan section) Acute Rehab OT Goals Patient Stated Goal: Go to rehab OT Goal Formulation: With patient/family Time For Goal Achievement: 02/12/18 Potential to Achieve Goals: Good ADL Goals Pt Will Perform Upper Body Dressing: with set-up;with supervision;sitting Pt Will Perform Lower  Body Dressing: with min guard assist;sit to/from stand Pt Will Transfer to Toilet: with min guard assist;bedside commode;stand pivot transfer Pt Will Perform Toileting - Clothing Manipulation and hygiene: with min guard assist;sit to/from stand Additional ADL Goal #1: Pt will perform bed mobility with supervision in preparation for ADLs  OT Frequency: Min 2X/week   Barriers to D/C:            Co-evaluation   Reason for Co-Treatment: For patient/therapist safety;To address functional/ADL transfers PT goals addressed during session: Mobility/safety with mobility;Balance;Proper use of DME;Strengthening/ROM        AM-PAC PT "6 Clicks" Daily Activity     Outcome Measure Help from another person eating meals?: None Help from another person taking care of personal grooming?: A Little Help from another person toileting, which includes using toliet, bedpan, or urinal?: A Lot Help from another person bathing (including washing, rinsing, drying)?: A Lot Help from another person to put on and taking off regular upper body clothing?: A Little Help from another person to put on and taking off regular lower body clothing?: A Lot 6 Click Score: 16   End of Session Equipment Utilized During Treatment: Rolling walker;Gait belt Nurse Communication: Mobility status  Activity Tolerance: Patient tolerated treatment well;Patient limited by pain Patient left: in chair;with chair alarm set;with family/visitor present  OT Visit Diagnosis: Unsteadiness on feet (R26.81);History of  falling (Z91.81);Muscle weakness (generalized) (M62.81)                Time: 7829-56211227-1243 OT Time Calculation (min): 16 min Charges:  OT General Charges $OT Visit: 1 Visit OT Evaluation $OT Eval Moderate Complexity: 1 Mod G-Codes:     Dana Dorner MSOT, OTR/L Acute Rehab Pager: [phone] Office: [phone]  Theodoro GristCharis M Africa Masaki 01/29/2018, 4:27 PM

## 2018-01-29 NOTE — Progress Notes (Signed)
Patient ID: Donna Summers, female   DOB: 04-27-1920, 84 y.o.   MRN: [id number] Patient is postoperative day 1 left hip hemiarthroplasty.  Plan for discharge to skilled nursing.  She has no complaints this morning.

## 2018-01-29 NOTE — Progress Notes (Signed)
Nutrition Brief Note  C/S received for Hip Fracture Protocol.   Wt Readings from Last 15 Encounters:  01/28/18 158 lb (71.7 kg)  01/24/18 158 lb 4.6 oz (71.8 kg)  01/19/18 150 lb (68 kg)  01/13/18 150 lb (68 kg)  01/08/18 149 lb (67.6 kg)    Body mass index is 27.99 kg/m.   Current diet order is Regular, patient is consuming approximately 50-100% of meals at this time. Pt reports good appetite currently and PTA. In fact, pt has gained weight recently. Labs and medications reviewed.   Discussed use of nutritional supplements such as Ensure/Boost if appetite poor in the future; pt is agreeable to this plan and reports she will let some one know if she wants one. Pt declines Ensure/Boost supplement at this time  No nutrition interventions warranted at this time. If nutrition issues arise, please consult RD.   Romelle Starcherate Vanya Carberry MS, RD, LDN, CNSC [phone] Pager  [phone] Weekend/On-Call Pager

## 2018-01-29 NOTE — [place] FL2 (Signed)
[place] MEDICAID FL2 LEVEL OF CARE SCREENING TOOL     IDENTIFICATION  Patient Name: Donna Summers Birthdate: August 11, 1920 Sex: female Admission Date (Current Location): 01/27/2018  [facility] and [place] Number:  Producer, television/film/video and Address:  [facility] [place], [address], [place], [place] [address]      Provider Number: [id number]  Attending Physician Name and Address:  Marinda Elk, MD  Relative Name and Phone Number:       Current Level of Care: Hospital Recommended Level of Care: Skilled Nursing Facility Prior Approval Number:    Date Approved/Denied:   PASRR Number: [id number]  Discharge Plan: SNF    Current Diagnoses: Patient Active Problem List   Diagnosis Date Noted  . Closed left hip fracture, initial encounter (HCC) 01/28/2018  . CKD (chronic kidney disease), stage III (HCC) 01/28/2018  . Hypertensive urgency   . Dysphagia 01/20/2018  . Acute lower UTI 01/20/2018  . Dehydration 01/20/2018  . Weakness generalized 01/20/2018  . Symptomatic cholelithiasis 01/19/2018  . Cholelithiasis 01/19/2018  . Cholecystitis 01/06/2018  . Constipation 01/06/2018  . History of urinary retention 01/06/2018  . Abdominal pain 01/06/2018  . Vitamin D deficiency 01/06/2018  . HLD (hyperlipidemia) 01/06/2018  . GERD (gastroesophageal reflux disease) 01/06/2018  . HTN (hypertension) 01/06/2018    Orientation RESPIRATION BLADDER Height & Weight     Self, Situation, Place  O2(Nasal Cannula 2L) External catheter(Placed 4/13) Weight: 158 lb (71.7 kg) Height:  5\' 3"  (160 cm)  BEHAVIORAL SYMPTOMS/MOOD NEUROLOGICAL BOWEL NUTRITION STATUS      Incontinent Diet(Regular diet, thin liquids)  AMBULATORY STATUS COMMUNICATION OF NEEDS Skin   Extensive Assist Verbally Surgical wounds(Left hip, silicone dressing)                       Personal Care Assistance Level of Assistance  Bathing, Feeding, Dressing Bathing Assistance: Maximum  assistance Feeding assistance: Independent Dressing Assistance: Maximum assistance     Functional Limitations Info  Sight, Hearing, Speech Sight Info: Adequate Hearing Info: Adequate Speech Info: Adequate    SPECIAL CARE FACTORS FREQUENCY  PT (By licensed PT), OT (By licensed OT)     PT Frequency: 3x OT Frequency: 3x            Contractures Contractures Info: Not present    Additional Factors Info  Code Status, Allergies Code Status Info: DNR Allergies Info: Bactrim Sulfamethoxazole-trimethoprim, Levaquin Levofloxacin           Current Medications (01/29/2018):  This is the current hospital active medication list Current Facility-Administered Medications  Medication Dose Route Frequency Provider Last Rate Last Dose  . acetaminophen (TYLENOL) tablet 325-650 mg  325-650 mg Oral Q6H PRN Tarry Kos, MD      . albuterol (PROVENTIL) (2.5 MG/3ML) 0.083% nebulizer solution 2.5 mg  2.5 mg Nebulization Q4H PRN Tarry Kos, MD      . alum & mag hydroxide-simeth (MAALOX/MYLANTA) 200-200-20 MG/5ML suspension 30 mL  30 mL Oral Q4H PRN Tarry Kos, MD      . brimonidine (ALPHAGAN) 0.15 % ophthalmic solution 1 drop  1 drop Left Eye TID Scarlett Presto, RPH   1 drop at 01/29/18 1047   And  . dorzolamide (TRUSOPT) 2 % ophthalmic solution 1 drop  1 drop Left Eye TID Scarlett Presto, RPH   1 drop at 01/29/18 1052  . docusate sodium (COLACE) capsule 100 mg  100 mg Oral BID Tarry Kos, MD  100 mg at 01/29/18 1051  . enoxaparin (LOVENOX) injection 30 mg  30 mg Subcutaneous Q24H Tarry KosXu, Naiping M, MD      . hydrALAZINE (APRESOLINE) injection 10 mg  10 mg Intravenous Once Tarry KosXu, Naiping M, MD      . hydrALAZINE (APRESOLINE) injection 15 mg  15 mg Intravenous Q4H PRN Tarry KosXu, Naiping M, MD      . HYDROcodone-acetaminophen (NORCO) 7.5-325 MG per tablet 1-2 tablet  1-2 tablet Oral Q4H PRN Tarry KosXu, Naiping M, MD      . HYDROcodone-acetaminophen (NORCO/VICODIN) 5-325 MG per tablet 1-2 tablet  1-2 tablet  Oral Q4H PRN Tarry KosXu, Naiping M, MD      . ketorolac (TORADOL) 15 MG/ML injection 7.5 mg  7.5 mg Intravenous Q6H Tarry KosXu, Naiping M, MD   7.5 mg at 01/29/18 1127  . lactated ringers infusion   Intravenous Continuous Tarry KosXu, Naiping M, MD 10 mL/hr at 01/28/18 1459    . latanoprost (XALATAN) 0.005 % ophthalmic solution 1 drop  1 drop Left Eye QHS Tarry KosXu, Naiping M, MD      . menthol-cetylpyridinium (CEPACOL) lozenge 3 mg  1 lozenge Oral PRN Tarry KosXu, Naiping M, MD       Or  . phenol (CHLORASEPTIC) mouth spray 1 spray  1 spray Mouth/Throat PRN Tarry KosXu, Naiping M, MD      . methocarbamol (ROBAXIN) tablet 500 mg  500 mg Oral Q6H PRN Tarry KosXu, Naiping M, MD       Or  . methocarbamol (ROBAXIN) 500 mg in dextrose 5 % 50 mL IVPB  500 mg Intravenous Q6H PRN Tarry KosXu, Naiping M, MD   Stopped at 01/28/18 0427  . metoCLOPramide (REGLAN) tablet 5-10 mg  5-10 mg Oral Q8H PRN Tarry KosXu, Naiping M, MD       Or  . metoCLOPramide (REGLAN) injection 5-10 mg  5-10 mg Intravenous Q8H PRN Tarry KosXu, Naiping M, MD      . morphine 2 MG/ML injection 0.5-1 mg  0.5-1 mg Intravenous Q2H PRN Tarry KosXu, Naiping M, MD      . ondansetron [facility](ZOFRAN) tablet 4 mg  4 mg Oral Q6H PRN Tarry KosXu, Naiping M, MD       Or  . ondansetron [facility](ZOFRAN) injection 4 mg  4 mg Intravenous Q6H PRN Tarry KosXu, Naiping M, MD      . pantoprazole (PROTONIX) EC tablet 40 mg  40 mg Oral BID AC Marinda ElkFeliz Ortiz, Abraham, MD      . polyethylene glycol (MIRALAX / GLYCOLAX) packet 17 g  17 g Oral Daily Tarry KosXu, Naiping M, MD   17 g at 01/29/18 1048  . tranexamic acid (CYKLOKAPRON) 2,000 mg in sodium chloride 0.9 % 50 mL Topical Application  2,000 mg Topical Once Tarry KosXu, Naiping M, MD         Discharge Medications: Please see discharge summary for a list of discharge medications.  Relevant Imaging Results:  Relevant Lab Results:   Additional Information SSN; [id number]  Maree KrabbeBridget A Antero Derosia, LCSW

## 2018-01-29 NOTE — Progress Notes (Signed)
PROGRESS NOTE    Donna Summers  ZOX:[id number] DOB: 03/28/1920 DOA: 01/27/2018 PCP: [name], MD      Brief Narrative:  Donna Summers is a 84 y.o. F with HTN who presents with hip pain after a fall.  Found to have left femoral neck fracture.   Assessment & Plan:  Left hip fracture -Hydrocodone-acetaminophen as needed for pain, ketorolac scheduled -Apply ice, document sedation and vitals per Hip fracture protocol -MIVF, stop when taking PO -Nutrition/PT consulted -Hold following meds:  -losartan, resume when ketorolac stopped -Reasonable to consider osteoporosis screening after discharge if not done previously -Recommend vitamin D 800 IUand calcium 1200 mg supplementation after discharge   Hypertension Hypertensive urgency Urgency resolved -Hold losartan -Control pain -COntineu PRN hydralazine  CKD stage III Baseline creatinine 1.1  Acute blood loss anemia Expected drop after surgery    DVT prophylaxis: Lovenox, SCDs Code Status: DO NOT RESUSCITATE Family Communication: None present MDM and disposition Plan: The below labs and imaging reports were reviewed.  The patient's status is clinically improving.     Consultants:   Orthopedics  Procedures:   LEFT hemiarthroplasty 4/12    Subjective: Feels well.  Got up twice today with PT.  Leg/hip pain well controlled.  No confusion, seizure.  No fever, cough, dyspnea, chest pain.    Objective: Vitals:   01/28/18 2025 01/28/18 2119 01/29/18 0023 01/29/18 0430  BP: (!) 158/65 (!) 145/64 137/62 129/80  Pulse: 77 77 69 68  Resp: 13 13 12 16   Temp:  (!) 97.4 F (36.3 C) 98 F (36.7 C) 97.9 F (36.6 C)  TempSrc:  Oral Oral Axillary  SpO2: 96% 97% 93% 97%  Weight:      Height:        Intake/Output Summary (Last 24 hours) at 01/29/2018 0741 Last data filed at 01/29/2018 0445 Gross per 24 hour  Intake 2658 ml  Output 400 ml  Net 2258 ml   Filed Weights   01/27/18 2333 01/28/18 1454  Weight: 71.7  kg (158 lb) 71.7 kg (158 lb)    Examination: General appearance: Elderly adult female, alert and in no acute distress.    Lying in bed, interactive.  HEENT: Anicteric, conjunctiva pink, left eyelid ptosis, cataracts. No nasal deformity, discharge, epistaxis.  Lips moist, dentition good for age.  Oropharynx normal, moist.   Skin: Warm and dry.  No jaundice.  No suspicious rashes or lesions. Cardiac: RRR, nl S1-S2, no murmurs appreciated.  Capillary refill is brisk.  JVP normal.  No LE edema.  Radial pulses 2+ and symmetric. Respiratory: Normal respiratory rate and rhythm.  CTAB without rales or wheezes. Abdomen: Abdomen soft.  No TTP. No ascites, distension, hepatosplenomegaly.   MSK: No deformities or effusions of the large joints of upper or lower extremities, dressing clean dry and intact. Neuro: Awake and alert.  EOMI, moves all extremities. Speech fluent.    Psych: Sensorium intact and responding to questions, attention normal. Affect normal.  Judgment and insight appear normal=.    Data Reviewed: I have personally reviewed following labs and imaging studies:  CBC: Recent Labs  Lab 01/28/18 0001 01/28/18 2229 01/29/18 0543  WBC 15.0* 12.6* 10.4  NEUTROABS 11.7*  --   --   HGB 12.7 11.5* 10.6*  HCT 39.1 36.7 33.9*  MCV 88.5 89.5 89.2  PLT 248 196 179   Basic Metabolic Panel: Recent Labs  Lab 01/28/18 0001 01/28/18 2229 01/29/18 0543  NA 140  --  137  K 3.5  --  4.3  CL 108  --  108  CO2 21*  --  21*  GLUCOSE 127*  --  124*  BUN 21*  --  15  CREATININE 1.31* 1.13* 1.10*  CALCIUM 9.2  --  8.2*   GFR: Estimated Creatinine Clearance: 29 mL/min (A) (by C-G formula based on SCr of 1.1 mg/dL (H)). Liver Function Tests: Recent Labs  Lab 01/28/18 0001  AST 28  ALT 18  ALKPHOS 72  BILITOT 0.7  PROT 6.9  ALBUMIN 3.4*   No results for input(s): LIPASE, AMYLASE in the last 168 hours. No results for input(s): AMMONIA in the last 168 hours. Coagulation Profile: No  results for input(s): INR, PROTIME in the last 168 hours. Cardiac Enzymes: No results for input(s): CKTOTAL, CKMB, CKMBINDEX, TROPONINI in the last 168 hours. BNP (last 3 results) No results for input(s): PROBNP in the last 8760 hours. HbA1C: No results for input(s): HGBA1C in the last 72 hours. CBG: No results for input(s): GLUCAP in the last 168 hours. Lipid Profile: No results for input(s): CHOL, HDL, LDLCALC, TRIG, CHOLHDL, LDLDIRECT in the last 72 hours. Thyroid Function Tests: No results for input(s): TSH, T4TOTAL, FREET4, T3FREE, THYROIDAB in the last 72 hours. Anemia Panel: No results for input(s): VITAMINB12, FOLATE, FERRITIN, TIBC, IRON, RETICCTPCT in the last 72 hours. Urine analysis:    Component Value Date/Time   COLORURINE YELLOW 01/29/2018 0559   APPEARANCEUR HAZY (A) 01/29/2018 0559   LABSPEC 1.026 01/29/2018 0559   PHURINE 5.0 01/29/2018 0559   GLUCOSEU NEGATIVE 01/29/2018 0559   HGBUR NEGATIVE 01/29/2018 0559   BILIRUBINUR NEGATIVE 01/29/2018 0559   KETONESUR 5 (A) 01/29/2018 0559   PROTEINUR NEGATIVE 01/29/2018 0559   NITRITE NEGATIVE 01/29/2018 0559   LEUKOCYTESUR MODERATE (A) 01/29/2018 0559   Sepsis Labs: @LABRCNTIP (procalcitonin:4,lacticacidven:4)  ) Recent Results (from the past 240 hour(s))  Culture, Urine     Status: Abnormal   Collection Time: 01/19/18  6:46 PM  Result Value Ref Range Status   Specimen Description   Final    URINE, RANDOM [facility] at [facility], [address] [place], [place] [address]    Special Requests   Final    NONE Performed at [facility], [address] [place], [place] [address]    Culture >=100,000 COLONIES/mL KLEBSIELLA PNEUMONIAE (A)  Final   Report Status 01/22/2018 FINAL  Final   Organism ID, Bacteria KLEBSIELLA PNEUMONIAE (A)  Final      Susceptibility   Klebsiella pneumoniae - MIC*    AMPICILLIN >=32 RESISTANT Resistant     CEFAZOLIN <=4 SENSITIVE Sensitive      CEFTRIAXONE <=1 SENSITIVE Sensitive     CIPROFLOXACIN <=0.25 SENSITIVE Sensitive     GENTAMICIN <=1 SENSITIVE Sensitive     IMIPENEM 0.5 SENSITIVE Sensitive     NITROFURANTOIN 64 INTERMEDIATE Intermediate     TRIMETH/SULFA <=20 SENSITIVE Sensitive     AMPICILLIN/SULBACTAM 4 SENSITIVE Sensitive     PIP/TAZO <=4 SENSITIVE Sensitive     Extended ESBL NEGATIVE Sensitive     * >=100,000 COLONIES/mL KLEBSIELLA PNEUMONIAE         Radiology Studies: Dg Chest 2 View  Result Date: 01/27/2018 CLINICAL DATA:  Preop left femoral neck fracture. EXAM: CHEST - 2 VIEW COMPARISON:  01/13/2018 CXR FINDINGS: Mild cardiomegaly with moderate aortic atherosclerosis. No aneurysm. Central vascular congestion without acute pneumonic consolidation. Surgical anchors project over the left humeral head. IMPRESSION: Mild cardiomegaly without acute pulmonary disease. Aortic atherosclerosis. Electronically Signed  By: Tollie Eth M.D.   On: 01/27/2018 23:34   Ct Head Wo Contrast  Result Date: 01/27/2018 CLINICAL DATA:  Fall with hip injury EXAM: CT HEAD WITHOUT CONTRAST TECHNIQUE: Contiguous axial images were obtained from the base of the skull through the vertex without intravenous contrast. COMPARISON:  Radiograph 01/27/2018 FINDINGS: Brain: No acute territorial infarction, hemorrhage or intracranial mass is visualized. Moderate atrophy. Mild small vessel ischemic changes of the white matter. Ventricle size within normal limits Vascular: No hyperdense vessels.  Carotid vascular calcification. Skull: Normal. Negative for fracture or focal lesion. Sinuses/Orbits: Mild mucosal thickening in the maxillary and ethmoid sinuses. No acute orbital abnormality. Other: None. IMPRESSION: 1. No CT evidence for acute intracranial abnormality. 2. Atrophy with small vessel ischemic changes of the white matter Electronically Signed   By: Jasmine Pang M.D.   On: 01/27/2018 23:41   Pelvis Portable  Result Date: 01/28/2018 CLINICAL DATA:   84 y/o  F; left hip hemiarthroplasty postop. EXAM: PORTABLE PELVIS 1-2 VIEWS COMPARISON:  01/27/2018 pelvis radiograph. FINDINGS: Left hip hemiarthroplasty. No periprosthetic lucency or fracture. Air and edema is present within the surrounding soft tissues compatible with postsurgical change. Vascular calcifications noted. IMPRESSION: Left hip hemiarthroplasty and postsurgical changes. No apparent hardware related complication. Electronically Signed   By: Mitzi Hansen M.D.   On: 01/28/2018 18:55   Dg C-arm 1-60 Min  Result Date: 01/28/2018 CLINICAL DATA:  Left hip arthroplasty EXAM: OPERATIVE left HIP (WITH PELVIS IF PERFORMED) 3 VIEWS TECHNIQUE: Fluoroscopic spot image(s) were submitted for interpretation post-operatively. COMPARISON:  Radiograph 01/27/2018 FINDINGS: Three low resolution intraoperative spot views of the left hip. The images demonstrate a left femoral neck fracture with subsequent hip replacement and normal alignment. IMPRESSION: Intraoperative fluoroscopic assistance provided during surgical fixation of left hip fracture Electronically Signed   By: Jasmine Pang M.D.   On: 01/28/2018 18:23   Dg C-arm 1-60 Min  Result Date: 01/28/2018 CLINICAL DATA:  Left hip arthroplasty EXAM: OPERATIVE left HIP (WITH PELVIS IF PERFORMED) 3 VIEWS TECHNIQUE: Fluoroscopic spot image(s) were submitted for interpretation post-operatively. COMPARISON:  Radiograph 01/27/2018 FINDINGS: Three low resolution intraoperative spot views of the left hip. The images demonstrate a left femoral neck fracture with subsequent hip replacement and normal alignment. IMPRESSION: Intraoperative fluoroscopic assistance provided during surgical fixation of left hip fracture Electronically Signed   By: Jasmine Pang M.D.   On: 01/28/2018 18:23   Dg Hip Operative Unilat W Or W/o Pelvis Left  Result Date: 01/28/2018 CLINICAL DATA:  Left hip arthroplasty EXAM: OPERATIVE left HIP (WITH PELVIS IF PERFORMED) 3 VIEWS  TECHNIQUE: Fluoroscopic spot image(s) were submitted for interpretation post-operatively. COMPARISON:  Radiograph 01/27/2018 FINDINGS: Three low resolution intraoperative spot views of the left hip. The images demonstrate a left femoral neck fracture with subsequent hip replacement and normal alignment. IMPRESSION: Intraoperative fluoroscopic assistance provided during surgical fixation of left hip fracture Electronically Signed   By: Jasmine Pang M.D.   On: 01/28/2018 18:23   Dg Hip Unilat With Pelvis 2-3 Views Left  Result Date: 01/27/2018 CLINICAL DATA:  Fall with pain to the left hip EXAM: DG HIP (WITH OR WITHOUT PELVIS) 2-3V LEFT COMPARISON:  CT 01/13/2018 FINDINGS: Residual contrast material within the colon. Pubic symphysis and rami are intact. The right femoral head projects in joint. Acute left femoral neck fracture with superior displacement of the trochanter. Left femoral head projects in joint. IMPRESSION: Acute displaced left femoral neck fracture Electronically Signed   By: Adrian Prows.D.  On: 01/27/2018 23:31        Scheduled Meds: . brimonidine  1 drop Left Eye TID   And  . dorzolamide  1 drop Left Eye TID  . docusate sodium  100 mg Oral BID  . enoxaparin (LOVENOX) injection  30 mg Subcutaneous Q24H  . hydrALAZINE  10 mg Intravenous Once  . ketorolac  7.5 mg Intravenous Q6H  . latanoprost  1 drop Left Eye QHS  . pantoprazole (PROTONIX) IV  40 mg Intravenous Q12H  . polyethylene glycol  17 g Oral Daily  . tranexamic acid (CYKLOKAPRON) topical -INTRAOP  2,000 mg Topical Once   Continuous Infusions: . sodium chloride    .  ceFAZolin (ANCEF) IV Stopped (01/29/18 0037)  . lactated ringers 10 mL/hr at 01/28/18 1459  . methocarbamol (ROBAXIN)  IV Stopped (01/28/18 0427)     LOS: 1 day    Time spent: 25 minuets    Alberteen Sam, MD [facility] 01/29/2018, 2: 45 PM    Pager [phone] --- please page though AMION:  [email or web address] Password  [email or web address] If 7PM-7AM, please contact night-coverage

## 2018-01-30 DIAGNOSIS — R112 Nausea with vomiting, unspecified: Secondary | ICD-10-CM

## 2018-01-30 DIAGNOSIS — D62 Acute posthemorrhagic anemia: Secondary | ICD-10-CM

## 2018-01-30 LAB — CBC
HEMATOCRIT: 32.8 % — AB (ref 36.0–46.0)
Hemoglobin: 10.2 g/dL — ABNORMAL LOW (ref 12.0–15.0)
MCH: 27.8 pg (ref 26.0–34.0)
MCHC: 31.1 g/dL (ref 30.0–36.0)
MCV: 89.4 fL (ref 78.0–100.0)
Platelets: 181 10*3/uL (ref 150–400)
RBC: 3.67 MIL/uL — ABNORMAL LOW (ref 3.87–5.11)
RDW: 16.8 % — AB (ref 11.5–15.5)
WBC: 12.8 10*3/uL — ABNORMAL HIGH (ref 4.0–10.5)

## 2018-01-30 LAB — GLUCOSE, CAPILLARY: Glucose-Capillary: 92 mg/dL (ref 65–99)

## 2018-01-30 MED ORDER — IRBESARTAN 150 MG PO TABS
75.0000 mg | ORAL_TABLET | Freq: Every day | ORAL | Status: DC
  Administered 2018-01-31: 75 mg via ORAL
  Filled 2018-01-30 (×2): qty 1

## 2018-01-30 NOTE — Clinical Social Work Note (Signed)
Facility confirmed they will have a private room for pt tomorrow. Pt and pt's family notified.   HoriconBridget Loranzo Desha, [place] [phone]

## 2018-01-30 NOTE — Progress Notes (Signed)
PROGRESS NOTE    [name]  ZOX:[id number] DOB: 1919/12/22 DOA: 01/27/2018 PCP: [name], MD      Brief Narrative:  Donna Summers is a 84 y.o. F with HTN who presents with hip pain after a fall.  Found to have left femoral neck fracture.   Assessment & Plan:  Left hip fracture Doing very well, only using Tylenol and Robaxin for pain -Continue acetaminophen, Robaxin, as needed Norco for pain -Stop ketorolac morphine, higher dose Norco  -Apply ice, document sedation and vitals per Hip fracture protocol -Nutrition/PT consulted -Reasonable to consider osteoporosis screening after discharge if not done previously -Recommend vitamin D 800 IUand calcium 1200 mg supplementation after discharge   Hypoxia From atelectasis -Spot pulse ox -OOB, IS, wean O2 as able  Hypertension Hypertensive urgency BP good -Continue irbesartan   CKD stage III Baseline creatinine 1.1, stable no change.  Acute blood loss anemia Expected drop after surgery, no change.    DVT prophylaxis: Lovenox, SCDs Code Status: DO NOT RESUSCITATE Family Communication: Sons at the bedside MDM and disposition Plan: The below labs and imaging reports were reviewed and summarized above.  The patient was admitted for left hip fracture, now status post left hemiarthroplasty.  Renal function stable, expected mild ABLA.  Mild hypoxia from atelectasis.  Likely DC to SNF tomorrow.    Consultants:   Orthopedics  Procedures:   LEFT hemiarthroplasty 4/12    Subjective: Feeling well.  No chest pain, dyspnea, palpitations, cough, sputum production.  Got up with physical therapy yesterday did well.  Left leg and hip pain controlled well with acetaminophen and Robaxin.  No new confusion, seizures, fever.     Objective: Vitals:   01/29/18 1719 01/29/18 1801 01/29/18 2022 01/30/18 0458  BP: (!) 127/45 (!) 137/53 (!) 129/50 (!) 125/50  Pulse: 82 84 74 76  Resp:      Temp:   98.5 F (36.9 C) 98.3 F  (36.8 C)  TempSrc:   Oral Oral  SpO2: 94%  96% 94%  Weight:      Height:        Intake/Output Summary (Last 24 hours) at 01/30/2018 1031 Last data filed at 01/30/2018 0500 Gross per 24 hour  Intake 512.5 ml  Output 400 ml  Net 112.5 ml   Filed Weights   01/27/18 2333 01/28/18 1454  Weight: 71.7 kg (158 lb) 71.7 kg (158 lb)    Examination: General appearance: Elderly female, lying in bed, pleasant, interactive.  No acute distress. HEENT: Anicteric, conjunctival pink, left eye lid ptosis, cataracts.  No nasal deformity, discharge, epistaxis.  Lips moist, dentition normal for age.  Oropharynx without lesions, OP moist.   Skin: Skin warm and dry, no jaundice, no suspicious rashes or lesions. Cardiac: Heart rate regular, soft systolic murmur.  JVP normal, no lower extremity edema. Respiratory: Respiratory effort normal.  No accessory muscle use.  No rales, no wheezes. Abdomen: Abdomen soft without tenderness to palpation in all quadrants.  No ascites, distention, hepatospleno megaly.   MSK: Normal t. Neuro: Alert, extraocular movements intact, cranial nerves grossly symmetric, moves all extremities except the left hip.  Speech fluent.    Psych: Sensorium intact and responding to questions, oriented to person, place, time and situation, affect normal, attention normal, judgment and insight normal.    Data Reviewed: I have personally reviewed following labs and imaging studies:  CBC: Recent Labs  Lab 01/28/18 0001 01/28/18 2229 01/29/18 0543 01/30/18 0710  WBC 15.0* 12.6* 10.4 12.8*  NEUTROABS 11.7*  --   --   --   HGB 12.7 11.5* 10.6* 10.2*  HCT 39.1 36.7 33.9* 32.8*  MCV 88.5 89.5 89.2 89.4  PLT 248 196 179 181   Basic Metabolic Panel: Recent Labs  Lab 01/28/18 0001 01/28/18 2229 01/29/18 0543  NA 140  --  137  K 3.5  --  4.3  CL 108  --  108  CO2 21*  --  21*  GLUCOSE 127*  --  124*  BUN 21*  --  15  CREATININE 1.31* 1.13* 1.10*  CALCIUM 9.2  --  8.2*    GFR: Estimated Creatinine Clearance: 29 mL/min (A) (by C-G formula based on SCr of 1.1 mg/dL (H)). Liver Function Tests: Recent Labs  Lab 01/28/18 0001  AST 28  ALT 18  ALKPHOS 72  BILITOT 0.7  PROT 6.9  ALBUMIN 3.4*   No results for input(s): LIPASE, AMYLASE in the last 168 hours. No results for input(s): AMMONIA in the last 168 hours. Coagulation Profile: No results for input(s): INR, PROTIME in the last 168 hours. Cardiac Enzymes: No results for input(s): CKTOTAL, CKMB, CKMBINDEX, TROPONINI in the last 168 hours. BNP (last 3 results) No results for input(s): PROBNP in the last 8760 hours. HbA1C: No results for input(s): HGBA1C in the last 72 hours. CBG: No results for input(s): GLUCAP in the last 168 hours. Lipid Profile: No results for input(s): CHOL, HDL, LDLCALC, TRIG, CHOLHDL, LDLDIRECT in the last 72 hours. Thyroid Function Tests: No results for input(s): TSH, T4TOTAL, FREET4, T3FREE, THYROIDAB in the last 72 hours. Anemia Panel: No results for input(s): VITAMINB12, FOLATE, FERRITIN, TIBC, IRON, RETICCTPCT in the last 72 hours. Urine analysis:    Component Value Date/Time   COLORURINE YELLOW 01/29/2018 0559   APPEARANCEUR HAZY (A) 01/29/2018 0559   LABSPEC 1.026 01/29/2018 0559   PHURINE 5.0 01/29/2018 0559   GLUCOSEU NEGATIVE 01/29/2018 0559   HGBUR NEGATIVE 01/29/2018 0559   BILIRUBINUR NEGATIVE 01/29/2018 0559   KETONESUR 5 (A) 01/29/2018 0559   PROTEINUR NEGATIVE 01/29/2018 0559   NITRITE NEGATIVE 01/29/2018 0559   LEUKOCYTESUR MODERATE (A) 01/29/2018 0559   Sepsis Labs: @LABRCNTIP (procalcitonin:4,lacticacidven:4)  ) No results found for this or any previous visit (from the past 240 hour(s)).       Radiology Studies: Pelvis Portable  Result Date: 01/28/2018 CLINICAL DATA:  84 y/o  F; left hip hemiarthroplasty postop. EXAM: PORTABLE PELVIS 1-2 VIEWS COMPARISON:  01/27/2018 pelvis radiograph. FINDINGS: Left hip hemiarthroplasty. No  periprosthetic lucency or fracture. Air and edema is present within the surrounding soft tissues compatible with postsurgical change. Vascular calcifications noted. IMPRESSION: Left hip hemiarthroplasty and postsurgical changes. No apparent hardware related complication. Electronically Signed   By: Mitzi Hansen M.D.   On: 01/28/2018 18:55   Dg C-arm 1-60 Min  Result Date: 01/28/2018 CLINICAL DATA:  Left hip arthroplasty EXAM: OPERATIVE left HIP (WITH PELVIS IF PERFORMED) 3 VIEWS TECHNIQUE: Fluoroscopic spot image(s) were submitted for interpretation post-operatively. COMPARISON:  Radiograph 01/27/2018 FINDINGS: Three low resolution intraoperative spot views of the left hip. The images demonstrate a left femoral neck fracture with subsequent hip replacement and normal alignment. IMPRESSION: Intraoperative fluoroscopic assistance provided during surgical fixation of left hip fracture Electronically Signed   By: Jasmine Pang M.D.   On: 01/28/2018 18:23   Dg C-arm 1-60 Min  Result Date: 01/28/2018 CLINICAL DATA:  Left hip arthroplasty EXAM: OPERATIVE left HIP (WITH PELVIS IF PERFORMED) 3 VIEWS TECHNIQUE: Fluoroscopic spot image(s) were submitted for interpretation  post-operatively. COMPARISON:  Radiograph 01/27/2018 FINDINGS: Three low resolution intraoperative spot views of the left hip. The images demonstrate a left femoral neck fracture with subsequent hip replacement and normal alignment. IMPRESSION: Intraoperative fluoroscopic assistance provided during surgical fixation of left hip fracture Electronically Signed   By: Jasmine PangKim  Fujinaga M.D.   On: 01/28/2018 18:23   Dg Hip Operative Unilat W Or W/o Pelvis Left  Result Date: 01/28/2018 CLINICAL DATA:  Left hip arthroplasty EXAM: OPERATIVE left HIP (WITH PELVIS IF PERFORMED) 3 VIEWS TECHNIQUE: Fluoroscopic spot image(s) were submitted for interpretation post-operatively. COMPARISON:  Radiograph 01/27/2018 FINDINGS: Three low resolution  intraoperative spot views of the left hip. The images demonstrate a left femoral neck fracture with subsequent hip replacement and normal alignment. IMPRESSION: Intraoperative fluoroscopic assistance provided during surgical fixation of left hip fracture Electronically Signed   By: Jasmine PangKim  Fujinaga M.D.   On: 01/28/2018 18:23        Scheduled Meds: . brimonidine  1 drop Left Eye TID   And  . dorzolamide  1 drop Left Eye TID  . docusate sodium  100 mg Oral BID  . enoxaparin (LOVENOX) injection  30 mg Subcutaneous Q24H  . irbesartan  75 mg Oral Daily  . latanoprost  1 drop Left Eye QHS  . pantoprazole  40 mg Oral BID AC  . polyethylene glycol  17 g Oral Daily  . tranexamic acid (CYKLOKAPRON) topical -INTRAOP  2,000 mg Topical Once   Continuous Infusions: . methocarbamol (ROBAXIN)  IV Stopped (01/28/18 0427)     LOS: 2 days    Time spent: 5 minutes    Alberteen Samhristopher P Ladasha Schnackenberg, MD [facility] 01/30/2018, 10:00AM    Pager [phone] --- please page though AMION:  [email or web address] Password [email or web address] If 7PM-7AM, please contact night-coverage

## 2018-01-30 NOTE — Progress Notes (Signed)
RN attempting to wean pt off oxygen. When checking O2 sats pt would be in low 80s to upper 70s at rest. Sats would come up to low 90s with use of incentive but then when rechecked each time would be back down into low 80s. Pt put back on 1liter O2, sats in low 90s. Will continue to encourage IS.

## 2018-01-31 ENCOUNTER — Inpatient Hospital Stay (HOSPITAL_COMMUNITY): Payer: Medicare Other

## 2018-01-31 ENCOUNTER — Encounter (HOSPITAL_COMMUNITY): Payer: Self-pay | Admitting: Orthopaedic Surgery

## 2018-01-31 DIAGNOSIS — J9621 Acute and chronic respiratory failure with hypoxia: Secondary | ICD-10-CM

## 2018-01-31 LAB — BASIC METABOLIC PANEL
Anion gap: 7 (ref 5–15)
BUN: 18 mg/dL (ref 6–20)
CHLORIDE: 109 mmol/L (ref 101–111)
CO2: 23 mmol/L (ref 22–32)
CREATININE: 1.2 mg/dL — AB (ref 0.44–1.00)
Calcium: 8.3 mg/dL — ABNORMAL LOW (ref 8.9–10.3)
GFR calc non Af Amer: 37 mL/min — ABNORMAL LOW (ref >60)
GFR, EST AFRICAN AMERICAN: 43 mL/min — AB (ref >60)
Glucose, Bld: 87 mg/dL (ref 65–99)
POTASSIUM: 3.7 mmol/L (ref 3.5–5.1)
Sodium: 139 mmol/L (ref 135–145)

## 2018-01-31 LAB — CBC
HEMATOCRIT: 30.2 % — AB (ref 36.0–46.0)
HEMOGLOBIN: 9.5 g/dL — AB (ref 12.0–15.0)
MCH: 28.4 pg (ref 26.0–34.0)
MCHC: 31.5 g/dL (ref 30.0–36.0)
MCV: 90.4 fL (ref 78.0–100.0)
Platelets: 183 10*3/uL (ref 150–400)
RBC: 3.34 MIL/uL — ABNORMAL LOW (ref 3.87–5.11)
RDW: 17.2 % — ABNORMAL HIGH (ref 11.5–15.5)
WBC: 8.6 10*3/uL (ref 4.0–10.5)

## 2018-01-31 MED ORDER — KETOROLAC TROMETHAMINE 15 MG/ML IJ SOLN
15.0000 mg | Freq: Three times a day (TID) | INTRAMUSCULAR | Status: AC
  Administered 2018-02-01: 15 mg via INTRAVENOUS
  Filled 2018-01-31 (×3): qty 1

## 2018-01-31 MED ORDER — FUROSEMIDE 10 MG/ML IJ SOLN
40.0000 mg | Freq: Once | INTRAMUSCULAR | Status: AC
  Administered 2018-01-31: 40 mg via INTRAVENOUS
  Filled 2018-01-31: qty 4

## 2018-01-31 NOTE — Progress Notes (Signed)
Occupational Therapy Treatment Patient Details Name: Donna Summers MRN: [id number] DOB: 02-15-1920 Today's Date: 01/31/2018    History of present illness Donna Summers is a 84 y.o. female who presents with left hip fracture s/p mechanical fall PTA at her SNF.  She was recently discharged from hospital for esophagitis and discharged to [facility].  The patient endorses severe pain in the left hip, that does not radiate, grinding in quality, worse with any movement, better with immobilization.  Denies LOC/fever/chills/nausea/vomiting.  Walks with walker at baseline.  Denies LOC, neck pain, abd pain. now s/p L hip hemiarthoplasty.   OT comments  Pt presents supine in bed, sleepy but agreeable to OT tx session. Limited session due to transport arriving to take pt for x-ray. Pt requiring MaxA for bed mobility this session, increased time due to LLE pain. Currently requiring setup assist for grooming and self-feeding ADLs, maxA for LB ADLs. Feel SNF recommendation remains appropriate at this time. Will continue to follow acutely to progress pt towards established OT goals.   Follow Up Recommendations  SNF    Equipment Recommendations  None recommended by OT          Precautions / Restrictions Precautions Precautions: Fall Precaution Comments: Anterior approach. No hip precautions per MD note on 01/28/18. Restrictions Weight Bearing Restrictions: Yes LLE Weight Bearing: Weight bearing as tolerated       Mobility Bed Mobility Overal bed mobility: Needs Assistance Bed Mobility: Sit to Supine     Supine to sit: Max assist Sit to supine: Max assist   General bed mobility comments: pt requiring assist to advance LLE and scoot hips towards EOB, increased time due to pain; assist for LEs when returning to supine  Transfers                 General transfer comment: NT due to transport arriving to take pt to x-ray                                                 ADL either performed or assessed with clinical judgement   ADL Overall ADL's : Needs assistance/impaired Eating/Feeding: Set up;Sitting   Grooming: Set up;Sitting                                 General ADL Comments: pt with increased pain in LLE with mobility; noted SBP to be 200 earlier today, vitals taken and initially 212/65; taken again with different BP cuff and 188/60. Upon initiating sitting EOB transport arriving to take pt to x-ray therefore further mobility deferred; returned to supine and positioned for comfort                        Cognition Arousal/Alertness: Awake/alert Behavior During [facility]: [facility] tasks assessed/performed Overall Cognitive Status: Within Functional Limits for tasks assessed Area of Impairment: Memory                     Memory: Decreased short-term memory         General Comments: decreased STM noted during session, pt stating she ate lunch however lunch tray was not touched at start of session        Exercises Other Exercises Other Exercises: ankle pumps LLE x10  in supine                Pertinent Vitals/ Pain       Pain Assessment: Faces Faces Pain Scale: Hurts even more Pain Location: L hip Pain Descriptors / Indicators: Discomfort;Grimacing Pain Intervention(s): Limited activity within patient's tolerance;Monitored during session;Repositioned                                                          Frequency  Min 2X/week        Progress Toward Goals  OT Goals(current goals can now be found in the care plan section)  Progress towards OT goals: Progressing toward goals  Acute Rehab OT Goals Patient Stated Goal: Go to rehab OT Goal Formulation: With patient/family Time For Goal Achievement: 02/12/18 Potential to Achieve Goals: Good  Plan Discharge plan remains appropriate                    AM-PAC PT "6 Clicks" Daily Activity     Outcome  Measure   Help from another person eating meals?: None Help from another person taking care of personal grooming?: A Little Help from another person toileting, which includes using toliet, bedpan, or urinal?: A Lot Help from another person bathing (including washing, rinsing, drying)?: A Lot Help from another person to put on and taking off regular upper body clothing?: A Little Help from another person to put on and taking off regular lower body clothing?: A Lot 6 Click Score: 16    End of Session    OT Visit Diagnosis: Unsteadiness on feet (R26.81);History of falling (Z91.81);Muscle weakness (generalized) (M62.81)   Activity Tolerance Patient limited by pain;Patient tolerated treatment well   Patient Left in bed;with call bell/phone within reach;Other (comment)(transport present to take pt to x-ray)   Nurse Communication Mobility status        Time: 1610-9604 OT Time Calculation (min): 16 min  Charges: OT General Charges $OT Visit: 1 Visit OT Treatments $Self Care/Home Management : 8-22 mins  Marcy Siren, OT Pager [phone] 01/31/2018    [name] 01/31/2018, 2:10 PM

## 2018-01-31 NOTE — Progress Notes (Signed)
Physical Therapy Treatment Patient Details Name: Donna Summers MRN: [id number] DOB: November 26, 1919 Today's Date: 01/31/2018    History of Present Illness Donna Summers is a 84 y.o. female who presents with left hip fracture s/p mechanical fall PTA at her SNF.  She was recently discharged from hospital for esophagitis and discharged to [facility].  The patient endorses severe pain in the left hip, that does not radiate, grinding in quality, worse with any movement, better with immobilization.  Denies LOC/fever/chills/nausea/vomiting.  Walks with walker at baseline.  Denies LOC, neck pain, abd pain. now s/p L hip hemiarthoplasty.    PT Comments    Pt performed sit to stand but unable to pivot from bed to chair.  Ultimately she required the bed to be moved and chair placed behind patient.  Pt found with urinary incontinence on arrival but with assistance she was able to perform partial pericare.  Pt sitting in recliner post session with meal tray in front of her.  Plan next session for transfer training and initiation of gait training to tolerance.  Pt remains unable to advance gait training due to pain and weakness and continues to benefit from skilled rehab at SNF at d/c.      Follow Up Recommendations  SNF;Supervision/Assistance - 24 hour     Equipment Recommendations  None recommended by PT    Recommendations for Other Services       Precautions / Restrictions Precautions Precautions: Fall Precaution Comments: Anterior approach. No hip precautions per MD note on 01/28/18. Restrictions Weight Bearing Restrictions: Yes LLE Weight Bearing: Weight bearing as tolerated    Mobility  Bed Mobility Overal bed mobility: Needs Assistance Bed Mobility: Supine to Sit     Supine to sit: Max assist Sit to supine: Max assist   General bed mobility comments: Pt required assistance to elevate trunk into sitting and to advance hips to edge of bed.  Assistance also provided for LLE  advancement due to pain.    Transfers Overall transfer level: Needs assistance Equipment used: Rolling walker (2 wheeled) Transfers: Sit to/from Stand Sit to Stand: Mod assist;+2 safety/equipment         General transfer comment: Cues for hand placement to achieve standing.  pt stood for pericare.  Pt able to wipe the front of her self in standing with mod assistance to maintain standing.    Ambulation/Gait Ambulation/Gait assistance: (Pt unable to progress gait due to pain and weakness.  )               Stairs             Wheelchair Mobility    Modified Rankin (Stroke Patients Only)       Balance Overall balance assessment: Needs assistance Sitting-balance support: No upper extremity supported;Feet supported Sitting balance-Leahy Scale: Fair       Standing balance-Leahy Scale: Poor Standing balance comment: reliant on UEs static an dynamic                            Cognition Arousal/Alertness: Awake/alert Behavior During Therapy: WFL for tasks assessed/performed Overall Cognitive Status: Within Functional Limits for tasks assessed Area of Impairment: Memory                     Memory: Decreased short-term memory         General Comments: decreased STM noted during session, pt stating she ate lunch however lunch tray was  not touched at start of session      Exercises Other Exercises Other Exercises: ankle pumps LLE x10 in supine    General Comments        Pertinent Vitals/Pain Pain Assessment: Faces Faces Pain Scale: Hurts even more Pain Location: L hip Pain Descriptors / Indicators: Discomfort;Grimacing Pain Intervention(s): Monitored during session;Premedicated before session    Home Living                      Prior Function            PT Goals (current goals can now be found in the care plan section) Acute Rehab PT Goals Patient Stated Goal: Go to rehab Potential to Achieve Goals: Good Progress  towards PT goals: Progressing toward goals    Frequency    Min 3X/week      PT Plan Current plan remains appropriate    Co-evaluation              AM-PAC PT "6 Clicks" Daily Activity  Outcome Measure  Difficulty turning over in bed (including adjusting bedclothes, sheets and blankets)?: Unable Difficulty moving from lying on back to sitting on the side of the bed? : Unable Difficulty sitting down on and standing up from a chair with arms (e.g., wheelchair, bedside commode, etc,.)?: Unable Help needed moving to and from a bed to chair (including a wheelchair)?: A Lot Help needed walking in hospital room?: A Lot Help needed climbing 3-5 steps with a railing? : Total 6 Click Score: 8    End of Session Equipment Utilized During Treatment: Gait belt Activity Tolerance: Patient tolerated treatment well Patient left: with call bell/phone within reach;with family/visitor present;in chair Nurse Communication: Mobility status PT Visit Diagnosis: Unsteadiness on feet (R26.81)     Time: 1630-1650 PT Time Calculation (min) (ACUTE ONLY): 20 min  Charges:  $Therapeutic Activity: 8-22 mins                    G CodesJoycelyn Rua:       Joscelynn Brutus, PTA pager [phone]    Florestine AversAimee J Joshiah Traynham 01/31/2018, 4:58 PM

## 2018-01-31 NOTE — Progress Notes (Signed)
Spoke with Danna RN regarding request for PIV. No current orders for IV meds at this time. Will hold off on placing IV at this time. Instructed to notify [facility] for further needs.

## 2018-01-31 NOTE — Progress Notes (Signed)
PROGRESS NOTE    [name]  EXB:[id number] DOB: 11/28/1919 DOA: 01/27/2018 PCP: [name], MD      Brief Narrative:  [name] is a 84 y.o. F with HTN who presents with hip pain after a fall.  Found to have left femoral neck fracture.   Assessment & Plan:  Left hip fracture Repaired 4/12.  Doing very well, only using Tylenol and Robaxin for pain -Continue acetaminophen, Robaxin -Ketorolac scheduled -Stop Norco -Reasonable to consider osteoporosis screening after discharge if not done previously -Recommend vitamin D 800 IUand calcium 1200 mg supplementation after discharge   Hypoxia Combination atelectasis and fluid overload, not CHF.   CXR today because patient unable to wean O2, showed small effusions, interstitial edema -Furosemide 40 mg IV once -Strict I/Os -Monitor creatinine tomorrow -OOB, IS, wean O2 as able  Hypertension Hypertensive urgency BP good -Continue irbesartan  CKD stage III Baseline creatinine 1.1, stable no change.  Acute blood loss anemia Expected drop after surgery, no change.    DVT prophylaxis: Lovenox, SCDs Code Status: DO NOT RESUSCITATE Family Communication: Sons at the bedside MDM and disposition Plan:  The below labs and imaging reports were reviewed and summarized above.  The chest x-ray was ordered, personally interpreted, showed pulmonary edema, small pleural effusions.  The patient was admitted for left hip fracture, now status post left knee arthroplasty.  Renal function stable.  Expected mild ABLA.  Hypoxia, chest x-ray shows some edema.  We will diurese, if weaned off oxygen tomorrow likely home tomorrow.      Consultants:   Orthopedics  Procedures:   LEFT hemiarthroplasty 4/12    Subjective: Much sleepier today, decreased appetite.  No chest pain, dyspnea, palpitations, cough, sputum.  No new fever.  Got up with physical therapy yesterday but pretty weak in the afternoon.  Leg pain well controlled.  No leg  swelling.  No orthopnea.  Objective: Vitals:   01/30/18 1615 01/30/18 1721 01/30/18 1722 01/30/18 1937  BP:    (!) 139/59  Pulse:    75  Resp:    18  Temp:    98.8 F (37.1 C)  TempSrc:    Oral  SpO2: 90% (!) 85% 93% 91%  Weight:      Height:        Intake/Output Summary (Last 24 hours) at 01/31/2018 0830 Last data filed at 01/30/2018 1715 Gross per 24 hour  Intake 120 ml  Output -  Net 120 ml   Filed Weights   01/27/18 2333 01/28/18 1454  Weight: 71.7 kg (158 lb) 71.7 kg (158 lb)    Examination: General appearance: Female, lying in bed, sedated, less interactive today. HEENT: Anicteric, left eye ptosis and cataracts.  No nasal deformity, discharge, epistaxis.  Lips moist, dentition normal.  Oropharynx without lesions, moist.    Skin: Skin warm and dry without jaundice, no suspicious rashes, lesions, induration, redness. Cardiac: Heart rate regular, soft systolic murmur.  JVP normal, no lower extremity edema. Respiratory: Respiratory rate increased.  No rales, wheezes.  Overall lung sounds diminished. Abdomen: Abdomen soft without tenderness to palpation in all quadrants.  No ascites, distention, hepatospleno megaly.   MSK: Left hip dressing clean and dry. Neuro: Awake, cranial nerves grossly symmetric except for proptosis, speech fluent.    Psych: Much more sleepy, not oriented to person place or time.  Affect blunted.  Judgment and insight appear impaired.       Data Reviewed: I have personally reviewed following labs and imaging studies:  CBC: Recent Labs  Lab 01/28/18 0001 01/28/18 2229 01/29/18 0543 01/30/18 0710 01/31/18 0705  WBC 15.0* 12.6* 10.4 12.8* 8.6  NEUTROABS 11.7*  --   --   --   --   HGB 12.7 11.5* 10.6* 10.2* 9.5*  HCT 39.1 36.7 33.9* 32.8* 30.2*  MCV 88.5 89.5 89.2 89.4 90.4  PLT 248 196 179 181 183   Basic Metabolic Panel: Recent Labs  Lab 01/28/18 0001 01/28/18 2229 01/29/18 0543 01/31/18 0705  NA 140  --  137 139  K 3.5  --  4.3  3.7  CL 108  --  108 109  CO2 21*  --  21* 23  GLUCOSE 127*  --  124* 87  BUN 21*  --  15 18  CREATININE 1.31* 1.13* 1.10* 1.20*  CALCIUM 9.2  --  8.2* 8.3*   GFR: Estimated Creatinine Clearance: 26.6 mL/min (A) (by C-G formula based on SCr of 1.2 mg/dL (H)). Liver Function Tests: Recent Labs  Lab 01/28/18 0001  AST 28  ALT 18  ALKPHOS 72  BILITOT 0.7  PROT 6.9  ALBUMIN 3.4*   No results for input(s): LIPASE, AMYLASE in the last 168 hours. No results for input(s): AMMONIA in the last 168 hours. Coagulation Profile: No results for input(s): INR, PROTIME in the last 168 hours. Cardiac Enzymes: No results for input(s): CKTOTAL, CKMB, CKMBINDEX, TROPONINI in the last 168 hours. BNP (last 3 results) No results for input(s): PROBNP in the last 8760 hours. HbA1C: No results for input(s): HGBA1C in the last 72 hours. CBG: Recent Labs  Lab 01/30/18 2016  GLUCAP 92   Lipid Profile: No results for input(s): CHOL, HDL, LDLCALC, TRIG, CHOLHDL, LDLDIRECT in the last 72 hours. Thyroid Function Tests: No results for input(s): TSH, T4TOTAL, FREET4, T3FREE, THYROIDAB in the last 72 hours. Anemia Panel: No results for input(s): VITAMINB12, FOLATE, FERRITIN, TIBC, IRON, RETICCTPCT in the last 72 hours. Urine analysis:    Component Value Date/Time   COLORURINE YELLOW 01/29/2018 0559   APPEARANCEUR HAZY (A) 01/29/2018 0559   LABSPEC 1.026 01/29/2018 0559   PHURINE 5.0 01/29/2018 0559   GLUCOSEU NEGATIVE 01/29/2018 0559   HGBUR NEGATIVE 01/29/2018 0559   BILIRUBINUR NEGATIVE 01/29/2018 0559   KETONESUR 5 (A) 01/29/2018 0559   PROTEINUR NEGATIVE 01/29/2018 0559   NITRITE NEGATIVE 01/29/2018 0559   LEUKOCYTESUR MODERATE (A) 01/29/2018 0559   Sepsis Labs: @LABRCNTIP (procalcitonin:4,lacticacidven:4)  ) No results found for this or any previous visit (from the past 240 hour(s)).       Radiology Studies: No results found.      Scheduled Meds: . brimonidine  1 drop Left  Eye TID   And  . dorzolamide  1 drop Left Eye TID  . docusate sodium  100 mg Oral BID  . enoxaparin (LOVENOX) injection  30 mg Subcutaneous Q24H  . irbesartan  75 mg Oral Daily  . latanoprost  1 drop Left Eye QHS  . pantoprazole  40 mg Oral BID AC  . polyethylene glycol  17 g Oral Daily  . tranexamic acid (CYKLOKAPRON) topical -INTRAOP  2,000 mg Topical Once   Continuous Infusions: . methocarbamol (ROBAXIN)  IV Stopped (01/28/18 0427)     LOS: 3 days    Time spent: 35 minutes    Alberteen Samhristopher P Danford, MD [facility] 01/31/2018, 0830    Pager [phone] --- please page though AMION:  [email or web address] If 7PM-7AM, please contact night-coverage

## 2018-01-31 NOTE — Progress Notes (Signed)
O2 sat on room air checked and found to be low - mid 80's.  2L O2 via [place] applied and sat rose to 96.  Will leave O2 on and continue to monitor.

## 2018-01-31 NOTE — Social Work (Signed)
CSW met with son, Vevelyn Royals, at bedside and confirmed SNF-[name].  CSW will set up transport  CSW will continue to follow for disposition.  Elissa Hefty, LCSW Clinical Social Worker [phone]

## 2018-01-31 NOTE — Plan of Care (Signed)

## 2018-02-01 ENCOUNTER — Inpatient Hospital Stay (HOSPITAL_COMMUNITY): Payer: Medicare Other

## 2018-02-01 DIAGNOSIS — M79609 Pain in unspecified limb: Secondary | ICD-10-CM

## 2018-02-01 DIAGNOSIS — L8990 Pressure ulcer of unspecified site, unspecified stage: Secondary | ICD-10-CM

## 2018-02-01 LAB — BASIC METABOLIC PANEL WITH GFR
Anion gap: 10 (ref 5–15)
BUN: 17 mg/dL (ref 6–20)
CO2: 24 mmol/L (ref 22–32)
Calcium: 8.5 mg/dL — ABNORMAL LOW (ref 8.9–10.3)
Chloride: 106 mmol/L (ref 101–111)
Creatinine, Ser: 1.18 mg/dL — ABNORMAL HIGH (ref 0.44–1.00)
GFR calc Af Amer: 44 mL/min — ABNORMAL LOW (ref >60)
GFR calc non Af Amer: 38 mL/min — ABNORMAL LOW (ref >60)
Glucose, Bld: 92 mg/dL (ref 65–99)
Potassium: 3.5 mmol/L (ref 3.5–5.1)
Sodium: 140 mmol/L (ref 135–145)

## 2018-02-01 LAB — PROCALCITONIN: Procalcitonin: <0.1 ng/mL

## 2018-02-01 MED ORDER — SENNOSIDES-DOCUSATE SODIUM 8.6-50 MG PO TABS
1.0000 | ORAL_TABLET | Freq: Two times a day (BID) | ORAL | Status: DC
  Administered 2018-02-01 – 2018-02-02 (×3): 1 via ORAL
  Filled 2018-02-01 (×3): qty 1

## 2018-02-01 MED ORDER — KETOROLAC TROMETHAMINE 15 MG/ML IJ SOLN
15.0000 mg | Freq: Three times a day (TID) | INTRAMUSCULAR | Status: DC | PRN
  Administered 2018-02-01: 15 mg via INTRAVENOUS

## 2018-02-01 MED ORDER — HYDRALAZINE HCL 20 MG/ML IJ SOLN
10.0000 mg | Freq: Four times a day (QID) | INTRAMUSCULAR | Status: DC | PRN
  Administered 2018-02-01: 10 mg via INTRAVENOUS
  Filled 2018-02-01: qty 1

## 2018-02-01 NOTE — Social Work (Signed)
[facility]  CSW will continue f/u for disposition.  Keene BreathPatricia Victorhugo Preis, LCSW Clinical Social Worker [phone]

## 2018-02-01 NOTE — Progress Notes (Signed)
PROGRESS NOTE    Donna Summers  JWJ:[id number] DOB: March 28, 1920 DOA: 01/27/2018 PCP: Wanda PlumpPaz, Jose E, MD      Brief Narrative:  [name] is a 84 y.o. F with HTN who presents with hip pain after a fall.  Found to have left femoral neck fracture.   Assessment & Plan:  Left hip fracture Repaired 4/12.  Doing well initially, declined over last two days.  Now sluggish, weak.    -Continue acetaminophen, Robaxin and Ketorolac PRN for pain -Reasonable to consider osteoporosis screening after discharge if not done previously -Recommend vitamin D 800 IUand calcium 1200 mg supplementation after discharge -DVT ppx per Orthopedics   Hypoxia Combination atelectasis and fluid overload, not CHF.   No previous history of CHF or lung disease.  Lungs clear today.  CXR yesterday showed edema, small effusions.  No pneumonia, no fever.  Procalcitonin today negative.  Renal function good after Lasix.    -OOB, IS, wean O2 as able  Encephalopathy Decreased sensorium today.  Per son, this has been a recurrent pattern for her (alert and pleasant and interactive for a while, then spells lasting a few days where she is sluggish, weak, barely able to move and just has eyes closed).  Has been brought to hospital each time, different diagnosis each time (UTI once, hypertension once, dysphagia and dehydration once, etc). -Encourage PO intake -Avoid sedating medicines -Encourage PT -Monitor for fever  -Wean O2 -Increase bowel regimen  Leg pain New right leg pain today, in calf, thigh.  In TED hose, but there is some nonpitting edema.  Doubt DVT, but will rule out with US. -LE US ordered  Hypertension Hypertensive urgency BP very labile.  Suspect wide pulse pressures reflect hardened arteries, not hypertension. -Continue irbesartan  CKD stage III Baseline creatinine 1.1, stable no change.  ABLA Expected Hgb drop after surgery, no change today.         DVT prophylaxis: Lovenox, SCDs Code  Status: DO NOT RESUSCITATE Family Communication: Sons at the bedside MDM and disposition Plan:  The below labs and imaging reports were reviewed and summarized above.  Medication changes as above, imaging ordered as above.    The patient was admitted for left hip fracture, now status post left knee arthroplasty.  Renal function stable.  Expected mild ABLA.   However, now hypoxia and decreased sensorium delay discahrge.  Chest x-ray showed edema, got Lasix, lungs clear today.  Continue to attempt to wean O2.    For encephalopathy, she is not on opiates or other psychoactive meds.  Doubt UTI, no symptoms. No fever.  Dehydration?  Constipation?    Consultants:   Orthopedics  Procedures:   LEFT hemiarthroplasty 4/12    Subjective: Still very sleepy today.  No appetite.  No chest pain, dyspnea, palpitations, cough, sputum.  No dysuria, abdominal pain, urinary frequency.  Poor UOP this morning.  No fever.  New right leg pain in calf and thigh.     Objective: Vitals:   01/31/18 1200 01/31/18 2007 02/01/18 0437 02/01/18 0608  BP:  (!) 134/52 (!) 209/60 (!) 127/37  Pulse:  68 65 79  Resp:  20    Temp:  99 F (37.2 C) 97.7 F (36.5 C)   TempSrc:  Oral Oral   SpO2: 97% 94% 99%   Weight:      Height:        Intake/Output Summary (Last 24 hours) at 02/01/2018 0942 Last data filed at 02/01/2018 0547 Gross per 24 hour  Intake 240 ml  Output 600 ml  Net -360 ml   Filed Weights   01/27/18 2333 01/28/18 1454  Weight: 71.7 kg (158 lb) 71.7 kg (158 lb)    Examination: General appearance: Elderly femaly, lying in bed, sleepy, opens eyes then closes themagain, but able to answer questions, able to sit up but requires 2 assistance. HEENT: No change  Skin: Skin warm and dry. Right leg has TED hose, but there is no redness, induration or cord wher I can feel. Cardiac: RRR, no new murmurs, LE edema in both legs, without pitting. Respiratory: Respiratory rate normal. Diminisehd at bases,  but no rales. Abdomen: Abdomen soft, mild discomfort, decreased BS.  No focal tenderness, no guarding, no HSM.   MSK: There is tenderness in the right posterior thigh, not anterior, not knee.  Tenderness in right calf, also tenderness on shin.  No joint effusion.  Neuro: Strength equal in upper and lower extremities. Psych: Very sleepy.  Oriented to [facility] hospital, situation, oreinted to self and son and me.  Affect blunted.     Data Reviewed: I have personally reviewed following labs and imaging studies:  CBC: Recent Labs  Lab 01/28/18 0001 01/28/18 2229 01/29/18 0543 01/30/18 0710 01/31/18 0705  WBC 15.0* 12.6* 10.4 12.8* 8.6  NEUTROABS 11.7*  --   --   --   --   HGB 12.7 11.5* 10.6* 10.2* 9.5*  HCT 39.1 36.7 33.9* 32.8* 30.2*  MCV 88.5 89.5 89.2 89.4 90.4  PLT 248 196 179 181 183   Basic Metabolic Panel: Recent Labs  Lab 01/28/18 0001 01/28/18 2229 01/29/18 0543 01/31/18 0705 02/01/18 0505  NA 140  --  137 139 140  K 3.5  --  4.3 3.7 3.5  CL 108  --  108 109 106  CO2 21*  --  21* 23 24  GLUCOSE 127*  --  124* 87 92  BUN 21*  --  15 18 17   CREATININE 1.31* 1.13* 1.10* 1.20* 1.18*  CALCIUM 9.2  --  8.2* 8.3* 8.5*   GFR: Estimated Creatinine Clearance: 27.1 mL/min (A) (by C-G formula based on SCr of 1.18 mg/dL (H)). Liver Function Tests: Recent Labs  Lab 01/28/18 0001  AST 28  ALT 18  ALKPHOS 72  BILITOT 0.7  PROT 6.9  ALBUMIN 3.4*   No results for input(s): LIPASE, AMYLASE in the last 168 hours. No results for input(s): AMMONIA in the last 168 hours. Coagulation Profile: No results for input(s): INR, PROTIME in the last 168 hours. Cardiac Enzymes: No results for input(s): CKTOTAL, CKMB, CKMBINDEX, TROPONINI in the last 168 hours. BNP (last 3 results) No results for input(s): PROBNP in the last 8760 hours. HbA1C: No results for input(s): HGBA1C in the last 72 hours. CBG: Recent Labs  Lab 01/30/18 2016  GLUCAP 92   Lipid Profile: No results for  input(s): CHOL, HDL, LDLCALC, TRIG, CHOLHDL, LDLDIRECT in the last 72 hours. Thyroid Function Tests: No results for input(s): TSH, T4TOTAL, FREET4, T3FREE, THYROIDAB in the last 72 hours. Anemia Panel: No results for input(s): VITAMINB12, FOLATE, FERRITIN, TIBC, IRON, RETICCTPCT in the last 72 hours. Urine analysis:    Component Value Date/Time   COLORURINE YELLOW 01/29/2018 0559   APPEARANCEUR HAZY (A) 01/29/2018 0559   LABSPEC 1.026 01/29/2018 0559   PHURINE 5.0 01/29/2018 0559   GLUCOSEU NEGATIVE 01/29/2018 0559   HGBUR NEGATIVE 01/29/2018 0559   BILIRUBINUR NEGATIVE 01/29/2018 0559   KETONESUR 5 (A) 01/29/2018 0559   PROTEINUR NEGATIVE 01/29/2018  0559   NITRITE NEGATIVE 01/29/2018 0559   LEUKOCYTESUR MODERATE (A) 01/29/2018 0559   Sepsis Labs: @LABRCNTIP (procalcitonin:4,lacticacidven:4)  ) No results found for this or any previous visit (from the past 240 hour(s)).       Radiology Studies: Dg Chest 2 View  Result Date: 01/31/2018 CLINICAL DATA:  84 year old female postoperative day 3 surgical repair of left hip fracture. Hypoxia. EXAM: CHEST - 2 VIEW COMPARISON:  Chest radiographs 01/27/2018. FINDINGS: AP and lateral views of the chest. New veiling opacity in the lower lobes on the lateral view suggests small pleural effusions. There is superimposed confluent retrocardiac opacity on the left. Extensive Calcified aortic atherosclerosis. Stable cardiomegaly and mediastinal contours. Diffuse increased pulmonary vascularity. No pneumothorax. Osteopenia. Negative visible bowel gas pattern. IMPRESSION: 1. Increased pulmonary vascularity since hospice admission with new small bilateral pleural effusions. Consider pulmonary interstitial edema. Stable cardiomegaly. 2. Superimposed confluent left lower lobe opacity. Aspiration or pneumonia is difficult to exclude. 3.  Aortic Atherosclerosis (ICD10-I70.0). Electronically Signed   By: Odessa Fleming M.D.   On: 01/31/2018 14:23         Scheduled Meds: . brimonidine  1 drop Left Eye TID   And  . dorzolamide  1 drop Left Eye TID  . enoxaparin (LOVENOX) injection  30 mg Subcutaneous Q24H  . ketorolac  15 mg Intravenous Q8H  . latanoprost  1 drop Left Eye QHS  . pantoprazole  40 mg Oral BID AC  . polyethylene glycol  17 g Oral Daily  . senna-docusate  1 tablet Oral BID  . tranexamic acid (CYKLOKAPRON) topical -INTRAOP  2,000 mg Topical Once   Continuous Infusions: . methocarbamol (ROBAXIN)  IV Stopped (01/28/18 0427)     LOS: 4 days    Time spent: 25 minutes    Alberteen Sam, MD [facility] 02/01/2018, 9:15 AM    Pager [phone] --- please page though AMION:  [email or web address] Password [email or web address] If 7PM-7AM, please contact night-coverage

## 2018-02-01 NOTE — Progress Notes (Signed)
RLE venous duplex prelim: negative for DVT.  Litisha Guagliardo Eunice, RDMS, RVT  

## 2018-02-01 NOTE — Care Management Important Message (Signed)
Important Message  Patient Details  Name: Meryle Readygnes L Weidinger MRN: [id number] Date of Birth: [birth year]   Medicare Important Message Given:  Yes    Krissie Merrick Stefan ChurchBratton 02/01/2018, 1:55 PM

## 2018-02-02 DIAGNOSIS — S72002A Fracture of unspecified part of neck of left femur, initial encounter for closed fracture: Principal | ICD-10-CM

## 2018-02-02 LAB — BASIC METABOLIC PANEL
ANION GAP: 9 (ref 5–15)
BUN: 20 mg/dL (ref 6–20)
CALCIUM: 8.5 mg/dL — AB (ref 8.9–10.3)
CO2: 24 mmol/L (ref 22–32)
Chloride: 107 mmol/L (ref 101–111)
Creatinine, Ser: 1.23 mg/dL — ABNORMAL HIGH (ref 0.44–1.00)
GFR, EST AFRICAN AMERICAN: 42 mL/min — AB (ref >60)
GFR, EST NON AFRICAN AMERICAN: 36 mL/min — AB (ref >60)
GLUCOSE: 91 mg/dL (ref 65–99)
POTASSIUM: 3.7 mmol/L (ref 3.5–5.1)
Sodium: 140 mmol/L (ref 135–145)

## 2018-02-02 LAB — CBC
HEMATOCRIT: 29.2 % — AB (ref 36.0–46.0)
Hemoglobin: 9.2 g/dL — ABNORMAL LOW (ref 12.0–15.0)
MCH: 27.9 pg (ref 26.0–34.0)
MCHC: 31.5 g/dL (ref 30.0–36.0)
MCV: 88.5 fL (ref 78.0–100.0)
PLATELETS: 238 10*3/uL (ref 150–400)
RBC: 3.3 MIL/uL — AB (ref 3.87–5.11)
RDW: 16.7 % — AB (ref 11.5–15.5)
WBC: 7.1 10*3/uL (ref 4.0–10.5)

## 2018-02-02 NOTE — Plan of Care (Signed)

## 2018-02-02 NOTE — Social Work (Signed)
[facility].  CSW f/u for disposition.  Keene BreathPatricia Treyvion Durkee, LCSW Clinical Social Worker [phone]

## 2018-02-02 NOTE — Progress Notes (Signed)
Patient discharged to SNF, attempted report x 2 left message no call back. [facility]. Family aware of discharge plan.  Arvie Bartholomew, Kae HellerMiranda Lynn, RN

## 2018-02-02 NOTE — Progress Notes (Signed)
Correction to previous note, son at bedside and pt O2 recovering with deep breathing over 1/2 seconds.  Leaving O2 off with monitor and son will have pt deep breathe if O2 drops below 90. Will continue plan of care.

## 2018-02-02 NOTE — Discharge Summary (Addendum)
Physician Discharge Summary  Donna Summers ZOX:[id number] DOB: [birth year] DOA: 01/27/2018  PCP: [name], MD  Admit date: 01/27/2018 Discharge date: 02/02/2018  Admitted From: SNF Disposition: SNF   Recommendations for Outpatient Follow-up:  1. Follow up with PCP in 1-2 weeks 2. Follow orthopedics recommendations as below  Home Health: N/A Equipment/Devices: Per SNF Discharge Condition: Stable CODE STATUS: DNR Diet recommendation: Heart healthy  Brief/Interim Summary: Donna Summers is a 84 y.o. female with a history of HTN, stage III CKD, gallstones, and GERD with erosive esophagitis on PPI who presented to the ED after a fall at SNF. She reported left hip pain and was found to have left femoral neck fracture, having undergone repair 4/12. She has had a   Discharge Diagnoses:  Principal Problem:   Closed left hip fracture, initial encounter (HCC) Active Problems:   GERD (gastroesophageal reflux disease)   HTN (hypertension)   CKD (chronic kidney disease), stage III (HCC)   Pressure injury of skin  Left hip fracture: s/p DIRECT ANTERIOR APPROACH HEMI HIP ARTHROPLASTY   by Dr. Roda Shutters 4/12.  -Continue acetaminophen, Robaxin and Ketorolac PRN for pain. -Consider osteoporosis screening after discharge if not done previously -Recommend vitamin D 800 IU and calcium 1200 mg supplementation after discharge Per orthopedics: Return in 2 weeks for staple removal. Return in 6 weeks to see MD.  Weight Bearing/Load Lower Extremity: full  Hip precautions: none Suture Removal: 10-14 days  Betadine to incision twice daily once dressing is removed on POD#7 -DVT ppx per Orthopedics: Lovenox 40mg  q24h. Will NEED TO BE READDRESSED AT FOLLOW UP.  Hypoxia: Resolved. Suspected to be related to atelectasis as this resolves without supplemental oxygen when pt breathes deeply/with intention. No hx cardiac or pulmonary disease. Exam benign consistent with atelectasis. PCT negative, not peripherally  volume overloaded.  - Continue to work diligently with PT  - Discussed importance of incentive spirometry with pt and family. Please continue q1h.   Acute delirium: Waxing/waning course, worsened initially post op but now improving.  -Avoid sedating medicines -Encourage PT -Monitor for fever   Leg pain: Resolved on day of discharge. U/S ruled out DVT.   Hypertension:  - Continue medications as below  Hypertensive urgency: Resolved. BP very labile.  Suspect wide pulse pressures reflect hardened arteries, not hypertension.  CKD stage III Baseline creatinine 1.1, stable no change.  ABLA Expected Hgb drop after surgery, stable. Recheck in 1 week.  Glaucoma: Stable - Continue home medications (duplicates cleaned up on MAR)  Pressure ulcer: Stage II - Partial thickness loss of dermis presenting as a shallow open ulcer with a red, pink wound bed without slough. Medial Sacrum  Discharge Instructions Discharge Instructions    Diet - low sodium heart healthy   Complete by:  As directed    Increase activity slowly   Complete by:  As directed    Weight bearing as tolerated   Complete by:  As directed      Allergies as of 02/02/2018      Reactions   Bactrim [sulfamethoxazole-trimethoprim] Other (See Comments)   unknown   Levaquin [levofloxacin] Other (See Comments)   unknown      Medication List    STOP taking these medications   acetaminophen 325 MG tablet Commonly known as:  TYLENOL   brimonidine 0.15 % ophthalmic solution Commonly known as:  ALPHAGAN   BRINZOLAMIDE OP     TAKE these medications   Brimonidine-Dorzolamide 0.15-2 % Soln Place 1 drop into the left  eye 3 (three) times daily.   enoxaparin 40 MG/0.4ML injection Commonly known as:  LOVENOX Inject 0.4 mLs (40 mg total) into the skin daily.   HYDROcodone-acetaminophen 7.5-325 MG tablet Commonly known as:  NORCO Take 1-2 tablets by mouth every 6 (six) hours as needed for moderate pain.    irbesartan 75 MG tablet Commonly known as:  AVAPRO Take 37.5 mg by mouth at bedtime. TAKES 1/2 TABLET   meclizine 12.5 MG tablet Commonly known as:  ANTIVERT Take 12.5 mg by mouth 3 (three) times daily as needed for dizziness.   pantoprazole 40 MG tablet Commonly known as:  PROTONIX Take 1 tablet (40 mg total) by mouth 2 (two) times daily.   polyethylene glycol packet Commonly known as:  MIRALAX / GLYCOLAX Take 17 g by mouth daily.   travoprost (benzalkonium) 0.004 % ophthalmic solution Commonly known as:  TRAVATAN Place 1 drop into the left eye at bedtime.            Discharge Care Instructions  (From admission, onward)        Start     Ordered   01/28/18 0000  Weight bearing as tolerated     01/28/18 1813      Contact information for follow-up providers    Tarry KosXu, Naiping M, MD In 2 weeks.   Specialty:  Orthopedic Surgery Why:  For suture removal, For wound re-check Contact information: [address] [place] [address] [phone]            Contact information for after-discharge care    Destination    [name] .   Service:  Skilled Nursing Contact information: [address] [place] [id number] [phone]                 Allergies  Allergen Reactions  . Bactrim [Sulfamethoxazole-Trimethoprim] Other (See Comments)    unknown  . Levaquin [Levofloxacin] Other (See Comments)    unknown    Consultations:  Orthopedic surgery  Procedures/Studies: Dg Chest 2 View  Result Date: 01/31/2018 CLINICAL DATA:  84 year old female postoperative day 3 surgical repair of left hip fracture. Hypoxia. EXAM: CHEST - 2 VIEW COMPARISON:  Chest radiographs 01/27/2018. FINDINGS: AP and lateral views of the chest. New veiling opacity in the lower lobes on the lateral view suggests small pleural effusions. There is superimposed confluent retrocardiac opacity on the left. Extensive Calcified aortic atherosclerosis.  Stable cardiomegaly and mediastinal contours. Diffuse increased pulmonary vascularity. No pneumothorax. Osteopenia. Negative visible bowel gas pattern. IMPRESSION: 1. Increased pulmonary vascularity since hospice admission with new small bilateral pleural effusions. Consider pulmonary interstitial edema. Stable cardiomegaly. 2. Superimposed confluent left lower lobe opacity. Aspiration or pneumonia is difficult to exclude. 3.  Aortic Atherosclerosis (ICD10-I70.0). Electronically Signed   By: Odessa FlemingH  Hall M.D.   On: 01/31/2018 14:23   Dg Chest 2 View  Result Date: 01/27/2018 CLINICAL DATA:  Preop left femoral neck fracture. EXAM: CHEST - 2 VIEW COMPARISON:  01/13/2018 CXR FINDINGS: Mild cardiomegaly with moderate aortic atherosclerosis. No aneurysm. Central vascular congestion without acute pneumonic consolidation. Surgical anchors project over the left humeral head. IMPRESSION: Mild cardiomegaly without acute pulmonary disease. Aortic atherosclerosis. Electronically Signed   By: Tollie Ethavid  Kwon M.D.   On: 01/27/2018 23:34   Dg Chest 2 View  Result Date: 01/13/2018 CLINICAL DATA:  History of constipation.  Weakness. EXAM: CHEST - 2 VIEW COMPARISON:  01/06/2018 FINDINGS: There is bilateral mild chronic interstitial thickening. There is no focal consolidation. There is no  pleural effusion or pneumothorax. The heart size is top-normal. There is thoracic aortic atherosclerosis. There are multiple old healed right posterior rib fractures. IMPRESSION: No active cardiopulmonary disease. Electronically Signed   By: Elige Ko   On: 01/13/2018 13:44   Ct Head Wo Contrast  Result Date: 01/27/2018 CLINICAL DATA:  Fall with hip injury EXAM: CT HEAD WITHOUT CONTRAST TECHNIQUE: Contiguous axial images were obtained from the base of the skull through the vertex without intravenous contrast. COMPARISON:  Radiograph 01/27/2018 FINDINGS: Brain: No acute territorial infarction, hemorrhage or intracranial mass is visualized.  Moderate atrophy. Mild small vessel ischemic changes of the white matter. Ventricle size within normal limits Vascular: No hyperdense vessels.  Carotid vascular calcification. Skull: Normal. Negative for fracture or focal lesion. Sinuses/Orbits: Mild mucosal thickening in the maxillary and ethmoid sinuses. No acute orbital abnormality. Other: None. IMPRESSION: 1. No CT evidence for acute intracranial abnormality. 2. Atrophy with small vessel ischemic changes of the white matter Electronically Signed   By: Jasmine Pang M.D.   On: 01/27/2018 23:41   US Abdomen Complete  Result Date: 01/06/2018 CLINICAL DATA:  Right upper quadrant abdominal pain for the past week. Nausea. EXAM: ABDOMEN ULTRASOUND COMPLETE COMPARISON:  None. FINDINGS: Gallbladder: Multiple gallstones in the gallbladder, the largest measuring 1.1 cm in maximum diameter. Mild diffuse gallbladder wall thickening with a maximum thickness of 3.4 mm. No pericholecystic fluid. No sonographic Murphy sign. Common bile duct: Diameter: 6.9 mm Liver: No focal lesion identified. Within normal limits in parenchymal echogenicity. Portal vein is patent on color Doppler imaging with normal direction of blood flow towards the liver. IVC: No abnormality visualized. Pancreas: Visualized portion unremarkable. Spleen: Size and appearance within normal limits. Right Kidney: Length: 8.7 cm. Diffuse cortical thinning. Prominent renal sinus fat. Normal echotexture. No hydronephrosis. Left Kidney: Length: 8.6 cm. Diffuse cortical thinning. Prominent renal sinus fat. Normal echotexture. No hydronephrosis. Abdominal aorta: No aneurysm visualized. Other findings: None. IMPRESSION: 1. Cholelithiasis. 2. Mild diffuse gallbladder wall thickening. This is most likely due to chronic cholecystitis. 3. No biliary ductal dilatation. 4. Diffuse bilateral renal cortical atrophy. Electronically Signed   By: Beckie Salts M.D.   On: 01/06/2018 12:22   Ct Abdomen Pelvis W  Contrast  Result Date: 01/13/2018 CLINICAL DATA:  Recently released from the hospital 5 days ago, has not had a bowel movement in 5 days since being released despite MiraLax, history hypertension, GERD EXAM: CT ABDOMEN AND PELVIS WITH CONTRAST TECHNIQUE: Multidetector CT imaging of the abdomen and pelvis was performed using the standard protocol following bolus administration of intravenous contrast. Sagittal and coronal MPR images reconstructed from axial data set. CONTRAST:  80mL ISOVUE-300 IOPAMIDOL (ISOVUE-300) INJECTION 61% IV. No oral contrast. COMPARISON:  None; correlation abdominal ultrasound 01/06/2018 FINDINGS: Lower chest: Bibasilar subpleural interstitial changes and minimal basilar atelectasis. Tiny LEFT pleural effusion. Hepatobiliary: Cholelithiasis. Minimal gallbladder wall thickening. No biliary dilatation. Liver unremarkable. Pancreas: Atrophic.  Partial fatty replacement at pancreatic head. Spleen: Normal appearance Adrenals/Urinary Tract: Adrenal glands normal appearance. Tiny RIGHT renal cyst. Kidneys, ureters, and bladder normal appearance Stomach/Bowel: Mildly increased stool throughout colon. Appendix not visualized but no pericecal inflammatory process seen. Moderate to large hiatal hernia. Stomach and bowel loops otherwise unremarkable. Vascular/Lymphatic: Extensive atherosclerotic calcifications of, iliac arteries and coronary arteries. Aorta normal caliber. No adenopathy. Reproductive: Uterus surgically absent. Nonvisualization of ovaries. Other: No free air or free fluid.  No hernia. Musculoskeletal: Osseous structures unremarkable. IMPRESSION: Cholelithiasis with minimal gallbladder wall thickening unchanged from prior ultrasound, may reflect  chronic cholecystitis. Hiatal hernia. Extensive atherosclerotic disease changes including coronary arteries. Mildly increased stool throughout colon. Minimal LEFT pleural effusion, LEFT basilar atelectasis and BILATERAL basilar subpleural  interstitial changes. Electronically Signed   By: Ulyses Southward M.D.   On: 01/13/2018 15:10   Dg Esophagus  Result Date: 01/20/2018 CLINICAL DATA:  84 year old female with dysphagia nausea and vomiting. EXAM: ESOPHOGRAM/BARIUM SWALLOW TECHNIQUE: Single contrast examination was performed using thin barium and a 12.5 millimeter barium tablet. FLUOROSCOPY TIME:  Fluoroscopy Time:  2 minutes 6 seconds Radiation Exposure Index (if provided by the fluoroscopic device): 27.9 mGy Number of Acquired Spot Images: 0 COMPARISON:  CT Abdomen and Pelvis 01/13/2018 FINDINGS: A single contrast study was undertaken with head of the fluoroscopy table inclined at 30 degrees, and the patient tolerated this well and without difficulty. No obstruction to the forward flow of contrast throughout the esophagus and into the stomach. Decreased esophageal motility, but no tertiary contractions occurred. Normal esophageal course and contour. Prompt transit of contrast into the intraabdominal stomach (series 1), however, a small hiatal hernia became apparent over the course of the study. A persistently narrow it appearance of the proximal gastric lumen at the level of the esophageal hiatus was noted throughout the exam (series 3, image 57 and also series 14, image 50), but appears to be benign and non malignant. Similar benign compression of gastric cardia noted on the recent CT Abdomen and Pelvis. This narrowing did not allow passage of A 12.5 mm barium tablet, which otherwise passed freely to the distal esophagus and into the small hiatal hernia without delay. A small volume of contrast was maintained in the distal esophagus and hiatal hernia. Prompt gastric emptying was noted. At the conclusion of the study contrast had reached the proximal jejunum. Incidental 2 centimeter diverticulum of the distal duodenum (series 19). IMPRESSION: 1. 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 2. Small gastric hiatal hernia which results in extrinsic mass effect  and narrowing on the lumen of the stomach through the esophageal hiatus. This appearance also visible on the recent CT Abdomen and Pelvis. This narrowing did not permit passage of a 12.5 millimeter barium tablet during the exam - which otherwise passed freely through the esophagus. However, transit of barium was only mildly delayed. 3. Prompt gastric emptying.  Incidental duodenal diverticulum. Electronically Signed   By: Odessa Fleming M.D.   On: 01/20/2018 15:25   Pelvis Portable  Result Date: 01/28/2018 CLINICAL DATA:  84 y/o  F; left hip hemiarthroplasty postop. EXAM: PORTABLE PELVIS 1-2 VIEWS COMPARISON:  01/27/2018 pelvis radiograph. FINDINGS: Left hip hemiarthroplasty. No periprosthetic lucency or fracture. Air and edema is present within the surrounding soft tissues compatible with postsurgical change. Vascular calcifications noted. IMPRESSION: Left hip hemiarthroplasty and postsurgical changes. No apparent hardware related complication. Electronically Signed   By: Mitzi Hansen M.D.   On: 01/28/2018 18:55   US Abdomen Limited  Result Date: 01/19/2018 CLINICAL DATA:  Abdominal pain and vomiting EXAM: ULTRASOUND ABDOMEN LIMITED RIGHT UPPER QUADRANT COMPARISON:  01/13/2018 FINDINGS: Gallbladder: Cholelithiasis is again identified. Mild gallbladder wall thickening to 3.5 mm is noted. Mild tenderness is noted over the right upper quadrant. Common bile duct: Diameter: 7.2 mm. This is within normal limits for the patient's given age. Liver: No focal lesion identified. Within normal limits in parenchymal echogenicity. Portal vein is patent on color Doppler imaging with normal direction of blood flow towards the liver. Small right pleural effusion is noted. IMPRESSION: Cholelithiasis with gallbladder wall thickening. Mild right upper quadrant  tenderness is noted. Electronically Signed   By: Alcide Clever M.D.   On: 01/19/2018 14:02   Dg Abdomen Acute W/chest  Result Date: 01/06/2018 CLINICAL DATA:   Abdominal pain.  Gallstones. EXAM: DG ABDOMEN ACUTE W/ 1V CHEST COMPARISON:  None. FINDINGS: There is no evidence of dilated bowel loops or free intraperitoneal air. No radiopaque calculi or other significant radiographic abnormality is seen. Heart size and mediastinal contours are within normal limits. Both lungs are clear. Vascular calcification of the aorta. IMPRESSION: Negative abdominal radiographs.  No acute cardiopulmonary disease. Electronically Signed   By: Elsie Stain M.D.   On: 01/06/2018 13:20   Dg C-arm 1-60 Min  Result Date: 01/28/2018 CLINICAL DATA:  Left hip arthroplasty EXAM: OPERATIVE left HIP (WITH PELVIS IF PERFORMED) 3 VIEWS TECHNIQUE: Fluoroscopic spot image(s) were submitted for interpretation post-operatively. COMPARISON:  Radiograph 01/27/2018 FINDINGS: Three low resolution intraoperative spot views of the left hip. The images demonstrate a left femoral neck fracture with subsequent hip replacement and normal alignment. IMPRESSION: Intraoperative fluoroscopic assistance provided during surgical fixation of left hip fracture Electronically Signed   By: Jasmine Pang M.D.   On: 01/28/2018 18:23   Dg C-arm 1-60 Min  Result Date: 01/28/2018 CLINICAL DATA:  Left hip arthroplasty EXAM: OPERATIVE left HIP (WITH PELVIS IF PERFORMED) 3 VIEWS TECHNIQUE: Fluoroscopic spot image(s) were submitted for interpretation post-operatively. COMPARISON:  Radiograph 01/27/2018 FINDINGS: Three low resolution intraoperative spot views of the left hip. The images demonstrate a left femoral neck fracture with subsequent hip replacement and normal alignment. IMPRESSION: Intraoperative fluoroscopic assistance provided during surgical fixation of left hip fracture Electronically Signed   By: Jasmine Pang M.D.   On: 01/28/2018 18:23   Dg Hip Operative Unilat W Or W/o Pelvis Left  Result Date: 01/28/2018 CLINICAL DATA:  Left hip arthroplasty EXAM: OPERATIVE left HIP (WITH PELVIS IF PERFORMED) 3 VIEWS  TECHNIQUE: Fluoroscopic spot image(s) were submitted for interpretation post-operatively. COMPARISON:  Radiograph 01/27/2018 FINDINGS: Three low resolution intraoperative spot views of the left hip. The images demonstrate a left femoral neck fracture with subsequent hip replacement and normal alignment. IMPRESSION: Intraoperative fluoroscopic assistance provided during surgical fixation of left hip fracture Electronically Signed   By: Jasmine Pang M.D.   On: 01/28/2018 18:23   Dg Hip Unilat With Pelvis 2-3 Views Left  Result Date: 01/27/2018 CLINICAL DATA:  Fall with pain to the left hip EXAM: DG HIP (WITH OR WITHOUT PELVIS) 2-3V LEFT COMPARISON:  CT 01/13/2018 FINDINGS: Residual contrast material within the colon. Pubic symphysis and rami are intact. The right femoral head projects in joint. Acute left femoral neck fracture with superior displacement of the trochanter. Left femoral head projects in joint. IMPRESSION: Acute displaced left femoral neck fracture Electronically Signed   By: Jasmine Pang M.D.   On: 01/27/2018 23:31    Subjective: Feels well, pain is controlled. Mentation is improved from last couple days per Son. Walked with PT today and is tired from that. No oxygen requirement.   Discharge Exam: Vitals:   02/01/18 2218 02/02/18 0434  BP: (!) 160/53 (!) 164/64  Pulse: 66 74  Resp: 16 16  Temp: 98.7 F (37.1 C) 98.6 F (37 C)  SpO2: 98% 90%   General: Pt is alert, awake, not in acute distress Cardiovascular: RRR, S1/S2 +, no rubs, no gallops Respiratory: Nonlabored, bibasilar crackles clearing with several deep inspirations. SpO2 92% at rest/sleep, up to 98% with deep breathing. Abdominal: Soft, NT, ND, bowel sounds + Extremities: LLE neurovascularly  intact.   Labs: Basic Metabolic Panel: Recent Labs  Lab 01/28/18 0001 01/28/18 2229 01/29/18 0543 01/31/18 0705 02/01/18 0505 02/02/18 0534  NA 140  --  137 139 140 140  K 3.5  --  4.3 3.7 3.5 3.7  CL 108  --  108  109 106 107  CO2 21*  --  21* 23 24 24   GLUCOSE 127*  --  124* 87 92 91  BUN 21*  --  15 18 17 20   CREATININE 1.31* 1.13* 1.10* 1.20* 1.18* 1.23*  CALCIUM 9.2  --  8.2* 8.3* 8.5* 8.5*   Liver Function Tests: Recent Labs  Lab 01/28/18 0001  AST 28  ALT 18  ALKPHOS 72  BILITOT 0.7  PROT 6.9  ALBUMIN 3.4*   CBC: Recent Labs  Lab 01/28/18 0001 01/28/18 2229 01/29/18 0543 01/30/18 0710 01/31/18 0705 02/02/18 0534  WBC 15.0* 12.6* 10.4 12.8* 8.6 7.1  NEUTROABS 11.7*  --   --   --   --   --   HGB 12.7 11.5* 10.6* 10.2* 9.5* 9.2*  HCT 39.1 36.7 33.9* 32.8* 30.2* 29.2*  MCV 88.5 89.5 89.2 89.4 90.4 88.5  PLT 248 196 179 181 183 238   Urinalysis    Component Value Date/Time   COLORURINE YELLOW 01/29/2018 0559   APPEARANCEUR HAZY (A) 01/29/2018 0559   LABSPEC 1.026 01/29/2018 0559   PHURINE 5.0 01/29/2018 0559   GLUCOSEU NEGATIVE 01/29/2018 0559   HGBUR NEGATIVE 01/29/2018 0559   BILIRUBINUR NEGATIVE 01/29/2018 0559   KETONESUR 5 (A) 01/29/2018 0559   PROTEINUR NEGATIVE 01/29/2018 0559   NITRITE NEGATIVE 01/29/2018 0559   LEUKOCYTESUR MODERATE (A) 01/29/2018 0559    Time coordinating discharge: 40 minutes  Hazeline Junker, MD  [facility] 02/02/2018, 12:15 PM Pager [phone]

## 2018-02-02 NOTE — Progress Notes (Signed)
Pt SAT 90-93 on RA. Pt resting in bed with call bell in reach, side rails up x 3. Family at bedside.

## 2018-02-02 NOTE — Discharge Planning (Signed)
Called [name] to give report and had to leave a message.

## 2018-02-02 NOTE — Social Work (Signed)
Clinical Social Worker facilitated patient discharge including contacting patient family and facility to confirm patient discharge plans.  Clinical information faxed to facility and family agreeable with plan.    CS[facility].    RN to call [phone] to give report prior to discharge. Pt going to Room 307P.  Clinical Social Worker will sign off for now as social work intervention is no longer needed. Please consult us again if new need arises.  Keene BreathPatricia Muneer Leider, LCSW Clinical Social Worker [phone]

## 2018-02-02 NOTE — Clinical Social Work Placement (Signed)
   CLINICAL SOCIAL WORK PLACEMENT  NOTE  Date:  02/02/2018  Patient Details  Name: Donna Summers MRN: [id number] Date of Birth: August 04, 1920  Clinical Social Work is seeking post-discharge placement for this patient at the Skilled  Nursing Facility level of care (*CSW will initial, date and re-position this form in  chart as items are completed):      Patient/family provided with [facility] Clinical Social Work Department's list of facilities offering this level of care within the geographic area requested by the patient (or if unable, by the patient's family).  Yes   Patient/family informed of their freedom to choose among providers that offer the needed level of care, that participate in Medicare, Medicaid or managed care program needed by the patient, have an available bed and are willing to accept the patient.      Patient/family informed of [facility]'s ownership interest in [facility] and [facility], as well as of the fact that they are under no obligation to receive care at these facilities.  PASRR submitted to EDS on       PASRR number received on       Existing PASRR number confirmed on 01/29/18     FL2 transmitted to all facilities in geographic area requested by pt/family on 01/29/18     FL2 transmitted to all facilities within larger geographic area on 01/29/18     Patient informed that his/her managed care company has contracts with or will negotiate with certain facilities, including the following:        Yes   Patient/family informed of bed offers received.  [facility]     Physician recommends and patient chooses bed at      [facility]  Patient to be transferred to facility by [facility]     Patient family notified on 01/31/18 of transfer.  Name of family member notified:  [name] at bedside     PHYSICIAN       Additional Comment:     _______________________________________________ Tresa MoorePatricia V Petronella Shuford, LCSW 02/02/2018, 12:20 PM

## 2018-02-02 NOTE — Progress Notes (Signed)
Physical Therapy Treatment Patient Details Name: Donna Readygnes L Abarca MRN: [id number] DOB: [birth year] Today's Date: 02/02/2018    History of Present Illness Donna Summers is a 84 y.o. female who presents with left hip fracture s/p mechanical fall PTA at her SNF.  She was recently discharged from hospital for esophagitis and discharged to [facility].  The patient endorses severe pain in the left hip, that does not radiate, grinding in quality, worse with any movement, better with immobilization.  Denies LOC/fever/chills/nausea/vomiting.  Walks with walker at baseline.  Denies LOC, neck pain, abd pain. now s/p L hip hemiarthoplasty.    PT Comments    Pt performed gait and functional mobility this session.  She is progressing well as she was unable to advance steps from bed to chair during yesterday's session.  Pt continues to benefit from skilled rehab to improve strength and function before returning home.  Son present and please with patient progress.      Follow Up Recommendations  SNF;Supervision/Assistance - 24 hour     Equipment Recommendations  None recommended by PT    Recommendations for Other Services       Precautions / Restrictions Precautions Precautions: Fall Precaution Comments: Anterior approach. No hip precautions per MD note on 01/28/18. Restrictions Weight Bearing Restrictions: Yes LLE Weight Bearing: Weight bearing as tolerated    Mobility  Bed Mobility Overal bed mobility: Needs Assistance Bed Mobility: Supine to Sit     Supine to sit: Max assist;+2 for safety/equipment     General bed mobility comments: Pt required assistance to elevate trunk into sitting and to advance hips to edge of bed.  Assistance also provided for LLE advancement due to pain.    Transfers Overall transfer level: Needs assistance Equipment used: Rolling walker (2 wheeled) Transfers: Sit to/from Stand Sit to Stand: Mod assist;+2 physical assistance         General transfer  comment: Cues for hand placement to achieve standing.  Hand over hand technique to reach back for seated surface.    Ambulation/Gait Ambulation/Gait assistance: Mod assist Ambulation Distance (Feet): 30 Feet Assistive device: Rolling walker (2 wheeled) Gait Pattern/deviations: Step-through pattern;Decreased stride length;Trunk flexed;Narrow base of support Gait velocity: decreased   General Gait Details: Pt required cues for forward gaze, upper trunk control and keeping her eyes open.  Pt required close chair follow as she fatigues quickly.  93%-95% on RA.     Stairs             Wheelchair Mobility    Modified Rankin (Stroke Patients Only)       Balance Overall balance assessment: Needs assistance   Sitting balance-Leahy Scale: Fair       Standing balance-Leahy Scale: Poor                              Cognition Arousal/Alertness: Awake/alert Behavior During Therapy: WFL for tasks assessed/performed Overall Cognitive Status: Within Functional Limits for tasks assessed Area of Impairment: Memory                     Memory: Decreased short-term memory         General Comments: decreased STM noted during session, pt stating she ate lunch however lunch tray was not touched at start of session      Exercises      General Comments        Pertinent Vitals/Pain Pain Assessment: 0-10 Pain Score:  6  Pain Location: L hip Pain Descriptors / Indicators: Discomfort;Grimacing Pain Intervention(s): Monitored during session;Premedicated before session    Home Living                      Prior Function            PT Goals (current goals can now be found in the care plan section) Acute Rehab PT Goals Patient Stated Goal: Go to rehab Potential to Achieve Goals: Good Progress towards PT goals: Progressing toward goals    Frequency    Min 3X/week      PT Plan Current plan remains appropriate    Co-evaluation               AM-PAC PT "6 Clicks" Daily Activity  Outcome Measure  Difficulty turning over in bed (including adjusting bedclothes, sheets and blankets)?: Unable Difficulty moving from lying on back to sitting on the side of the bed? : Unable Difficulty sitting down on and standing up from a chair with arms (e.g., wheelchair, bedside commode, etc,.)?: Unable Help needed moving to and from a bed to chair (including a wheelchair)?: A Lot Help needed walking in hospital room?: A Lot Help needed climbing 3-5 steps with a railing? : Total 6 Click Score: 8    End of Session Equipment Utilized During Treatment: Gait belt Activity Tolerance: Patient tolerated treatment well Patient left: with call bell/phone within reach;with family/visitor present;in chair Nurse Communication: Mobility status PT Visit Diagnosis: Unsteadiness on feet (R26.81)     Time: 9604-5409 PT Time Calculation (min) (ACUTE ONLY): 21 min  Charges:  $Gait Training: 8-22 mins                    G Codes:       Joycelyn Rua, PTA pager [phone]    Florestine Avers 02/02/2018, 2:30 PM

## 2018-02-02 NOTE — Progress Notes (Signed)
SAT dropped to upper 80s replaced O2 at 1L via n/c.

## 2018-02-11 ENCOUNTER — Ambulatory Visit (INDEPENDENT_AMBULATORY_CARE_PROVIDER_SITE_OTHER): Payer: No Typology Code available for payment source | Admitting: Physician Assistant

## 2018-02-11 ENCOUNTER — Encounter (INDEPENDENT_AMBULATORY_CARE_PROVIDER_SITE_OTHER): Payer: Self-pay | Admitting: Physician Assistant

## 2018-02-11 ENCOUNTER — Ambulatory Visit (INDEPENDENT_AMBULATORY_CARE_PROVIDER_SITE_OTHER): Payer: No Typology Code available for payment source

## 2018-02-11 VITALS — Ht 63.0 in | Wt 158.0 lb

## 2018-02-11 DIAGNOSIS — Z96642 Presence of left artificial hip joint: Secondary | ICD-10-CM | POA: Diagnosis not present

## 2018-02-11 NOTE — Progress Notes (Signed)
Post-Op Visit Note   Patient: Donna Summers           Date of Birth: 1920-08-24           MRN: [id number] Visit Date: 02/11/2018 PCP: Wanda PlumpPaz, Jose E, MD   Assessment & Plan:  Chief Complaint:  Chief Complaint  Patient presents with  . Left Hip - Routine Post Op    Left hip hemi 01/28/18   Visit Diagnoses:  1. History of left hip hemiarthroplasty     Plan: Patient is a 84 year old female presents to our clinic today 14 days status post left anterior hip hemiarthroplasty, date of surgery 01/28/2018.  [facility].  No fevers, chills or any other systemic symptoms.  She has been working with PT and has been ambulating majority of the time with a walker.  This is her baseline.  Examination of the left hip reveals well-healing surgical incision with nylon sutures in place.  No evidence of infection or cellulitis..  At this time we will remove her sutures.  Will apply Steri-Strips.  [facility] she is discharged back to her assisted living facility.  She will follow-up with us in 4 weeks time for repeat evaluation x-ray.  Call if concerns or questions in the meantime.  Follow-Up Instructions: Return in about 1 month (around 03/11/2018).   Orders:  No orders of the defined types were placed in this encounter.  No orders of the defined types were placed in this encounter.   Imaging: No results found.  PMFS History: Patient Active Problem List   Diagnosis Date Noted  . History of left hip hemiarthroplasty 02/11/2018  . Pressure injury of skin 02/01/2018  . Nausea and vomiting in adult   . Closed left hip fracture, initial encounter (HCC) 01/28/2018  . CKD (chronic kidney disease), stage III (HCC) 01/28/2018  . Hypertensive urgency   . Dysphagia 01/20/2018  . Acute lower UTI 01/20/2018  . Dehydration 01/20/2018  . Weakness generalized 01/20/2018  . Symptomatic cholelithiasis 01/19/2018  . Cholelithiasis  01/19/2018  . Cholecystitis 01/06/2018  . Constipation 01/06/2018  . History of urinary retention 01/06/2018  . Abdominal pain 01/06/2018  . Vitamin D deficiency 01/06/2018  . HLD (hyperlipidemia) 01/06/2018  . GERD (gastroesophageal reflux disease) 01/06/2018  . HTN (hypertension) 01/06/2018   Past Medical History:  Diagnosis Date  . Constipation   . Constipation   . GERD (gastroesophageal reflux disease)   . Glaucoma   . History of carotid artery disease   . Hyperlipidemia   . Hypertension   . UTI (urinary tract infection)   . Vertigo     History reviewed. No pertinent family history.  Past Surgical History:  Procedure Laterality Date  . ANTERIOR APPROACH HEMI HIP ARTHROPLASTY Left 01/28/2018   Procedure: DIRECT ANTERIOR APPROACH HEMI HIP ARTHROPLASTY;  Surgeon: Tarry KosXu, Naiping M, MD;  Location: [facility] OR;  Service: Orthopedics;  Laterality: Left;  . ESOPHAGOGASTRODUODENOSCOPY (EGD) WITH PROPOFOL N/A 01/22/2018   Procedure: ESOPHAGOGASTRODUODENOSCOPY (EGD) WITH PROPOFOL;  Surgeon: Graylin ShiverGanem, Salem F, MD;  Location: [facility] ENDOSCOPY;  Service: Endoscopy;  Laterality: N/A;  . UNKNOWN SURGICAL HISTORY     Social History   Occupational History  . Not on file  Tobacco Use  . Smoking status: Never Smoker  . Smokeless tobacco: Never Used  Substance and Sexual Activity  . Alcohol use: Not Currently    Frequency: Never  . Drug use: Never  .  Sexual activity: Not Currently

## 2018-02-25 ENCOUNTER — Telehealth (INDEPENDENT_AMBULATORY_CARE_PROVIDER_SITE_OTHER): Payer: Self-pay | Admitting: Orthopaedic Surgery

## 2018-02-25 NOTE — Telephone Encounter (Signed)
Called to let [facility] know.

## 2018-02-25 NOTE — Telephone Encounter (Signed)
Please advise 

## 2018-02-25 NOTE — Telephone Encounter (Signed)
Patient's son Moshe Cipro called advise patient is going to be released from [place] today but have a F/U appointment with Dr Roda Shutters 03/15/18. [name] asked if Dr Roda Shutters still need to see her. The number to contact Moshe Cipro is [phone]

## 2018-02-25 NOTE — Telephone Encounter (Signed)
Yes definitely needs f/u appt.

## 2018-03-07 ENCOUNTER — Emergency Department (HOSPITAL_COMMUNITY): Payer: Medicare Other

## 2018-03-07 ENCOUNTER — Inpatient Hospital Stay (HOSPITAL_COMMUNITY)
Admission: EM | Admit: 2018-03-07 | Discharge: 2018-03-10 | DRG: 690 | Disposition: A | Discharge status: Skilled Nursing Facility | Payer: Medicare Other | Attending: Internal Medicine | Admitting: Internal Medicine

## 2018-03-07 ENCOUNTER — Encounter (HOSPITAL_COMMUNITY): Payer: Self-pay | Admitting: *Deleted

## 2018-03-07 ENCOUNTER — Other Ambulatory Visit: Payer: Self-pay

## 2018-03-07 DIAGNOSIS — R5081 Fever presenting with conditions classified elsewhere: Secondary | ICD-10-CM | POA: Diagnosis not present

## 2018-03-07 DIAGNOSIS — Z515 Encounter for palliative care: Secondary | ICD-10-CM | POA: Diagnosis present

## 2018-03-07 DIAGNOSIS — K219 Gastro-esophageal reflux disease without esophagitis: Secondary | ICD-10-CM | POA: Diagnosis present

## 2018-03-07 DIAGNOSIS — S32512A Fracture of superior rim of left pubis, initial encounter for closed fracture: Secondary | ICD-10-CM

## 2018-03-07 DIAGNOSIS — R319 Hematuria, unspecified: Secondary | ICD-10-CM | POA: Diagnosis present

## 2018-03-07 DIAGNOSIS — N183 Chronic kidney disease, stage 3 unspecified: Secondary | ICD-10-CM | POA: Diagnosis present

## 2018-03-07 DIAGNOSIS — S62101A Fracture of unspecified carpal bone, right wrist, initial encounter for closed fracture: Secondary | ICD-10-CM

## 2018-03-07 DIAGNOSIS — Z6826 Body mass index (BMI) 26.0-26.9, adult: Secondary | ICD-10-CM

## 2018-03-07 DIAGNOSIS — R52 Pain, unspecified: Secondary | ICD-10-CM

## 2018-03-07 DIAGNOSIS — Z79899 Other long term (current) drug therapy: Secondary | ICD-10-CM

## 2018-03-07 DIAGNOSIS — E440 Moderate protein-calorie malnutrition: Secondary | ICD-10-CM | POA: Diagnosis present

## 2018-03-07 DIAGNOSIS — S32592A Other specified fracture of left pubis, initial encounter for closed fracture: Secondary | ICD-10-CM | POA: Diagnosis present

## 2018-03-07 DIAGNOSIS — I639 Cerebral infarction, unspecified: Secondary | ICD-10-CM

## 2018-03-07 DIAGNOSIS — W19XXXA Unspecified fall, initial encounter: Secondary | ICD-10-CM | POA: Diagnosis present

## 2018-03-07 DIAGNOSIS — Z882 Allergy status to sulfonamides status: Secondary | ICD-10-CM

## 2018-03-07 DIAGNOSIS — Z96642 Presence of left artificial hip joint: Secondary | ICD-10-CM | POA: Diagnosis present

## 2018-03-07 DIAGNOSIS — Z87898 Personal history of other specified conditions: Secondary | ICD-10-CM

## 2018-03-07 DIAGNOSIS — H409 Unspecified glaucoma: Secondary | ICD-10-CM | POA: Diagnosis present

## 2018-03-07 DIAGNOSIS — N390 Urinary tract infection, site not specified: Secondary | ICD-10-CM | POA: Diagnosis not present

## 2018-03-07 DIAGNOSIS — E559 Vitamin D deficiency, unspecified: Secondary | ICD-10-CM | POA: Diagnosis present

## 2018-03-07 DIAGNOSIS — I10 Essential (primary) hypertension: Secondary | ICD-10-CM | POA: Diagnosis present

## 2018-03-07 DIAGNOSIS — B965 Pseudomonas (aeruginosa) (mallei) (pseudomallei) as the cause of diseases classified elsewhere: Secondary | ICD-10-CM | POA: Diagnosis present

## 2018-03-07 DIAGNOSIS — R296 Repeated falls: Secondary | ICD-10-CM | POA: Diagnosis present

## 2018-03-07 DIAGNOSIS — Z7189 Other specified counseling: Secondary | ICD-10-CM

## 2018-03-07 DIAGNOSIS — Z87440 Personal history of urinary (tract) infections: Secondary | ICD-10-CM

## 2018-03-07 DIAGNOSIS — B999 Unspecified infectious disease: Secondary | ICD-10-CM

## 2018-03-07 DIAGNOSIS — B961 Klebsiella pneumoniae [K. pneumoniae] as the cause of diseases classified elsewhere: Secondary | ICD-10-CM | POA: Diagnosis present

## 2018-03-07 DIAGNOSIS — Z66 Do not resuscitate: Secondary | ICD-10-CM | POA: Diagnosis present

## 2018-03-07 DIAGNOSIS — Z881 Allergy status to other antibiotic agents status: Secondary | ICD-10-CM

## 2018-03-07 DIAGNOSIS — R0602 Shortness of breath: Secondary | ICD-10-CM

## 2018-03-07 DIAGNOSIS — F0390 Unspecified dementia without behavioral disturbance: Secondary | ICD-10-CM | POA: Diagnosis present

## 2018-03-07 DIAGNOSIS — E785 Hyperlipidemia, unspecified: Secondary | ICD-10-CM | POA: Diagnosis present

## 2018-03-07 DIAGNOSIS — I129 Hypertensive chronic kidney disease with stage 1 through stage 4 chronic kidney disease, or unspecified chronic kidney disease: Secondary | ICD-10-CM | POA: Diagnosis present

## 2018-03-07 HISTORY — DX: Cerebral infarction, unspecified: I63.9

## 2018-03-07 LAB — COMPREHENSIVE METABOLIC PANEL
ALT: 12 U/L — AB (ref 14–54)
AST: 23 U/L (ref 15–41)
Albumin: 3.2 g/dL — ABNORMAL LOW (ref 3.5–5.0)
Alkaline Phosphatase: 88 U/L (ref 38–126)
Anion gap: 11 (ref 5–15)
BILIRUBIN TOTAL: 0.8 mg/dL (ref 0.3–1.2)
BUN: 13 mg/dL (ref 6–20)
CO2: 23 mmol/L (ref 22–32)
CREATININE: 1.2 mg/dL — AB (ref 0.44–1.00)
Calcium: 9.4 mg/dL (ref 8.9–10.3)
Chloride: 106 mmol/L (ref 101–111)
GFR, EST AFRICAN AMERICAN: 43 mL/min — AB (ref >60)
GFR, EST NON AFRICAN AMERICAN: 37 mL/min — AB (ref >60)
Glucose, Bld: 94 mg/dL (ref 65–99)
POTASSIUM: 3.9 mmol/L (ref 3.5–5.1)
Sodium: 140 mmol/L (ref 135–145)
TOTAL PROTEIN: 6.4 g/dL — AB (ref 6.5–8.1)

## 2018-03-07 LAB — URINALYSIS, ROUTINE W REFLEX MICROSCOPIC
BILIRUBIN URINE: NEGATIVE
GLUCOSE, UA: NEGATIVE mg/dL
Ketones, ur: NEGATIVE mg/dL
Nitrite: NEGATIVE
PH: 6 (ref 5.0–8.0)
Protein, ur: NEGATIVE mg/dL
SPECIFIC GRAVITY, URINE: 1.01 (ref 1.005–1.030)

## 2018-03-07 LAB — CBC WITH DIFFERENTIAL/PLATELET
Abs Immature Granulocytes: 0.3 10*3/uL — ABNORMAL HIGH (ref 0.0–0.1)
BASOS PCT: 0 %
Basophils Absolute: 0 10*3/uL (ref 0.0–0.1)
EOS ABS: 0.1 10*3/uL (ref 0.0–0.7)
Eosinophils Relative: 1 %
HCT: 37.6 % (ref 36.0–46.0)
Hemoglobin: 11.7 g/dL — ABNORMAL LOW (ref 12.0–15.0)
IMMATURE GRANULOCYTES: 2 %
Lymphocytes Relative: 11 %
Lymphs Abs: 1.5 10*3/uL (ref 0.7–4.0)
MCH: 26.4 pg (ref 26.0–34.0)
MCHC: 31.1 g/dL (ref 30.0–36.0)
MCV: 84.7 fL (ref 78.0–100.0)
MONO ABS: 1 10*3/uL (ref 0.1–1.0)
MONOS PCT: 7 %
NEUTROS PCT: 79 %
Neutro Abs: 11.4 10*3/uL — ABNORMAL HIGH (ref 1.7–7.7)
Platelets: 314 10*3/uL (ref 150–400)
RBC: 4.44 MIL/uL (ref 3.87–5.11)
RDW: 15.2 % (ref 11.5–15.5)
WBC: 14.2 10*3/uL — ABNORMAL HIGH (ref 4.0–10.5)

## 2018-03-07 LAB — I-STAT CG4 LACTIC ACID, ED: LACTIC ACID, VENOUS: 1.1 mmol/L (ref 0.5–1.9)

## 2018-03-07 MED ORDER — IRBESARTAN 75 MG PO TABS
37.5000 mg | ORAL_TABLET | Freq: Every day | ORAL | Status: DC
  Administered 2018-03-07 – 2018-03-09 (×3): 37.5 mg via ORAL
  Filled 2018-03-07 (×4): qty 0.5

## 2018-03-07 MED ORDER — ACETAMINOPHEN 325 MG PO TABS: 650.0000 mg | ORAL_TABLET | Freq: Four times a day (QID) | ORAL | Status: DC | PRN

## 2018-03-07 MED ORDER — POLYETHYLENE GLYCOL 3350 17 G PO PACK
17.0000 g | PACK | Freq: Every day | ORAL | Status: DC
Start: 2018-03-08 — End: 2018-03-10
  Administered 2018-03-08 – 2018-03-10 (×3): 17 g via ORAL
  Filled 2018-03-07 (×3): qty 1

## 2018-03-07 MED ORDER — SODIUM CHLORIDE 0.9 % IV SOLN
INTRAVENOUS | Status: DC
  Administered 2018-03-07 – 2018-03-09 (×6): via INTRAVENOUS

## 2018-03-07 MED ORDER — MECLIZINE HCL 25 MG PO TABS
12.5000 mg | ORAL_TABLET | Freq: Three times a day (TID) | ORAL | Status: DC | PRN
  Filled 2018-03-07: qty 1

## 2018-03-07 MED ORDER — LATANOPROST 0.005 % OP SOLN
1.0000 [drp] | Freq: Every day | OPHTHALMIC | Status: DC
  Administered 2018-03-07 – 2018-03-09 (×3): 1 [drp] via OPHTHALMIC
  Filled 2018-03-07: qty 2.5

## 2018-03-07 MED ORDER — HYDROCODONE-ACETAMINOPHEN 7.5-325 MG PO TABS: 1.0000 | ORAL_TABLET | Freq: Four times a day (QID) | ORAL | Status: DC | PRN

## 2018-03-07 MED ORDER — SODIUM CHLORIDE 0.9 % IV SOLN
1.0000 g | Freq: Once | INTRAVENOUS | Status: AC
  Administered 2018-03-07: 1 g via INTRAVENOUS
  Filled 2018-03-07: qty 10

## 2018-03-07 MED ORDER — ONDANSETRON HCL 4 MG/2ML IJ SOLN: 4.0000 mg | Freq: Four times a day (QID) | INTRAMUSCULAR | Status: DC | PRN

## 2018-03-07 MED ORDER — MORPHINE SULFATE (PF) 4 MG/ML IV SOLN
1.0000 mg | INTRAVENOUS | Status: DC | PRN
Start: 2018-03-07 — End: 2018-03-10

## 2018-03-07 MED ORDER — ENOXAPARIN SODIUM 40 MG/0.4ML ~~LOC~~ SOLN
40.0000 mg | SUBCUTANEOUS | Status: DC
  Administered 2018-03-07: 40 mg via SUBCUTANEOUS
  Filled 2018-03-07 (×2): qty 0.4

## 2018-03-07 MED ORDER — ACETAMINOPHEN 650 MG RE SUPP: 650.0000 mg | Freq: Four times a day (QID) | RECTAL | Status: DC | PRN

## 2018-03-07 MED ORDER — BRIMONIDINE TARTRATE 0.2 % OP SOLN
1.0000 [drp] | Freq: Three times a day (TID) | OPHTHALMIC | Status: DC
  Administered 2018-03-09: 1 [drp] via OPHTHALMIC
  Filled 2018-03-07: qty 5

## 2018-03-07 MED ORDER — ONDANSETRON HCL 4 MG PO TABS: 4.0000 mg | ORAL_TABLET | Freq: Four times a day (QID) | ORAL | Status: DC | PRN

## 2018-03-07 MED ORDER — BRIMONIDINE-DORZOLAMIDE 0.15-2 % OP SOLN: 1.0000 [drp] | Freq: Three times a day (TID) | OPHTHALMIC | Status: DC

## 2018-03-07 MED ORDER — TRAVOPROST 0.004 % OP SOLN: 1.0000 [drp] | Freq: Every day | OPHTHALMIC | Status: DC

## 2018-03-07 MED ORDER — PANTOPRAZOLE SODIUM 40 MG PO TBEC
40.0000 mg | DELAYED_RELEASE_TABLET | Freq: Two times a day (BID) | ORAL | Status: DC
  Administered 2018-03-07 – 2018-03-10 (×6): 40 mg via ORAL
  Filled 2018-03-07 (×7): qty 1

## 2018-03-07 MED ORDER — SODIUM CHLORIDE 0.9 % IV SOLN
1.0000 g | Freq: Two times a day (BID) | INTRAVENOUS | Status: DC
  Administered 2018-03-07 – 2018-03-08 (×2): 1 g via INTRAVENOUS
  Filled 2018-03-07 (×3): qty 1

## 2018-03-07 MED ORDER — DORZOLAMIDE HCL 2 % OP SOLN
1.0000 [drp] | Freq: Three times a day (TID) | OPHTHALMIC | Status: DC
  Administered 2018-03-07 – 2018-03-10 (×7): 1 [drp] via OPHTHALMIC
  Filled 2018-03-07: qty 10

## 2018-03-07 NOTE — Progress Notes (Signed)
Orthopedic Tech Progress Note Patient Details:  Donna Summers 1920-05-08 [id number]  Ortho Devices Type of Ortho Device: Ace wrap, Volar splint Ortho Device/Splint Location: rue Ortho Device/Splint Interventions: Application   Post Interventions Patient Tolerated: Well Instructions Provided: Care of device Dr. Andrey Summers that pt will remain in hospital for now and does not need arm sling at this time; RN notified  Nikki Dom 03/07/2018, 1:15 PM

## 2018-03-07 NOTE — ED Provider Notes (Signed)
[facility] EMERGENCY DEPARTMENT Provider Note   CSN: [id number] Arrival date & time: 03/07/18  4098     History   Chief Complaint Chief Complaint  Patient presents with  . Fall  Level 5 caveat secondary to dementia Son is at bedside and giving history  HPI TURNER KUNZMAN is a 84 y.o. female.  HPI This is a 84 year old female presents from skilled nursing facility with report of fall this morning.  She has recently been seen in the hospital and had her left hip replaced.  Prior to this event she had been in a assisted living care facility.  Subsequent to the fall and hip surgery, she was placed in a skilled nursing facility.  Son is at bedside reports that she has had multiple falls since being placed in skilled nursing facility.  He reports that she was found behind the door today. He reports another fall several weeks ago with right wrist swelling.  Son reports and x-rays obtained at the facility that did not show any fracture. Past Medical History:  Diagnosis Date  . Constipation   . Constipation   . GERD (gastroesophageal reflux disease)   . Glaucoma   . History of carotid artery disease   . Hyperlipidemia   . Hypertension   . UTI (urinary tract infection)   . Vertigo     Patient Active Problem List   Diagnosis Date Noted  . History of left hip hemiarthroplasty 02/11/2018  . Pressure injury of skin 02/01/2018  . Nausea and vomiting in adult   . Closed left hip fracture, initial encounter (HCC) 01/28/2018  . CKD (chronic kidney disease), stage III (HCC) 01/28/2018  . Hypertensive urgency   . Dysphagia 01/20/2018  . Acute lower UTI 01/20/2018  . Dehydration 01/20/2018  . Weakness generalized 01/20/2018  . Symptomatic cholelithiasis 01/19/2018  . Cholelithiasis 01/19/2018  . Cholecystitis 01/06/2018  . Constipation 01/06/2018  . History of urinary retention 01/06/2018  . Abdominal pain 01/06/2018  . Vitamin D deficiency 01/06/2018  . HLD  (hyperlipidemia) 01/06/2018  . GERD (gastroesophageal reflux disease) 01/06/2018  . HTN (hypertension) 01/06/2018    Past Surgical History:  Procedure Laterality Date  . ANTERIOR APPROACH HEMI HIP ARTHROPLASTY Left 01/28/2018   Procedure: DIRECT ANTERIOR APPROACH HEMI HIP ARTHROPLASTY;  Surgeon: Tarry Kos, MD;  Location: [facility] OR;  Service: Orthopedics;  Laterality: Left;  . ESOPHAGOGASTRODUODENOSCOPY (EGD) WITH PROPOFOL N/A 01/22/2018   Procedure: ESOPHAGOGASTRODUODENOSCOPY (EGD) WITH PROPOFOL;  Surgeon: Graylin Shiver, MD;  Location: [facility];  Service: Endoscopy;  Laterality: N/A;  . UNKNOWN SURGICAL HISTORY       OB History   None      Home Medications    Prior to Admission medications   Medication Sig Start Date End Date Taking? Authorizing Provider  Brimonidine-Dorzolamide 0.15-2 % SOLN Place 1 drop into the left eye 3 (three) times daily.    [provider]  enoxaparin (LOVENOX) 40 MG/0.4ML injection Inject 0.4 mLs (40 mg total) into the skin daily. 01/28/18   Tarry Kos, MD  HYDROcodone-acetaminophen (NORCO) 7.5-325 MG tablet Take 1-2 tablets by mouth every 6 (six) hours as needed for moderate pain. 01/28/18   Tarry Kos, MD  irbesartan (AVAPRO) 75 MG tablet Take 37.5 mg by mouth at bedtime. TAKES 1/2 TABLET    [provider]  meclizine (ANTIVERT) 12.5 MG tablet Take 12.5 mg by mouth 3 (three) times daily as needed for dizziness.    [provider]  pantoprazole (PROTONIX) 40 MG tablet Take 1 tablet (40 mg total) by mouth 2 (two) times daily. 01/24/18   Purohit, Salli Quarry, MD  polyethylene glycol (MIRALAX / GLYCOLAX) packet Take 17 g by mouth daily. 01/08/18   Lenox Ponds, MD  travoprost, benzalkonium, (TRAVATAN) 0.004 % ophthalmic solution Place 1 drop into the left eye at bedtime.    [provider]    Family History No family history on file.  Social History Social History   Tobacco Use  . Smoking status: Never Smoker    . Smokeless tobacco: Never Used  Substance Use Topics  . Alcohol use: Not Currently    Frequency: Never  . Drug use: Never     Allergies   Bactrim [sulfamethoxazole-trimethoprim] and Levaquin [levofloxacin]   Review of Systems Review of Systems  Unable to perform ROS: Dementia     Physical Exam Updated Vital Signs BP (!) 156/68 (BP Location: Left Arm)   Pulse 73   Temp 98.4 F (36.9 C) (Oral)   Resp 18   SpO2 96%   Physical Exam  Constitutional: She appears well-developed and well-nourished. No distress.  HENT:  Right Ear: External ear normal.  Left Ear: External ear normal.  Nose: Nose normal.  Mouth/Throat: Oropharynx is clear and moist.  Swelling with tenderness to posterior head   Eyes: Pupils are equal, round, and reactive to light. EOM are normal.  Neck: Normal range of motion.  Towel in place to mobilize neck  Cardiovascular: Normal rate and regular rhythm.  Pulmonary/Chest: Effort normal and breath sounds normal.  Abdominal: Soft. Bowel sounds are normal.  Musculoskeletal:  Right lower extremity with full active range of motion Left lower extremity with well-healing incision lateral left hip with some diffuse tenderness to palpation Lumbar spine with some tenderness palpation Thoracic spine nontender no other signs of trauma noted on back exam Right wrist with some swelling and tenderness palpation.  Radial pulse intact.  Intrinsic hand muscles with full range of motion.  Sensation intact  Neurological: She displays normal reflexes. No cranial nerve deficit. She exhibits normal muscle tone. Coordination normal.  Patient oriented to person and to being in the hospital but not to date  Skin: Skin is warm and dry. Capillary refill takes less than 2 seconds.  Nursing note and vitals reviewed.    ED Treatments / Results  Labs (all labs ordered are listed, but only abnormal results are displayed) Labs Reviewed - No data to display  EKG EKG  Interpretation  Date/Time:  Monday Mar 07 2018 10:12:26 EDT Ventricular Rate:  81 PR Interval:    QRS Duration: 90 QT Interval:  367 QTC Calculation: 426 R Axis:   43 Text Interpretation:  Sinus rhythm Short PR interval Probable left atrial enlargement Minimal ST depression, lateral leads No significant change since last tracing Confirmed by Margarita Grizzle [phone]) on 03/07/2018 12:32:17 PM   Radiology Dg Lumbar Spine Complete  Result Date: 03/07/2018 CLINICAL DATA:  Fall 1 week ago.  Left low back and hip pain. EXAM: LUMBAR SPINE - COMPLETE 4+ VIEW COMPARISON:  CT abdomen and pelvis 01/13/2018 FINDINGS: There are 5 non rib-bearing lumbar type vertebrae. Trace degenerative retrolisthesis of L2 on L3 and trace anterolisthesis of L4 on L5 are unchanged. Vertebral body heights are preserved. No fracture is identified. Severe disc space narrowing at L2-3 and moderate narrowing at L5-S1 are unchanged. Degenerative endplate sclerosis is noted at L2-3. the bones are diffusely osteopenic. A left hip arthroplasty is partially visualized. Gas  is present diffusely throughout grossly nondilated small bowel. Extensive aortoiliac atherosclerosis is noted. IMPRESSION: Similar appearance of lumbar disc degeneration without evidence of acute osseous abnormality. Electronically Signed   By: Sebastian Ache M.D.   On: 03/07/2018 11:28   Dg Wrist Complete Right  Result Date: 03/07/2018 CLINICAL DATA:  Persistent right wrist tenderness after falling 1 week ago. Initial encounter. EXAM: RIGHT WRIST - COMPLETE 3+ VIEW COMPARISON:  None. FINDINGS: The bones are mildly demineralized. There is irregularity of the nonarticular cortex of the distal radius, especially on the lateral view, suspicious for a mildly impacted fracture. The distal ulna and carpal bones appear intact. There are mild degenerative changes in the radial aspect of the wrist and at the 4th PIP joint. There is some soft tissue swelling in the radial aspect of  the distal forearm. IMPRESSION: Findings are consistent with a mildly impacted fracture of the distal radial metaphysis. Electronically Signed   By: Carey Bullocks M.D.   On: 03/07/2018 11:28   Ct Head Wo Contrast  Result Date: 03/07/2018 CLINICAL DATA:  Slipped in the hallway and fell striking posterior head, posterior head hematoma, loss of consciousness, posterior neck pain, on Lovenox, history hypertension, hyperlipidemia EXAM: CT HEAD WITHOUT CONTRAST CT CERVICAL SPINE WITHOUT CONTRAST TECHNIQUE: Multidetector CT imaging of the head and cervical spine was performed following the standard protocol without intravenous contrast. Multiplanar CT image reconstructions of the cervical spine were also generated. COMPARISON:  CT head 01/27/2018 FINDINGS: CT HEAD FINDINGS Brain: Generalized atrophy. Normal ventricular morphology. No midline shift or mass effect. Old lacunar infarcts at external capsules. Otherwise normal appearance of brain parenchyma. No intracranial hemorrhage, mass lesion or evidence acute infarction. No extra-axial fluid collections. Vascular: Atherosclerotic calcifications of internal carotid arteries bilaterally at skull base. No hyperdense vessels. Skull: Demineralized but intact. Posterior scalp hematoma esp. RIGHT of midline Sinuses/Orbits: Small air-fluid level within sphenoid sinus. Scattered mucosal thickening in ethmoid air cells. Other: N/A CT CERVICAL SPINE FINDINGS Alignment: Normal Skull base and vertebrae: Diffuse demineralization. Visualized skull base intact. Vertebral body heights maintained. Multilevel disc space narrowing. Endplate spur formation at C4-C5 through C6-C7. Scattered facet degenerative changes. No fracture, subluxation or bone destruction. Soft tissues and spinal canal: Prevertebral soft tissues normal thickness. Extensive atherosclerotic calcifications in the carotid systems bilaterally, proximal great vessels, and at aortic arch. Disc levels:  No additional  abnormalities Upper chest: Lung apices clear Other: N/A IMPRESSION: Atrophy with small vessel chronic ischemic changes of deep cerebral white matter. Small old lacunar infarcts in the external capsules bilaterally. No acute intracranial abnormalities. Posterior scalp hematoma. Degenerative disc and facet disease changes of the cervical spine. No acute cervical spine abnormalities. Extensive atherosclerotic calcifications throughout the carotid systems, in the proximal great vessels and aortic arch. Aortic Atherosclerosis (ICD10-I70.0). Electronically Signed   By: Ulyses Southward M.D.   On: 03/07/2018 11:10   Ct Cervical Spine Wo Contrast  Result Date: 03/07/2018 CLINICAL DATA:  Slipped in the hallway and fell striking posterior head, posterior head hematoma, loss of consciousness, posterior neck pain, on Lovenox, history hypertension, hyperlipidemia EXAM: CT HEAD WITHOUT CONTRAST CT CERVICAL SPINE WITHOUT CONTRAST TECHNIQUE: Multidetector CT imaging of the head and cervical spine was performed following the standard protocol without intravenous contrast. Multiplanar CT image reconstructions of the cervical spine were also generated. COMPARISON:  CT head 01/27/2018 FINDINGS: CT HEAD FINDINGS Brain: Generalized atrophy. Normal ventricular morphology. No midline shift or mass effect. Old lacunar infarcts at external capsules. Otherwise normal appearance of brain parenchyma.  No intracranial hemorrhage, mass lesion or evidence acute infarction. No extra-axial fluid collections. Vascular: Atherosclerotic calcifications of internal carotid arteries bilaterally at skull base. No hyperdense vessels. Skull: Demineralized but intact. Posterior scalp hematoma esp. RIGHT of midline Sinuses/Orbits: Small air-fluid level within sphenoid sinus. Scattered mucosal thickening in ethmoid air cells. Other: N/A CT CERVICAL SPINE FINDINGS Alignment: Normal Skull base and vertebrae: Diffuse demineralization. Visualized skull base intact.  Vertebral body heights maintained. Multilevel disc space narrowing. Endplate spur formation at C4-C5 through C6-C7. Scattered facet degenerative changes. No fracture, subluxation or bone destruction. Soft tissues and spinal canal: Prevertebral soft tissues normal thickness. Extensive atherosclerotic calcifications in the carotid systems bilaterally, proximal great vessels, and at aortic arch. Disc levels:  No additional abnormalities Upper chest: Lung apices clear Other: N/A IMPRESSION: Atrophy with small vessel chronic ischemic changes of deep cerebral white matter. Small old lacunar infarcts in the external capsules bilaterally. No acute intracranial abnormalities. Posterior scalp hematoma. Degenerative disc and facet disease changes of the cervical spine. No acute cervical spine abnormalities. Extensive atherosclerotic calcifications throughout the carotid systems, in the proximal great vessels and aortic arch. Aortic Atherosclerosis (ICD10-I70.0). Electronically Signed   By: Ulyses Southward M.D.   On: 03/07/2018 11:10   Dg Chest Port 1 View  Result Date: 03/07/2018 CLINICAL DATA:  Pain after fall EXAM: PORTABLE CHEST 1 VIEW COMPARISON:  January 31, 2018 FINDINGS: Mild cardiomegaly, stable. The heart, hila, mediastinum, lungs, and pleura are otherwise unremarkable. IMPRESSION: No active disease. Electronically Signed   By: Gerome Sam III M.D   On: 03/07/2018 12:10   Dg Hip Unilat W Or Wo Pelvis 2-3 Views Left  Result Date: 03/07/2018 CLINICAL DATA:  Status post fall.  Left hip pain. EXAM: DG HIP (WITH OR WITHOUT PELVIS) 2-3V LEFT COMPARISON:  None. FINDINGS: Left hip arthroplasty without hardware failure or complication. On the AP view of the pelvis there is a subtle lucency involving the left inferior pubic ramus not seen on the coned-down view of the left hip or frog-leg lateral view likely artifactual. No other fracture or dislocation. Peripheral vascular atherosclerotic disease. IMPRESSION: 1. Left hip  arthroplasty without hardware failure or complication. 2. On the AP view of the pelvis there is a subtle lucency involving the left inferior pubic ramus not seen on the coned-down view of the left hip or frog-leg lateral view likely artifactual. If there is further clinical concern regarding an occult fracture, recommend a CT of the left hip. Electronically Signed   By: Elige Ko   On: 03/07/2018 11:28    Procedures Procedures (including critical care time)  Medications Ordered in ED Medications - No data to display   Initial Impression / Assessment and Plan / ED Course  I have reviewed the triage vital signs and the nursing notes.  Pertinent labs & imaging results that were available during my care of the patient were reviewed by me and considered in my medical decision making (see chart for details).   1-fever- hypertensive without tachycardia, blood cxs ordered cxr clear Urine-pending-WBC-TNTC-rocephin 1 gram ordered 2-falls- ? Secondary to 1, head ct and cervical spine radiographs negative for acute abnormality.  ?lucency left superior ramus 3-right wrist fracture- from previous fall? 4- palliative care  Vitals:   03/07/18 1145 03/07/18 1215  BP: (!) 195/62 (!) 182/69  Pulse: 84 85  Resp: 16 (!) 23  Temp:    SpO2: (!) 88% 95%     Discussed with Ms. Wertman and will see for admission Final Clinical  Impressions(s) / ED Diagnoses   Final diagnoses:  Urinary tract infection with hematuria, site unspecified  Fall, initial encounter  Closed fracture of right wrist, initial encounter  Closed fracture of superior ramus of left pubis, initial encounter [facility])    ED Discharge Orders    None       Margarita Grizzle, MD 03/07/18 1338

## 2018-03-07 NOTE — H&P (Signed)
History and Physical    Donna Summers ZOX:[id number] DOB: [birth year] DOA: 03/07/2018   PCP: [name], MD   Patient coming from:  Home    Chief Complaint: Fall  HPI: Donna Summers is a 84 y.o. female with medical history significant for HTN, HLD, CKD, dementia, brought from skilled nursing facility after sustaining an non-witnessed fall felt to be mechanical per patient report, after "bumping into the door ".  The patient had recently been seen in the hospital, admitted for left hip replacement, but had to be discharged to skilled nursing facility after having a postoperative fall.  Since then, she has had other falls including the one mentioned above.Denies subjective fevers, chills, night sweats, vision changes, or mucositis. Denies any respiratory complaints. Denies any chest pain or palpitations. Denies lower extremity swelling. Denies nausea, heartburn or change in bowel habits. Denies abdominal pain. Appetite is normal. Denies any dysuria or hematuria . Denies abnormal skin rashes, or new neuropathy. Denies any bleeding issues such as epistaxis, hematemesis, hematuria or hematochezia. Denies any headaches or history of seizures. She is able to tolerate po's   ED Course:  BP (!) 186/62   Pulse 89   Temp (!) 101.1 F (38.4 C) (Rectal)   Resp 19   SpO2 94%   Urinalysis with large leukocytes, negative nitrites Culture and urine culture pending Creatinine 1.2 at baseline  Lactic acid 1.1 White count 14.2, hemoglobin 11.7, platelets 314 CT of the head spine negative for acute findings Wrist x-ray shows mildly impacted fracture of the distal radial metaphysis (was negative per report 1 week ago)  review of Systems:  As per HPI otherwise all other systems reviewed and are negative  Past Medical History:  Diagnosis Date  . Constipation   . Constipation   . GERD (gastroesophageal reflux disease)   . Glaucoma   . History of carotid artery disease   . Hyperlipidemia   .  Hypertension   . UTI (urinary tract infection)   . Vertigo     Past Surgical History:  Procedure Laterality Date  . ANTERIOR APPROACH HEMI HIP ARTHROPLASTY Left 01/28/2018   Procedure: DIRECT ANTERIOR APPROACH HEMI HIP ARTHROPLASTY;  Surgeon: Tarry Kos, MD;  Location: [facility] OR;  Service: Orthopedics;  Laterality: Left;  . ESOPHAGOGASTRODUODENOSCOPY (EGD) WITH PROPOFOL N/A 01/22/2018   Procedure: ESOPHAGOGASTRODUODENOSCOPY (EGD) WITH PROPOFOL;  Surgeon: Graylin Shiver, MD;  Location: [facility];  Service: Endoscopy;  Laterality: N/A;  . UNKNOWN SURGICAL HISTORY      Social History Social History   Socioeconomic History  . Marital status: Widowed    Spouse name: Not on file  . Number of children: Not on file  . Years of education: Not on file  . Highest education level: Not on file  Occupational History  . Not on file  Social Needs  . Financial resource strain: Not on file  . Food insecurity:    Worry: Not on file    Inability: Not on file  . Transportation needs:    Medical: Not on file    Non-medical: Not on file  Tobacco Use  . Smoking status: Never Smoker  . Smokeless tobacco: Never Used  Substance and Sexual Activity  . Alcohol use: Not Currently    Frequency: Never  . Drug use: Never  . Sexual activity: Not Currently  Lifestyle  . Physical activity:    Days per week: Not on file    Minutes per session: Not on file  . Stress:  Not on file  Relationships  . Social connections:    Talks on phone: Not on file    Gets together: Not on file    Attends religious service: Not on file    Active member of club or organization: Not on file    Attends meetings of clubs or organizations: Not on file    Relationship status: Not on file  . Intimate partner violence:    Fear of current or ex partner: Not on file    Emotionally abused: Not on file    Physically abused: Not on file    Forced sexual activity: Not on file  Other Topics Concern  . Not on file  Social  History Narrative  . Not on file     Allergies  Allergen Reactions  . Bactrim [Sulfamethoxazole-Trimethoprim] Other (See Comments)    unknown  . Levaquin [Levofloxacin] Other (See Comments)    unknown    No family history on file.    Prior to Admission medications   Medication Sig Start Date End Date Taking? Authorizing Provider  Brimonidine-Dorzolamide 0.15-2 % SOLN Place 1 drop into the left eye 3 (three) times daily.    [provider]  enoxaparin (LOVENOX) 40 MG/0.4ML injection Inject 0.4 mLs (40 mg total) into the skin daily. 01/28/18   Tarry Kos, MD  HYDROcodone-acetaminophen (NORCO) 7.5-325 MG tablet Take 1-2 tablets by mouth every 6 (six) hours as needed for moderate pain. 01/28/18   Tarry Kos, MD  irbesartan (AVAPRO) 75 MG tablet Take 37.5 mg by mouth at bedtime. TAKES 1/2 TABLET    [provider]  meclizine (ANTIVERT) 12.5 MG tablet Take 12.5 mg by mouth 3 (three) times daily as needed for dizziness.    [provider]  pantoprazole (PROTONIX) 40 MG tablet Take 1 tablet (40 mg total) by mouth 2 (two) times daily. 01/24/18   Purohit, Salli Quarry, MD  polyethylene glycol (MIRALAX / GLYCOLAX) packet Take 17 g by mouth daily. 01/08/18   Lenox Ponds, MD  travoprost, benzalkonium, (TRAVATAN) 0.004 % ophthalmic solution Place 1 drop into the left eye at bedtime.    [provider]    Physical Exam:  Vitals:   03/07/18 1142 03/07/18 1145 03/07/18 1215 03/07/18 1230  BP:  (!) 195/62 (!) 182/69 (!) 186/62  Pulse:  84 85 89  Resp:  16 (!) 23 19  Temp: (!) 101.1 F (38.4 C)     TempSrc: Rectal     SpO2:  (!) 88% 95% 94%   Constitutional: NAD, calm, comfortable, looks younger than her stated age.   Eyes: PERRL, lids and conjunctivae normal, left eye appears smaller than the right  ENMT: Mucous membranes are moist, without exudate or lesions  Neck: normal, supple, no masses, no thyromegaly Respiratory: clear to auscultation  bilaterally, no wheezing, no crackles. Normal respiratory effort  Cardiovascular: Regular rate and rhythm,  murmur, rubs or gallops.  Trace of lower extremity edema on the left extremity edema. 2+ pedal pulses. No carotid bruits.  Abdomen: Soft, non tender, No hepatosplenomegaly.  No suprapubic tenderness bowel sounds positive.  Musculoskeletal: no clubbing / cyanosis. Moves all extremities Skin: no jaundice, No lesions. Well healed L hip scar.  Neurologic: Sensation intact  Strength equal in all extremities Psychiatric:   Alert and oriented x2, has trouble remembering date. Normal mood.     Labs on Admission: I have personally reviewed following labs and imaging studies  CBC: Recent Labs  Lab 03/07/18 0944  WBC 14.2*  NEUTROABS 11.4*  HGB 11.7*  HCT 37.6  MCV 84.7  PLT 314    Basic Metabolic Panel: Recent Labs  Lab 03/07/18 0944  NA 140  K 3.9  CL 106  CO2 23  GLUCOSE 94  BUN 13  CREATININE 1.20*  CALCIUM 9.4    GFR: CrCl cannot be calculated (Unknown ideal weight.).  Liver Function Tests: Recent Labs  Lab 03/07/18 0944  AST 23  ALT 12*  ALKPHOS 88  BILITOT 0.8  PROT 6.4*  ALBUMIN 3.2*   No results for input(s): LIPASE, AMYLASE in the last 168 hours. No results for input(s): AMMONIA in the last 168 hours.  Coagulation Profile: No results for input(s): INR, PROTIME in the last 168 hours.  Cardiac Enzymes: No results for input(s): CKTOTAL, CKMB, CKMBINDEX, TROPONINI in the last 168 hours.  BNP (last 3 results) No results for input(s): PROBNP in the last 8760 hours.  HbA1C: No results for input(s): HGBA1C in the last 72 hours.  CBG: No results for input(s): GLUCAP in the last 168 hours.  Lipid Profile: No results for input(s): CHOL, HDL, LDLCALC, TRIG, CHOLHDL, LDLDIRECT in the last 72 hours.  Thyroid Function Tests: No results for input(s): TSH, T4TOTAL, FREET4, T3FREE, THYROIDAB in the last 72 hours.  Anemia Panel: No results for input(s):  VITAMINB12, FOLATE, FERRITIN, TIBC, IRON, RETICCTPCT in the last 72 hours.  Urine analysis:    Component Value Date/Time   COLORURINE YELLOW 03/07/2018 1236   APPEARANCEUR HAZY (A) 03/07/2018 1236   LABSPEC 1.010 03/07/2018 1236   PHURINE 6.0 03/07/2018 1236   GLUCOSEU NEGATIVE 03/07/2018 1236   HGBUR SMALL (A) 03/07/2018 1236   BILIRUBINUR NEGATIVE 03/07/2018 1236   KETONESUR NEGATIVE 03/07/2018 1236   PROTEINUR NEGATIVE 03/07/2018 1236   NITRITE NEGATIVE 03/07/2018 1236   LEUKOCYTESUR LARGE (A) 03/07/2018 1236    Sepsis Labs: (procalcitonin:4,lacticidven:4) )No results found for this or any previous visit (from the past 240 hour(s)).   Radiological Exams on Admission: Dg Lumbar Spine Complete  Result Date: 03/07/2018 CLINICAL DATA:  Fall 1 week ago.  Left low back and hip pain. EXAM: LUMBAR SPINE - COMPLETE 4+ VIEW COMPARISON:  CT abdomen and pelvis 01/13/2018 FINDINGS: There are 5 non rib-bearing lumbar type vertebrae. Trace degenerative retrolisthesis of L2 on L3 and trace anterolisthesis of L4 on L5 are unchanged. Vertebral body heights are preserved. No fracture is identified. Severe disc space narrowing at L2-3 and moderate narrowing at L5-S1 are unchanged. Degenerative endplate sclerosis is noted at L2-3. the bones are diffusely osteopenic. A left hip arthroplasty is partially visualized. Gas is present diffusely throughout grossly nondilated small bowel. Extensive aortoiliac atherosclerosis is noted. IMPRESSION: Similar appearance of lumbar disc degeneration without evidence of acute osseous abnormality. Electronically Signed   By: Sebastian Ache M.D.   On: 03/07/2018 11:28   Dg Wrist Complete Right  Result Date: 03/07/2018 CLINICAL DATA:  Persistent right wrist tenderness after falling 1 week ago. Initial encounter. EXAM: RIGHT WRIST - COMPLETE 3+ VIEW COMPARISON:  None. FINDINGS: The bones are mildly demineralized. There is irregularity of the nonarticular cortex of  the distal radius, especially on the lateral view, suspicious for a mildly impacted fracture. The distal ulna and carpal bones appear intact. There are mild degenerative changes in the radial aspect of the wrist and at the 4th PIP joint. There is some soft tissue swelling in the radial aspect of the distal forearm. IMPRESSION: Findings are consistent with a mildly impacted fracture of the  distal radial metaphysis. Electronically Signed   By: Carey Bullocks M.D.   On: 03/07/2018 11:28   Ct Head Wo Contrast  Result Date: 03/07/2018 CLINICAL DATA:  Slipped in the hallway and fell striking posterior head, posterior head hematoma, loss of consciousness, posterior neck pain, on Lovenox, history hypertension, hyperlipidemia EXAM: CT HEAD WITHOUT CONTRAST CT CERVICAL SPINE WITHOUT CONTRAST TECHNIQUE: Multidetector CT imaging of the head and cervical spine was performed following the standard protocol without intravenous contrast. Multiplanar CT image reconstructions of the cervical spine were also generated. COMPARISON:  CT head 01/27/2018 FINDINGS: CT HEAD FINDINGS Brain: Generalized atrophy. Normal ventricular morphology. No midline shift or mass effect. Old lacunar infarcts at external capsules. Otherwise normal appearance of brain parenchyma. No intracranial hemorrhage, mass lesion or evidence acute infarction. No extra-axial fluid collections. Vascular: Atherosclerotic calcifications of internal carotid arteries bilaterally at skull base. No hyperdense vessels. Skull: Demineralized but intact. Posterior scalp hematoma esp. RIGHT of midline Sinuses/Orbits: Small air-fluid level within sphenoid sinus. Scattered mucosal thickening in ethmoid air cells. Other: N/A CT CERVICAL SPINE FINDINGS Alignment: Normal Skull base and vertebrae: Diffuse demineralization. Visualized skull base intact. Vertebral body heights maintained. Multilevel disc space narrowing. Endplate spur formation at C4-C5 through C6-C7. Scattered facet  degenerative changes. No fracture, subluxation or bone destruction. Soft tissues and spinal canal: Prevertebral soft tissues normal thickness. Extensive atherosclerotic calcifications in the carotid systems bilaterally, proximal great vessels, and at aortic arch. Disc levels:  No additional abnormalities Upper chest: Lung apices clear Other: N/A IMPRESSION: Atrophy with small vessel chronic ischemic changes of deep cerebral white matter. Small old lacunar infarcts in the external capsules bilaterally. No acute intracranial abnormalities. Posterior scalp hematoma. Degenerative disc and facet disease changes of the cervical spine. No acute cervical spine abnormalities. Extensive atherosclerotic calcifications throughout the carotid systems, in the proximal great vessels and aortic arch. Aortic Atherosclerosis (ICD10-I70.0). Electronically Signed   By: Ulyses Southward M.D.   On: 03/07/2018 11:10   Ct Cervical Spine Wo Contrast  Result Date: 03/07/2018 CLINICAL DATA:  Slipped in the hallway and fell striking posterior head, posterior head hematoma, loss of consciousness, posterior neck pain, on Lovenox, history hypertension, hyperlipidemia EXAM: CT HEAD WITHOUT CONTRAST CT CERVICAL SPINE WITHOUT CONTRAST TECHNIQUE: Multidetector CT imaging of the head and cervical spine was performed following the standard protocol without intravenous contrast. Multiplanar CT image reconstructions of the cervical spine were also generated. COMPARISON:  CT head 01/27/2018 FINDINGS: CT HEAD FINDINGS Brain: Generalized atrophy. Normal ventricular morphology. No midline shift or mass effect. Old lacunar infarcts at external capsules. Otherwise normal appearance of brain parenchyma. No intracranial hemorrhage, mass lesion or evidence acute infarction. No extra-axial fluid collections. Vascular: Atherosclerotic calcifications of internal carotid arteries bilaterally at skull base. No hyperdense vessels. Skull: Demineralized but intact.  Posterior scalp hematoma esp. RIGHT of midline Sinuses/Orbits: Small air-fluid level within sphenoid sinus. Scattered mucosal thickening in ethmoid air cells. Other: N/A CT CERVICAL SPINE FINDINGS Alignment: Normal Skull base and vertebrae: Diffuse demineralization. Visualized skull base intact. Vertebral body heights maintained. Multilevel disc space narrowing. Endplate spur formation at C4-C5 through C6-C7. Scattered facet degenerative changes. No fracture, subluxation or bone destruction. Soft tissues and spinal canal: Prevertebral soft tissues normal thickness. Extensive atherosclerotic calcifications in the carotid systems bilaterally, proximal great vessels, and at aortic arch. Disc levels:  No additional abnormalities Upper chest: Lung apices clear Other: N/A IMPRESSION: Atrophy with small vessel chronic ischemic changes of deep cerebral white matter. Small old lacunar infarcts in the external  capsules bilaterally. No acute intracranial abnormalities. Posterior scalp hematoma. Degenerative disc and facet disease changes of the cervical spine. No acute cervical spine abnormalities. Extensive atherosclerotic calcifications throughout the carotid systems, in the proximal great vessels and aortic arch. Aortic Atherosclerosis (ICD10-I70.0). Electronically Signed   By: Ulyses Southward M.D.   On: 03/07/2018 11:10   Dg Chest Port 1 View  Result Date: 03/07/2018 CLINICAL DATA:  Pain after fall EXAM: PORTABLE CHEST 1 VIEW COMPARISON:  January 31, 2018 FINDINGS: Mild cardiomegaly, stable. The heart, hila, mediastinum, lungs, and pleura are otherwise unremarkable. IMPRESSION: No active disease. Electronically Signed   By: Gerome Sam III M.D   On: 03/07/2018 12:10   Dg Hip Unilat W Or Wo Pelvis 2-3 Views Left  Result Date: 03/07/2018 CLINICAL DATA:  Status post fall.  Left hip pain. EXAM: DG HIP (WITH OR WITHOUT PELVIS) 2-3V LEFT COMPARISON:  None. FINDINGS: Left hip arthroplasty without hardware failure or  complication. On the AP view of the pelvis there is a subtle lucency involving the left inferior pubic ramus not seen on the coned-down view of the left hip or frog-leg lateral view likely artifactual. No other fracture or dislocation. Peripheral vascular atherosclerotic disease. IMPRESSION: 1. Left hip arthroplasty without hardware failure or complication. 2. On the AP view of the pelvis there is a subtle lucency involving the left inferior pubic ramus not seen on the coned-down view of the left hip or frog-leg lateral view likely artifactual. If there is further clinical concern regarding an occult fracture, recommend a CT of the left hip. Electronically Signed   By: Elige Ko   On: 03/07/2018 11:28    EKG: Independently reviewed.  Assessment/Plan Active Problems:   History of urinary retention   Vitamin D deficiency   HLD (hyperlipidemia)   GERD (gastroesophageal reflux disease)   HTN (hypertension)   CKD (chronic kidney disease), stage III (HCC)   Glaucoma     Falls in the setting of deconditioning, and asymptomatic UTI, non toxic appearing. Tmax 101.   CT of the head spine negative for acute findings. Wrist x-ray shows mildly impacted fracture of the distal radial metaphysis (was negative per report 1 week ago). CXR negative for acute changes Urinalysis with large leukocytes, negative nitrites, Culture and urine culture pending. Lactic acid 1.1 White count 14.2  Received  IV pain meds, 2 L IVF immobilized, right arm on a soft cast . Given Rocephin IV  Admit to med surg obs  IVF Pain control with Morphine and oral pain meds  Continue ROcephin for now  Urine cultures Blood cultures   CBC in am  If remains in hospital will obtain PT/OT     Hypertension BP  186/62   Pulse 89    Continue home anti-hypertensive medications    Hyperlipidemia Continue home statins  GERD, no acute symptoms Continue PPI  Chronic kidney disease stage 3, Cr 1.2 at baseline  Lab Results  Component  Value Date   CREATININE 1.20 (H) 03/07/2018   CREATININE 1.23 (H) 02/02/2018   CREATININE 1.18 (H) 02/01/2018  IVF  Repeat BMET in am   Hold NSAIDS  Glaucoma, continue eye drops   GERD, no acute symptoms Continue PPI   DVT prophylaxis:  Lovenox Code Status:    DNR Family Communication:  Discussed with patient.  Disposition Plan: Expect patient to be discharged to Nursing home after condition improves Consults called:    Palliative Care for GOC  Admission status: Medsurg Obs    [place] Dec  Gwynneth Munson, PA-C [facility]   Amion text  [phone]   03/07/2018, 1:31 PM

## 2018-03-07 NOTE — ED Triage Notes (Signed)
Pt reports that she was walking in the hallway this morning when she slipped and fell at approx 0730. Pt is alert and at baseline. Pt reports rt wrist pain and pain to the back of her head with a hematoma. Pt also reports left hip pain and has had previous surgery there.

## 2018-03-07 NOTE — ED Notes (Signed)
Ortho paged for wrist splint 

## 2018-03-07 NOTE — Progress Notes (Signed)
Donna Summers is a 84 y.o. female patient admitted from ED awake, alert - oriented  X 3- no acute distress noted.  VSS - Blood pressure 116/65, pulse 80, temperature 98.9 F (37.2 C), temperature source Oral, resp. rate 19, SpO2 98 %.    IV in place, occlusive dsg intact without redness.  Orientation to room, and floor completed with information packet given to patient/family.  Patient declined safety video at this time.  Admission INP armband ID verified with patient/family, and in place.   SR up x 2, fall assessment complete, with patient and family able to verbalize understanding of risk associated with falls, and verbalized understanding to call nsg before up out of bed.  Call light within reach, patient able to voice, and demonstrate understanding.  Skin, clean-dry- intact without evidence of bruising, or skin tears.   No evidence of skin break down noted on exam.     Will cont to eval and treat per MD orders.  Eligah East, RN 03/07/2018 6:55 PM

## 2018-03-07 NOTE — ED Notes (Signed)
Pt placed on 2L [place] for spo2 87% on RA.

## 2018-03-08 ENCOUNTER — Other Ambulatory Visit: Payer: Self-pay

## 2018-03-08 ENCOUNTER — Ambulatory Visit (INDEPENDENT_AMBULATORY_CARE_PROVIDER_SITE_OTHER): Payer: Medicare Other | Admitting: Orthopaedic Surgery

## 2018-03-08 ENCOUNTER — Encounter (HOSPITAL_COMMUNITY): Payer: Self-pay | Admitting: General Practice

## 2018-03-08 DIAGNOSIS — Z515 Encounter for palliative care: Secondary | ICD-10-CM

## 2018-03-08 DIAGNOSIS — Z881 Allergy status to other antibiotic agents status: Secondary | ICD-10-CM | POA: Diagnosis not present

## 2018-03-08 DIAGNOSIS — R0602 Shortness of breath: Secondary | ICD-10-CM

## 2018-03-08 DIAGNOSIS — H409 Unspecified glaucoma: Secondary | ICD-10-CM | POA: Diagnosis present

## 2018-03-08 DIAGNOSIS — N183 Chronic kidney disease, stage 3 (moderate): Secondary | ICD-10-CM | POA: Diagnosis present

## 2018-03-08 DIAGNOSIS — W19XXXD Unspecified fall, subsequent encounter: Secondary | ICD-10-CM | POA: Diagnosis not present

## 2018-03-08 DIAGNOSIS — B961 Klebsiella pneumoniae [K. pneumoniae] as the cause of diseases classified elsewhere: Secondary | ICD-10-CM | POA: Diagnosis present

## 2018-03-08 DIAGNOSIS — K210 Gastro-esophageal reflux disease with esophagitis: Secondary | ICD-10-CM

## 2018-03-08 DIAGNOSIS — Z6826 Body mass index (BMI) 26.0-26.9, adult: Secondary | ICD-10-CM | POA: Diagnosis not present

## 2018-03-08 DIAGNOSIS — N390 Urinary tract infection, site not specified: Secondary | ICD-10-CM | POA: Diagnosis present

## 2018-03-08 DIAGNOSIS — Z7189 Other specified counseling: Secondary | ICD-10-CM

## 2018-03-08 DIAGNOSIS — R52 Pain, unspecified: Secondary | ICD-10-CM

## 2018-03-08 DIAGNOSIS — E7800 Pure hypercholesterolemia, unspecified: Secondary | ICD-10-CM | POA: Diagnosis not present

## 2018-03-08 DIAGNOSIS — B965 Pseudomonas (aeruginosa) (mallei) (pseudomallei) as the cause of diseases classified elsewhere: Secondary | ICD-10-CM | POA: Diagnosis present

## 2018-03-08 DIAGNOSIS — Z882 Allergy status to sulfonamides status: Secondary | ICD-10-CM | POA: Diagnosis not present

## 2018-03-08 DIAGNOSIS — S32512A Fracture of superior rim of left pubis, initial encounter for closed fracture: Secondary | ICD-10-CM | POA: Diagnosis not present

## 2018-03-08 DIAGNOSIS — Z66 Do not resuscitate: Secondary | ICD-10-CM | POA: Diagnosis present

## 2018-03-08 DIAGNOSIS — R319 Hematuria, unspecified: Secondary | ICD-10-CM | POA: Diagnosis present

## 2018-03-08 DIAGNOSIS — E785 Hyperlipidemia, unspecified: Secondary | ICD-10-CM | POA: Diagnosis present

## 2018-03-08 DIAGNOSIS — Z79899 Other long term (current) drug therapy: Secondary | ICD-10-CM | POA: Diagnosis not present

## 2018-03-08 DIAGNOSIS — S62101A Fracture of unspecified carpal bone, right wrist, initial encounter for closed fracture: Secondary | ICD-10-CM | POA: Diagnosis present

## 2018-03-08 DIAGNOSIS — F0390 Unspecified dementia without behavioral disturbance: Secondary | ICD-10-CM | POA: Diagnosis present

## 2018-03-08 DIAGNOSIS — Z87440 Personal history of urinary (tract) infections: Secondary | ICD-10-CM | POA: Diagnosis not present

## 2018-03-08 DIAGNOSIS — E559 Vitamin D deficiency, unspecified: Secondary | ICD-10-CM | POA: Diagnosis present

## 2018-03-08 DIAGNOSIS — W19XXXA Unspecified fall, initial encounter: Secondary | ICD-10-CM | POA: Diagnosis present

## 2018-03-08 DIAGNOSIS — S32592A Other specified fracture of left pubis, initial encounter for closed fracture: Secondary | ICD-10-CM | POA: Diagnosis present

## 2018-03-08 DIAGNOSIS — E440 Moderate protein-calorie malnutrition: Secondary | ICD-10-CM | POA: Diagnosis present

## 2018-03-08 DIAGNOSIS — K219 Gastro-esophageal reflux disease without esophagitis: Secondary | ICD-10-CM | POA: Diagnosis present

## 2018-03-08 DIAGNOSIS — Z96642 Presence of left artificial hip joint: Secondary | ICD-10-CM | POA: Diagnosis present

## 2018-03-08 DIAGNOSIS — R296 Repeated falls: Secondary | ICD-10-CM | POA: Diagnosis present

## 2018-03-08 DIAGNOSIS — I129 Hypertensive chronic kidney disease with stage 1 through stage 4 chronic kidney disease, or unspecified chronic kidney disease: Secondary | ICD-10-CM | POA: Diagnosis present

## 2018-03-08 LAB — CBC
HCT: 33.9 % — ABNORMAL LOW (ref 36.0–46.0)
Hemoglobin: 10.4 g/dL — ABNORMAL LOW (ref 12.0–15.0)
MCH: 26.1 pg (ref 26.0–34.0)
MCHC: 30.7 g/dL (ref 30.0–36.0)
MCV: 85 fL (ref 78.0–100.0)
PLATELETS: 304 10*3/uL (ref 150–400)
RBC: 3.99 MIL/uL (ref 3.87–5.11)
RDW: 15.2 % (ref 11.5–15.5)
WBC: 9.5 10*3/uL (ref 4.0–10.5)

## 2018-03-08 LAB — BASIC METABOLIC PANEL
ANION GAP: 9 (ref 5–15)
BUN: 14 mg/dL (ref 6–20)
CO2: 23 mmol/L (ref 22–32)
CREATININE: 1.12 mg/dL — AB (ref 0.44–1.00)
Calcium: 8.7 mg/dL — ABNORMAL LOW (ref 8.9–10.3)
Chloride: 107 mmol/L (ref 101–111)
GFR calc Af Amer: 47 mL/min — ABNORMAL LOW (ref >60)
GFR calc non Af Amer: 40 mL/min — ABNORMAL LOW (ref >60)
GLUCOSE: 91 mg/dL (ref 65–99)
Potassium: 3.5 mmol/L (ref 3.5–5.1)
Sodium: 139 mmol/L (ref 135–145)

## 2018-03-08 MED ORDER — SODIUM CHLORIDE 0.9 % IV SOLN
1.0000 g | INTRAVENOUS | Status: DC
  Administered 2018-03-09: 1 g via INTRAVENOUS
  Filled 2018-03-08 (×2): qty 1

## 2018-03-08 MED ORDER — GUAIFENESIN-DM 100-10 MG/5ML PO SYRP
5.0000 mL | ORAL_SOLUTION | ORAL | Status: DC | PRN
  Administered 2018-03-08 – 2018-03-10 (×9): 5 mL via ORAL
  Filled 2018-03-08 (×9): qty 5

## 2018-03-08 MED ORDER — IPRATROPIUM-ALBUTEROL 0.5-2.5 (3) MG/3ML IN SOLN: 3.0000 mL | Freq: Three times a day (TID) | RESPIRATORY_TRACT | Status: DC

## 2018-03-08 MED ORDER — ACETAMINOPHEN 160 MG/5ML PO SOLN
650.0000 mg | Freq: Three times a day (TID) | ORAL | Status: DC
  Administered 2018-03-08 – 2018-03-10 (×6): 650 mg via ORAL
  Filled 2018-03-08 (×6): qty 20.3

## 2018-03-08 MED ORDER — ENOXAPARIN SODIUM 30 MG/0.3ML ~~LOC~~ SOLN
30.0000 mg | SUBCUTANEOUS | Status: DC
  Administered 2018-03-08 – 2018-03-09 (×2): 30 mg via SUBCUTANEOUS
  Filled 2018-03-08 (×2): qty 0.3

## 2018-03-08 NOTE — Care Management Note (Signed)
Case Management Note  Patient Details  Name: Donna Summers MRN: [id number] Date of Birth: [birth year]  Subjective/Objective:      Pt s/p fall @ SNF/ [facility]. Suffered wrist fx. Present with UTI, cultures pending.             [name] (Son) [name] [phone]         PCP: Willow Ora  Action/Plan: Transition back to SNF when medically stable ... CSW will manage disposition to facility. NCM will continue to monitor for needs.  Expected Discharge Date:                  Expected Discharge Plan:     In-House Referral:   CSW  Discharge planning Services     Post Acute Care Choice:    Choice offered to:     DME Arranged:    DME Agency:     HH Arranged:    HH Agency:     Status of Service:   in process  If discussed at [employer], dates discussed:    Additional Comments:  Epifanio Lesches, RN 03/08/2018, 8:35 AM

## 2018-03-08 NOTE — Progress Notes (Signed)
PROGRESS NOTE    VIRGIA KELNER  ZOX:[id number] DOB: 05/14/1920 DOA: 03/07/2018 PCP: [name], MD   Brief Narrative:   84 year old with a history of CKD, dementia, hypertension, hyperlipidemia was brought from skilled nursing facility after sustaining an unwitnessed fall.  History is somewhat been unclear that she does not remember much due to her dementia but states typically she keeps bumping into things and had couple of falls previously with minor head trauma without any intracranial bleeding.  Over here she was diagnosed with urinary tract infection, mild leukocytosis and fever of 101 therefore started on IV Rocephin.  Due to her falls PT/OT were consulted.  She was also noted to have fracture of the right wrist therefore splint was placed.  Assessment & Plan:   Active Problems:   History of urinary retention   Vitamin D deficiency   HLD (hyperlipidemia)   GERD (gastroesophageal reflux disease)   HTN (hypertension)   CKD (chronic kidney disease), stage III (HCC)   Glaucoma   Fall   Multiple falls secondary to gait or balance issues Right wrist fracture -Patient continues to remain at significant fall risk given her advanced age and some deconditioning - No other obvious trauma is noted.  Will get another PT/OT evaluation - Palliative care is consulted who will meet with patient's family later on today, appreciate their help.  Urinary tract infection -Cultures have been sent, currently awaiting speciation and sensitivities.  Reviewing the previous cultures we will continue patient on cefepime as she has grown Pseudomonas and Klebsiella.  Essential hypertension -Continue Avapro 37.5 mg at bedtime  Hyperlipidemia -Not in any statin?  GERD -Continue Protonix 40 mg twice daily  Chronic kidney disease stage III -Avoid nephrotoxic drugs.  Monitor urine output.  Creatinine is around baseline of 1.1  Glaucoma -Continue home eyedrops  DVT prophylaxis: Lovenox Code  Status: DO NOT RESUSCITATE Family Communication: None at bedside Disposition Plan: Patient will need another 24-48 hours of inpatient stay.  It is my clinical opinion that admission to INPATIENT is reasonable and necessary in this 84 y.o. female . presenting with symptoms of multiple falls and fever concerning for urinary tract infection and some dehydration along with deconditioning . in the context of PMH including: Dementia, hypertension, hyperlipidemia . with pertinent positives on physical exam including: Limited range of right wrist, splint in place . and pertinent positives on radiographic and laboratory data including: UA suggestive of urinary tract infection . Workup and treatment include currently she is getting IV antibiotics, UA speciation and sensitivities are pending.  Will still need PT/OT evaluation.   Given the aforementioned, the predictability of an adverse outcome is felt to be significant. I expect that the patient will require at least 2 midnights in the hospital to treat this condition.   Consultants:   Palliative care  Procedures:   Splint placed 5/20  Antimicrobials:   Cefepime 5/20 >>   Subjective: No complaints this morning.  Review of Systems Otherwise negative except as per HPI, including: General: Denies fever, chills, night sweats or unintended weight loss. Resp: Denies cough, wheezing, shortness of breath. Cardiac: Denies chest pain, palpitations, orthopnea, paroxysmal nocturnal dyspnea. GI: Denies abdominal pain, nausea, vomiting, diarrhea or constipation GU: Denies dysuria, frequency, hesitancy or incontinence MS: Denies muscle aches, joint pain or swelling Neuro: Denies headache, neurologic deficits (focal weakness, numbness, tingling), abnormal gait Psych: Denies anxiety, depression, SI/HI/AVH Skin: Denies new rashes or lesions ID: Denies sick contacts, exotic exposures, travel  Objective: Vitals:  03/07/18 2029 03/08/18 0424 03/08/18  0426 03/08/18 0500  BP: (!) 153/48 (!) 175/66    Pulse: 94 79 82   Resp: 17 18    Temp: 99.4 F (37.4 C) 99 F (37.2 C)    TempSrc: Oral Oral    SpO2: 94% (!) 88% 92%   Weight:    68 kg (150 lb)  Height:        Intake/Output Summary (Last 24 hours) at 03/08/2018 1201 Last data filed at 03/08/2018 1056 Gross per 24 hour  Intake 410 ml  Output 150 ml  Net 260 ml   Filed Weights   03/07/18 1858 03/08/18 0500  Weight: 67.1 kg (148 lb) 68 kg (150 lb)    Examination:  General exam: Appears calm and comfortable, elderly frail-appearing Respiratory system: Clear to auscultation. Respiratory effort normal. Cardiovascular system: S1 & S2 heard, RRR. No JVD, murmurs, rubs, gallops or clicks. No pedal edema. Gastrointestinal system: Abdomen is nondistended, soft and nontender. No organomegaly or masses felt. Normal bowel sounds heard. Central nervous system: Alert and oriented. No focal neurological deficits. Extremities: Symmetric 4 x 5 power.  Right-sided wrist splint in place Skin: No rashes, lesions or ulcers Psychiatry: Judgement and insight appear normal. Mood & affect appropriate.     Data Reviewed:   CBC: Recent Labs  Lab 03/07/18 0944 03/08/18 0319  WBC 14.2* 9.5  NEUTROABS 11.4*  --   HGB 11.7* 10.4*  HCT 37.6 33.9*  MCV 84.7 85.0  PLT 314 304   Basic Metabolic Panel: Recent Labs  Lab 03/07/18 0944 03/08/18 0319  NA 140 139  K 3.9 3.5  CL 106 107  CO2 23 23  GLUCOSE 94 91  BUN 13 14  CREATININE 1.20* 1.12*  CALCIUM 9.4 8.7*   GFR: Estimated Creatinine Clearance: 27.2 mL/min (A) (by C-G formula based on SCr of 1.12 mg/dL (H)). Liver Function Tests: Recent Labs  Lab 03/07/18 0944  AST 23  ALT 12*  ALKPHOS 88  BILITOT 0.8  PROT 6.4*  ALBUMIN 3.2*   No results for input(s): LIPASE, AMYLASE in the last 168 hours. No results for input(s): AMMONIA in the last 168 hours. Coagulation Profile: No results for input(s): INR, PROTIME in the last 168  hours. Cardiac Enzymes: No results for input(s): CKTOTAL, CKMB, CKMBINDEX, TROPONINI in the last 168 hours. BNP (last 3 results) No results for input(s): PROBNP in the last 8760 hours. HbA1C: No results for input(s): HGBA1C in the last 72 hours. CBG: No results for input(s): GLUCAP in the last 168 hours. Lipid Profile: No results for input(s): CHOL, HDL, LDLCALC, TRIG, CHOLHDL, LDLDIRECT in the last 72 hours. Thyroid Function Tests: No results for input(s): TSH, T4TOTAL, FREET4, T3FREE, THYROIDAB in the last 72 hours. Anemia Panel: No results for input(s): VITAMINB12, FOLATE, FERRITIN, TIBC, IRON, RETICCTPCT in the last 72 hours. Sepsis Labs: Recent Labs  Lab 03/07/18 1212  LATICACIDVEN 1.10    Recent Results (from the past 240 hour(s))  Urine culture     Status: Abnormal (Preliminary result)   Collection Time: 03/07/18 12:36 PM  Result Value Ref Range Status   Specimen Description URINE, RANDOM  Final   Special Requests NONE  Final   Culture (A)  Final    >=100,000 COLONIES/mL PSEUDOMONAS AERUGINOSA SUSCEPTIBILITIES TO FOLLOW Performed at [facility], [address] [place], [place] [address]    Report Status PENDING  Incomplete         Radiology Studies: Dg Lumbar Spine Complete  Result Date: 03/07/2018 CLINICAL DATA:  Fall 1 week ago.  Left low back and hip pain. EXAM: LUMBAR SPINE - COMPLETE 4+ VIEW COMPARISON:  CT abdomen and pelvis 01/13/2018 FINDINGS: There are 5 non rib-bearing lumbar type vertebrae. Trace degenerative retrolisthesis of L2 on L3 and trace anterolisthesis of L4 on L5 are unchanged. Vertebral body heights are preserved. No fracture is identified. Severe disc space narrowing at L2-3 and moderate narrowing at L5-S1 are unchanged. Degenerative endplate sclerosis is noted at L2-3. the bones are diffusely osteopenic. A left hip arthroplasty is partially visualized. Gas is present diffusely throughout grossly nondilated small bowel. Extensive  aortoiliac atherosclerosis is noted. IMPRESSION: Similar appearance of lumbar disc degeneration without evidence of acute osseous abnormality. Electronically Signed   By: Sebastian Ache M.D.   On: 03/07/2018 11:28   Dg Wrist Complete Right  Result Date: 03/07/2018 CLINICAL DATA:  Persistent right wrist tenderness after falling 1 week ago. Initial encounter. EXAM: RIGHT WRIST - COMPLETE 3+ VIEW COMPARISON:  None. FINDINGS: The bones are mildly demineralized. There is irregularity of the nonarticular cortex of the distal radius, especially on the lateral view, suspicious for a mildly impacted fracture. The distal ulna and carpal bones appear intact. There are mild degenerative changes in the radial aspect of the wrist and at the 4th PIP joint. There is some soft tissue swelling in the radial aspect of the distal forearm. IMPRESSION: Findings are consistent with a mildly impacted fracture of the distal radial metaphysis. Electronically Signed   By: Carey Bullocks M.D.   On: 03/07/2018 11:28   Ct Head Wo Contrast  Result Date: 03/07/2018 CLINICAL DATA:  Slipped in the hallway and fell striking posterior head, posterior head hematoma, loss of consciousness, posterior neck pain, on Lovenox, history hypertension, hyperlipidemia EXAM: CT HEAD WITHOUT CONTRAST CT CERVICAL SPINE WITHOUT CONTRAST TECHNIQUE: Multidetector CT imaging of the head and cervical spine was performed following the standard protocol without intravenous contrast. Multiplanar CT image reconstructions of the cervical spine were also generated. COMPARISON:  CT head 01/27/2018 FINDINGS: CT HEAD FINDINGS Brain: Generalized atrophy. Normal ventricular morphology. No midline shift or mass effect. Old lacunar infarcts at external capsules. Otherwise normal appearance of brain parenchyma. No intracranial hemorrhage, mass lesion or evidence acute infarction. No extra-axial fluid collections. Vascular: Atherosclerotic calcifications of internal carotid  arteries bilaterally at skull base. No hyperdense vessels. Skull: Demineralized but intact. Posterior scalp hematoma esp. RIGHT of midline Sinuses/Orbits: Small air-fluid level within sphenoid sinus. Scattered mucosal thickening in ethmoid air cells. Other: N/A CT CERVICAL SPINE FINDINGS Alignment: Normal Skull base and vertebrae: Diffuse demineralization. Visualized skull base intact. Vertebral body heights maintained. Multilevel disc space narrowing. Endplate spur formation at C4-C5 through C6-C7. Scattered facet degenerative changes. No fracture, subluxation or bone destruction. Soft tissues and spinal canal: Prevertebral soft tissues normal thickness. Extensive atherosclerotic calcifications in the carotid systems bilaterally, proximal great vessels, and at aortic arch. Disc levels:  No additional abnormalities Upper chest: Lung apices clear Other: N/A IMPRESSION: Atrophy with small vessel chronic ischemic changes of deep cerebral white matter. Small old lacunar infarcts in the external capsules bilaterally. No acute intracranial abnormalities. Posterior scalp hematoma. Degenerative disc and facet disease changes of the cervical spine. No acute cervical spine abnormalities. Extensive atherosclerotic calcifications throughout the carotid systems, in the proximal great vessels and aortic arch. Aortic Atherosclerosis (ICD10-I70.0). Electronically Signed   By: Ulyses Southward M.D.   On: 03/07/2018 11:10   Ct Cervical Spine Wo Contrast  Result Date: 03/07/2018 CLINICAL  DATA:  Slipped in the hallway and fell striking posterior head, posterior head hematoma, loss of consciousness, posterior neck pain, on Lovenox, history hypertension, hyperlipidemia EXAM: CT HEAD WITHOUT CONTRAST CT CERVICAL SPINE WITHOUT CONTRAST TECHNIQUE: Multidetector CT imaging of the head and cervical spine was performed following the standard protocol without intravenous contrast. Multiplanar CT image reconstructions of the cervical spine were  also generated. COMPARISON:  CT head 01/27/2018 FINDINGS: CT HEAD FINDINGS Brain: Generalized atrophy. Normal ventricular morphology. No midline shift or mass effect. Old lacunar infarcts at external capsules. Otherwise normal appearance of brain parenchyma. No intracranial hemorrhage, mass lesion or evidence acute infarction. No extra-axial fluid collections. Vascular: Atherosclerotic calcifications of internal carotid arteries bilaterally at skull base. No hyperdense vessels. Skull: Demineralized but intact. Posterior scalp hematoma esp. RIGHT of midline Sinuses/Orbits: Small air-fluid level within sphenoid sinus. Scattered mucosal thickening in ethmoid air cells. Other: N/A CT CERVICAL SPINE FINDINGS Alignment: Normal Skull base and vertebrae: Diffuse demineralization. Visualized skull base intact. Vertebral body heights maintained. Multilevel disc space narrowing. Endplate spur formation at C4-C5 through C6-C7. Scattered facet degenerative changes. No fracture, subluxation or bone destruction. Soft tissues and spinal canal: Prevertebral soft tissues normal thickness. Extensive atherosclerotic calcifications in the carotid systems bilaterally, proximal great vessels, and at aortic arch. Disc levels:  No additional abnormalities Upper chest: Lung apices clear Other: N/A IMPRESSION: Atrophy with small vessel chronic ischemic changes of deep cerebral white matter. Small old lacunar infarcts in the external capsules bilaterally. No acute intracranial abnormalities. Posterior scalp hematoma. Degenerative disc and facet disease changes of the cervical spine. No acute cervical spine abnormalities. Extensive atherosclerotic calcifications throughout the carotid systems, in the proximal great vessels and aortic arch. Aortic Atherosclerosis (ICD10-I70.0). Electronically Signed   By: Ulyses Southward M.D.   On: 03/07/2018 11:10   Dg Chest Port 1 View  Result Date: 03/07/2018 CLINICAL DATA:  Pain after fall EXAM: PORTABLE  CHEST 1 VIEW COMPARISON:  January 31, 2018 FINDINGS: Mild cardiomegaly, stable. The heart, hila, mediastinum, lungs, and pleura are otherwise unremarkable. IMPRESSION: No active disease. Electronically Signed   By: Gerome Sam III M.D   On: 03/07/2018 12:10   Dg Hip Unilat W Or Wo Pelvis 2-3 Views Left  Result Date: 03/07/2018 CLINICAL DATA:  Status post fall.  Left hip pain. EXAM: DG HIP (WITH OR WITHOUT PELVIS) 2-3V LEFT COMPARISON:  None. FINDINGS: Left hip arthroplasty without hardware failure or complication. On the AP view of the pelvis there is a subtle lucency involving the left inferior pubic ramus not seen on the coned-down view of the left hip or frog-leg lateral view likely artifactual. No other fracture or dislocation. Peripheral vascular atherosclerotic disease. IMPRESSION: 1. Left hip arthroplasty without hardware failure or complication. 2. On the AP view of the pelvis there is a subtle lucency involving the left inferior pubic ramus not seen on the coned-down view of the left hip or frog-leg lateral view likely artifactual. If there is further clinical concern regarding an occult fracture, recommend a CT of the left hip. Electronically Signed   By: Elige Ko   On: 03/07/2018 11:28        Scheduled Meds: . brimonidine  1 drop Left Eye TID   Or  . dorzolamide  1 drop Left Eye TID  . enoxaparin (LOVENOX) injection  40 mg Subcutaneous Q24H  . irbesartan  37.5 mg Oral QHS  . latanoprost  1 drop Left Eye QHS  . pantoprazole  40 mg Oral BID  .  polyethylene glycol  17 g Oral Daily   Continuous Infusions: . sodium chloride 100 mL/hr at 03/08/18 0718  . ceFEPime (MAXIPIME) IV Stopped (03/08/18 0945)     LOS: 0 days    I have spent 35 minutes face to face with the patient and on the ward discussing the patients care, assessment, plan and disposition with other care givers. >50% of the time was devoted counseling the patient about the risks and benefits of treatment and  coordinating care.     Antario Yasuda Joline Maxcy, MD [facility] Pager [phone]   If 7PM-7AM, please contact night-coverage [email or web address] Password [email or web address] 03/08/2018, 12:01 PM

## 2018-03-08 NOTE — Progress Notes (Signed)
PMT NP to meet with patient and two sons today at 2pm.   NO CHARGE  Vennie Homans, FNP-C Palliative Medicine Team  Phone: [phone] Fax: [phone]

## 2018-03-08 NOTE — Progress Notes (Signed)
PHARMACY NOTE:  ANTIMICROBIAL RENAL DOSAGE ADJUSTMENT  Current antimicrobial regimen includes a mismatch between antimicrobial dosage and estimated renal function.  As per policy approved by the Pharmacy & Therapeutics and Medical Executive Committees, the antimicrobial dosage will be adjusted accordingly.  Current antimicrobial dosage:  Cefepime 1 g IV q12h  Indication: UTI  Renal Function:  Estimated Creatinine Clearance: 27.2 mL/min (A) (by C-G formula based on SCr of 1.12 mg/dL (H)).      On intermittent HD, scheduled:      On CRRT    Antimicrobial dosage has been changed to:  Cefepime 1 g IV q24h  Additional comments: >100 K Pseudomonas aeruginosa growing   Thank you for allowing pharmacy to be a part of this patient's care.  Loura Back, PharmD, BCPS Clinical Pharmacist Clinical phone for 03/08/2018 until 4p is [phone] After 4p, please call Main Rx at [phone] for assistance 03/08/2018 3:11 PM

## 2018-03-08 NOTE — Consult Note (Signed)
Consultation Note Date: 03/08/2018   Patient Name: Donna Summers  DOB: [birth year]  MRN: [id number]  Age / Sex: 84 y.o., female  PCP: Colon Branch, MD Referring Physician: Damita Lack, MD  Reason for Consultation: Establishing goals of care  HPI/Patient Profile: 84 y.o. female  with past medical history recurrent UTI's, HTN, HLD, CAD, glaucoma, GERD, dementia admitted on 03/07/2018 with unwitnessed fall. Recent hospitalization after fall with left hip fracture s/p hip arthroplasty. In ED, wrist xray reveals mildly impacted fracture of distal radial metaphysis (negative per one week ago per family). CT head/spine negative. Chest xray negative for acute changes. UA positive for UTI. Receiving IV antibiotics and IVF. Palliative medicine consultation for goals of care.   Clinical Assessment and Goals of Care: I have reviewed medical records, discussed with care team, and met with patient, two sons Vevelyn Royals and Merrilee Seashore) and DIL Tye Maryland) at bedside to discuss diagnosis, prognosis, GOC, EOL wishes, disposition and options. This morning, patient awake, alert, periods of pleasant confusion while eating breakfast. This afternoon, patient is resting during my visit with family.   Introduced Palliative Medicine as specialized medical care for people living with serious illness. It focuses on providing relief from the symptoms and stress of a serious illness. The goal is to improve quality of life for both the patient and the family.  We discussed a brief life review of the patient. Family describes Ebba as a very "strong-willed" and "stubborn" individual. She worked as a Scientist, clinical (histocompatibility and immunogenetics) and loved to garden. She has remained a very "independent" individual up until three years ago, when family moved her to ALF. Family recaps the last 60 days of recurrent hospital admissions secondary to esophageal ulceration which led to rehab and  recurrent falls/UTI's. Vevelyn Royals tells me she has not been diagnosed with dementia but the family is noticing increasing cognitive impairment.   Discussed hospital diagnoses, interventions, and underlying deconditioning secondary to falls/hip fractures. Family speaks of feeling she needed hospice a few times in the past but that she has always "rallied."   I attempted to elicit values and goals of care important to the patient and family. Sons tell me their biggest concern is her "comfort and safety." They wish she could return back to her ALF, but also understand that she is requiring a higher level of care and that it is not safe to return to ALF. Patient has been at [facility] for rehab. [facility] is requesting he search for memory care unit that may accept her, because she is continuously wanting to get out of bed to "wander."   The difference between aggressive medical intervention and comfort care was considered in light of the patient and family's goals of care.   Advanced directives, concepts specific to code status, artifical feeding and hydration, and rehospitalization were considered and discussed. Sons confirm DNR code status. Merrilee Seashore tells me he is her documented HCPOA but that he will need to obtain a copy of her living will. Sons do speak of her EOL wishes  against heroic measure including feeding tube. Introduced MOST form and encouraged family to consider and complete with me or another healthcare provider.   Palliative care and hospice services outpatient were explained and offered. Educated on hospice philosophy. Family is interested in outpatient hospice services, understanding high risk for recurrent falls, infections, and dehydration leading to re-hospitalization. They share how taxing it has become for the patient with multiple appointments and hospitalization.   Also discussed her symptoms including pain and cough. Tye Maryland speaks of her high pain tolerance. We discussed adding  scheduled tylenol. She also has a cough that per family is relieved by cough medicine and schedule nebulizers, that were being done at [facility].   Questions and concerns were addressed. PMT contact information.    SUMMARY OF RECOMMENDATIONS    Family confirms DNR and patient wishes against heroic interventions. Son, Merrilee Seashore is documented POA. Copy requested.   Continue current medical management and interventions.   Family goals are "comfort and safety." They were recently told at [facility] to consider memory care unit for her. SW consult to help with discharge planning. Family interested in hospice services when discharged.   Symptom management--see below.   Family wondering if ortho f/u can be done inpatient. Will discuss with attending.   PMT will follow.  Code Status/Advance Care Planning:  DNR  Symptom Management:   Duo-neb q8h scheduled (Per family request. Patient was receiving BID at SNF).   Tylenol 664m PO q8h scheduled  Palliative Prophylaxis:   Aspiration, Delirium Protocol, Frequent Pain Assessment, Oral Care and Turn Reposition  Additional Recommendations (Limitations, Scope, Preferences):  DNR. Continue medical management.   Psycho-social/Spiritual:   Desire for further Chaplaincy support:no  Additional Recommendations: Caregiving  Support/Resources and Education on Hospice  Prognosis:   Unable to determine: likely less than 6 months and hospice eligible due to recurrent hospitalizations secondary to falls and UTI's. Underlying dementia with declining functional, nutritional, and cognitive status.   Discharge Planning: To Be Determined      Primary Diagnoses: Present on Admission: . Vitamin D deficiency . GERD (gastroesophageal reflux disease) . HLD (hyperlipidemia) . HTN (hypertension) . CKD (chronic kidney disease), stage III ([place] . Fall . UTI (urinary tract infection)   I have reviewed the medical record, interviewed the patient and  family, and examined the patient. The following aspects are pertinent.  Past Medical History:  Diagnosis Date  . Constipation   . GERD (gastroesophageal reflux disease)   . Glaucoma   . History of carotid artery disease   . Hyperlipidemia   . Hypertension   . UTI (urinary tract infection)   . Vertigo    Social History   Socioeconomic History  . Marital status: Widowed    Spouse name: Not on file  . Number of children: Not on file  . Years of education: Not on file  . Highest education level: Not on file  Occupational History  . Not on file  Social Needs  . Financial resource strain: Not on file  . Food insecurity:    Worry: Not on file    Inability: Not on file  . Transportation needs:    Medical: Not on file    Non-medical: Not on file  Tobacco Use  . Smoking status: Never Smoker  . Smokeless tobacco: Never Used  Substance and Sexual Activity  . Alcohol use: Not Currently    Frequency: Never  . Drug use: Never  . Sexual activity: Not Currently  Lifestyle  . Physical activity:  Days per week: Not on file    Minutes per session: Not on file  . Stress: Not on file  Relationships  . Social connections:    Talks on phone: Not on file    Gets together: Not on file    Attends religious service: Not on file    Active member of club or organization: Not on file    Attends meetings of clubs or organizations: Not on file    Relationship status: Not on file  Other Topics Concern  . Not on file  Social History Narrative  . Not on file   History reviewed. No pertinent family history. Scheduled Meds: . acetaminophen (TYLENOL) oral liquid 160 mg/5 mL  650 mg Oral Q8H  . brimonidine  1 drop Left Eye TID   Or  . dorzolamide  1 drop Left Eye TID  . enoxaparin (LOVENOX) injection  30 mg Subcutaneous Q24H  . ipratropium-albuterol  3 mL Nebulization Q8H  . irbesartan  37.5 mg Oral QHS  . latanoprost  1 drop Left Eye QHS  . pantoprazole  40 mg Oral BID  . polyethylene  glycol  17 g Oral Daily   Continuous Infusions: . sodium chloride 100 mL/hr at 03/08/18 0718  . [START ON 03/09/2018] ceFEPime (MAXIPIME) IV     PRN Meds:.guaiFENesin-dextromethorphan, HYDROcodone-acetaminophen, meclizine, morphine injection, ondansetron **OR** ondansetron (ZOFRAN) IV Medications Prior to Admission:  Prior to Admission medications   Medication Sig Start Date End Date Taking? Authorizing Provider  acetaminophen (TYLENOL) 325 MG tablet Take 650 mg by mouth See admin instructions. 664m by mouth three times daily at 0800, 1400, 2200 - may also take 6513mby mouth twice daily as needed for pain   Yes [provider]  Amino Acids-Protein Hydrolys (FEEDING SUPPLEMENT, PRO-STAT SUGAR FREE 64,) LIQD Take 30 mLs by mouth daily.   Yes [provider]  Artificial Tear Solution (GENTEAL TEARS) 0.1-0.2-0.3 % SOLN Place 1-2 drops into the left eye 2 (two) times daily as needed (itching).   Yes [provider]  Brimonidine-Dorzolamide 0.15-2 % SOLN Place 1 drop into the left eye 3 (three) times daily.   Yes [provider]  Dextromethorphan-guaiFENesin (ROBITUSSIN COUGH+CHEST CONG DM) 5-100 MG/5ML LIQD Take 15 mLs by mouth 3 (three) times daily.   Yes [provider]  docusate sodium (COLACE) 100 MG capsule Take 100 mg by mouth at bedtime.   Yes [provider]  dorzolamide (TRUSOPT) 2 % ophthalmic solution Place 1 drop into the left eye 3 (three) times daily.   Yes [provider]  enoxaparin (LOVENOX) 40 MG/0.4ML injection Inject 0.4 mLs (40 mg total) into the skin daily. 01/28/18  Yes XuLeandrew KoyanagiMD  HYDROcodone-acetaminophen (NORCO) 7.5-325 MG tablet Take 1-2 tablets by mouth every 6 (six) hours as needed for moderate pain. 01/28/18  Yes XuLeandrew KoyanagiMD  ipratropium-albuterol (DUONEB) 0.5-2.5 (3) MG/3ML SOLN Take 3 mLs by nebulization 2 (two) times daily.   Yes [provider]  irbesartan (AVAPRO) 75 MG tablet Take  37.5 mg by mouth at bedtime. TAKES 1/2 TABLET   Yes [provider]  meclizine (ANTIVERT) 12.5 MG tablet Take 12.5 mg by mouth 3 (three) times daily as needed for dizziness.   Yes [provider]  pantoprazole (PROTONIX) 40 MG tablet Take 1 tablet (40 mg total) by mouth 2 (two) times daily. 01/24/18  Yes Purohit, ShKonrad DoloresMD  polyethylene glycol (MIRALAX / GLYCOLAX) packet Take 17 g by mouth daily. 01/08/18  Yes Patrecia Pour, Christean Grief, MD  senna (SENOKOT) 8.6 MG TABS tablet Take 1 tablet by mouth 2 (two) times daily.   Yes [provider]  travoprost, benzalkonium, (TRAVATAN) 0.004 % ophthalmic solution Place 1 drop into the left eye at bedtime.   Yes [provider]   Allergies  Allergen Reactions  . Bactrim [Sulfamethoxazole-Trimethoprim] Other (See Comments)    unknown  . Levaquin [Levofloxacin] Other (See Comments)    unknown   Review of Systems  Unable to perform ROS Constitutional: Positive for activity change and appetite change.       Fall  Respiratory: Positive for cough.   Neurological: Positive for weakness.   Physical Exam  Constitutional: She is easily aroused. She appears ill.  Pulmonary/Chest: No accessory muscle usage. No tachypnea. No respiratory distress.  Neurological: She is alert and easily aroused.  Periods of pleasant confusion  Skin: Skin is warm and dry.  Nursing note and vitals reviewed.  Vital Signs: BP (!) 162/62 (BP Location: Left Arm)   Pulse 81   Temp 99 F (37.2 C) (Oral)   Resp 18   Ht 5' 3" (1.6 m)   Wt 68 kg (150 lb)   SpO2 (!) 86%   BMI 26.57 kg/m  Pain Scale: 0-10   Pain Score: 0-No pain  SpO2: SpO2: (!) 86 % O2 Device:SpO2: (!) 86 % O2 Flow Rate: .   IO: Intake/output summary:   Intake/Output Summary (Last 24 hours) at 03/08/2018 1557 Last data filed at 03/08/2018 1056 Gross per 24 hour  Intake 360 ml  Output -  Net 360 ml    LBM:   Baseline Weight: Weight: 67.1 kg (148 lb) Most recent weight:  Weight: 68 kg (150 lb)     Palliative Assessment/Data: PPS 40%   Flowsheet Rows     Most Recent Value  Intake Tab  Referral Department  Hospitalist  Unit at Time of Referral  Med/Surg Unit  Palliative Care Primary Diagnosis  -- [Falls, recurrent UTI's, dementia]  Palliative Care Type  New Palliative care  Reason for referral  Clarify Goals of Care  Date first seen by Palliative Care  03/08/18  Clinical Assessment  Palliative Performance Scale Score  40%  Psychosocial & Spiritual Assessment  Palliative Care Outcomes  Patient/Family meeting held?  Yes  Who was at the meeting?  two sons and DIL  Palliative Care Outcomes  Clarified goals of care, Provided end of life care assistance, Counseled regarding hospice, Provided psychosocial or spiritual support, ACP counseling assistance, Improved pain interventions, Improved non-pain symptom therapy      Time In: 1400 Time Out: 1515 Time Total: 51mn Greater than 50%  of this time was spent counseling and coordinating care related to the above assessment and plan.  Signed by:  MIhor Dow FNP-C Palliative Medicine Team  Phone: [phone]: [phone]  Please contact Palliative Medicine Team phone at [phone] questions and concerns.  For individual provider: See AShea Evans

## 2018-03-09 DIAGNOSIS — W19XXXD Unspecified fall, subsequent encounter: Secondary | ICD-10-CM

## 2018-03-09 DIAGNOSIS — S32512A Fracture of superior rim of left pubis, initial encounter for closed fracture: Secondary | ICD-10-CM

## 2018-03-09 LAB — URINE CULTURE

## 2018-03-09 MED ORDER — IPRATROPIUM-ALBUTEROL 0.5-2.5 (3) MG/3ML IN SOLN: 3.0000 mL | RESPIRATORY_TRACT | Status: DC | PRN

## 2018-03-09 NOTE — Clinical Social Work Note (Signed)
Clinical Social Work Assessment  Patient Details  Name: FATIN BACHICHA MRN: [id number] Date of Birth: 11/30/1919  Date of referral:  03/09/18               Reason for consult:  Discharge Planning                Permission sought to share information with:  Facility Industrial/product designer granted to share information::  No  Name::     Mining engineer::  [place]  Relationship::  Son  Contact Information:  [phone]  Housing/Transportation Living arrangements for the past 2 months:  Single Family Home, Skilled Nursing Facility Source of Information:  Adult Children Patient Interpreter Needed:  None Criminal Activity/Legal Involvement Pertinent to Current Situation/Hospitalization:  No - Comment as needed Significant Relationships:  Adult Children Lives with:  Facility Resident Do you feel safe going back to the place where you live?  Yes Need for family participation in patient care:  Yes (Comment)  Care giving concerns:  CSW received regarding discharge planning. CSW spoke with patient's son, Moshe Cipro. He reported that patient has been at [facility] Rehab since April. CSW to continue to follow and assist with discharge planning needs.   Social Worker assessment / plan:  CSW spoke with patient's son concerning possibility of remaining at rehab at [place].  Employment status:  Retired Health and safety inspector:  Medicare PT Recommendations:  Not assessed at this time Information / Referral to community resources:  Skilled Nursing Facility  Patient/Family's Response to care:  Patient's son questioned if patient could have Hospice at [place]. CSW confirmed that patient could have Hospice at [facility], which would meant that patient would no longer receive rehab and would be moved to a semi-private room due to Medicaid being the payer source. He questioned if patient would do better at a Memory Care facility. CSW explained that it takes time to locate a facility that has  availability and accepts Medicaid. Also, patient may not qualify physically for Memory Care if she requires 24 hour care. Patient's son will contact CSW tomorrow to confirm plan.   Patient/Family's Understanding of and Emotional Response to Diagnosis, Current Treatment, and Prognosis:  Patient/family is realistic regarding therapy needs and expressed being hopeful for return to SNF placement with Hospice. Patient's son expressed understanding of CSW role and discharge process as well as medical condition. No questions/concerns about plan or treatment.    Emotional Assessment Appearance:  Appears stated age Attitude/Demeanor/Rapport:  Unable to Assess Affect (typically observed):  Unable to Assess Orientation:  Oriented to Self, Oriented to Place, Oriented to Situation Alcohol / Substance use:  Not Applicable Psych involvement (Current and /or in the community):  No (Comment)  Discharge Needs  Concerns to be addressed:  Care Coordination Readmission within the last 30 days:  No Current discharge risk:  None Barriers to Discharge:  Continued Medical Work up   [employer], LCSWA 03/09/2018, 3:31 PM

## 2018-03-09 NOTE — Progress Notes (Signed)
PROGRESS NOTE  Donna Summers ZOX:[id number] DOB: 06-Apr-1920 DOA: 03/07/2018 PCP: [name], MD  HPI/Recap of past 16 hours: 84 year old with a history of CKD, dementia, hypertension, hyperlipidemia was brought from skilled nursing facility after sustaining an unwitnessed fall.  History is somewhat been unclear that she does not remember much due to her dementia but states typically she keeps bumping into things and had couple of falls previously with minor head trauma without any intracranial bleeding.  Over here she was diagnosed with urinary tract infection, mild leukocytosis and fever of 101 therefore started on IV Rocephin.  Due to her falls PT/OT were consulted.  She was also noted to have fracture of the right wrist therefore splint was placed.  03/09/2018: Patient seen and examined at bedside.  She has no new complaints unable to obtain a history due to her dementia.  Assessment/Plan: Active Problems:   History of urinary retention   Vitamin D deficiency   HLD (hyperlipidemia)   GERD (gastroesophageal reflux disease)   HTN (hypertension)   CKD (chronic kidney disease), stage III (HCC)   Glaucoma   Fall   UTI (urinary tract infection)   Palliative care by specialist   Goals of care, counseling/discussion   Shortness of breath   Generalized pain  Ambulatory dysfunction/multiple falls PT to assess and treat Fall precautions  Right wrist fracture secondary to fall In the splint Appears stable-patient has no complaints Pain management and bowel regimen in place  Pseudomonas aeruginosa UTI, POA Continue cefepime Cutdown fluid to 75 cc/h Encourage oral fluid intake Monitor urine output Monitor fever curve Monitor CBC Blood cultures x2- to date  Uncontrolled hypertension Continue Irbesartan Add low-dose amlodipine 5 mg daily Control pain  GERD Continue Protonix p.o. twice daily  Dementia Reorient as needed Fall precautions    Code Status: DNR  Family  Communication: None at bedside  Disposition Plan: SNF when hemodynamically stable   Consultants:  None  Procedures:  None  Antimicrobials:  IV cefepime  DVT prophylaxis: Subcu Lovenox   Objective: Vitals:   03/08/18 2032 03/09/18 0439 03/09/18 0442 03/09/18 0547  BP: 140/64  (!) 180/62 (!) 160/65  Pulse: 77  76   Resp: 18  18   Temp: 98.9 F (37.2 C)  99.4 F (37.4 C)   TempSrc: Oral  Oral   SpO2: 91%  93%   Weight:  67.6 kg (149 lb)    Height:        Intake/Output Summary (Last 24 hours) at 03/09/2018 1228 Last data filed at 03/09/2018 1100 Gross per 24 hour  Intake 2631.33 ml  Output 300 ml  Net 2331.33 ml   Filed Weights   03/07/18 1858 03/08/18 0500 03/09/18 0439  Weight: 67.1 kg (148 lb) 68 kg (150 lb) 67.6 kg (149 lb)    Exam:  . General: 84 y.o. year-old female well developed well nourished in no acute distress.  Alert in the setting of dementia . Cardiovascular: Regular rate and rhythm with no rubs or gallops.  No thyromegaly or JVD noted.   Marland Kitchen Respiratory: Clear to auscultation with no wheezes or rales. Good inspiratory effort. . Abdomen: Soft nontender nondistended with normal bowel sounds x4 quadrants. . Musculoskeletal: No lower extremity edema. 2/4 pulses in all 4 extremities.  Right wrist in splint. . Skin: No ulcerative lesions noted or rashes, . Psychiatry: Mood is appropriate for condition and setting   Data Reviewed: CBC: Recent Labs  Lab 03/07/18 0944 03/08/18 0319  WBC 14.2* 9.5  NEUTROABS 11.4*  --   HGB 11.7* 10.4*  HCT 37.6 33.9*  MCV 84.7 85.0  PLT 314 304   Basic Metabolic Panel: Recent Labs  Lab 03/07/18 0944 03/08/18 0319  NA 140 139  K 3.9 3.5  CL 106 107  CO2 23 23  GLUCOSE 94 91  BUN 13 14  CREATININE 1.20* 1.12*  CALCIUM 9.4 8.7*   GFR: Estimated Creatinine Clearance: 27.1 mL/min (A) (by C-G formula based on SCr of 1.12 mg/dL (H)). Liver Function Tests: Recent Labs  Lab 03/07/18 0944  AST 23  ALT  12*  ALKPHOS 88  BILITOT 0.8  PROT 6.4*  ALBUMIN 3.2*   No results for input(s): LIPASE, AMYLASE in the last 168 hours. No results for input(s): AMMONIA in the last 168 hours. Coagulation Profile: No results for input(s): INR, PROTIME in the last 168 hours. Cardiac Enzymes: No results for input(s): CKTOTAL, CKMB, CKMBINDEX, TROPONINI in the last 168 hours. BNP (last 3 results) No results for input(s): PROBNP in the last 8760 hours. HbA1C: No results for input(s): HGBA1C in the last 72 hours. CBG: No results for input(s): GLUCAP in the last 168 hours. Lipid Profile: No results for input(s): CHOL, HDL, LDLCALC, TRIG, CHOLHDL, LDLDIRECT in the last 72 hours. Thyroid Function Tests: No results for input(s): TSH, T4TOTAL, FREET4, T3FREE, THYROIDAB in the last 72 hours. Anemia Panel: No results for input(s): VITAMINB12, FOLATE, FERRITIN, TIBC, IRON, RETICCTPCT in the last 72 hours. Urine analysis:    Component Value Date/Time   COLORURINE YELLOW 03/07/2018 1236   APPEARANCEUR HAZY (A) 03/07/2018 1236   LABSPEC 1.010 03/07/2018 1236   PHURINE 6.0 03/07/2018 1236   GLUCOSEU NEGATIVE 03/07/2018 1236   HGBUR SMALL (A) 03/07/2018 1236   BILIRUBINUR NEGATIVE 03/07/2018 1236   KETONESUR NEGATIVE 03/07/2018 1236   PROTEINUR NEGATIVE 03/07/2018 1236   NITRITE NEGATIVE 03/07/2018 1236   LEUKOCYTESUR LARGE (A) 03/07/2018 1236   Sepsis Labs: (procalcitonin:4,lacticidven:4)  ) Recent Results (from the past 240 hour(s))  Blood culture (routine x 2)     Status: None (Preliminary result)   Collection Time: 03/07/18 11:58 AM  Result Value Ref Range Status   Specimen Description BLOOD RIGHT ANTECUBITAL  Final   Special Requests   Final    BOTTLES DRAWN AEROBIC AND ANAEROBIC Blood Culture adequate volume   Culture   Final    NO GROWTH 1 DAY Performed at [facility], [address], [place], [place] [address]    Report Status PENDING  Incomplete  Urine culture      Status: Abnormal   Collection Time: 03/07/18 12:36 PM  Result Value Ref Range Status   Specimen Description URINE, RANDOM  Final   Special Requests   Final    NONE Performed at [facility], [address], [place], [place] [address]    Culture >=100,000 COLONIES/mL PSEUDOMONAS AERUGINOSA (A)  Final   Report Status 03/09/2018 FINAL  Final   Organism ID, Bacteria PSEUDOMONAS AERUGINOSA (A)  Final      Susceptibility   Pseudomonas aeruginosa - MIC*    CEFTAZIDIME 2 SENSITIVE Sensitive     CIPROFLOXACIN <=0.25 SENSITIVE Sensitive     GENTAMICIN <=1 SENSITIVE Sensitive     IMIPENEM 1 SENSITIVE Sensitive     PIP/TAZO <=4 SENSITIVE Sensitive     CEFEPIME <=1 SENSITIVE Sensitive     * >=100,000 COLONIES/mL PSEUDOMONAS AERUGINOSA  Blood culture (routine x 2)     Status: None (Preliminary result)   Collection  Time: 03/07/18 12:45 PM  Result Value Ref Range Status   Specimen Description BLOOD LEFT FOREARM  Final   Special Requests   Final    BOTTLES DRAWN AEROBIC AND ANAEROBIC Blood Culture adequate volume   Culture   Final    NO GROWTH 1 DAY Performed at [facility], [address] [place], [place] [address]    Report Status PENDING  Incomplete      Studies: No results found.  Scheduled Meds: . acetaminophen (TYLENOL) oral liquid 160 mg/5 mL  650 mg Oral Q8H  . brimonidine  1 drop Left Eye TID   Or  . dorzolamide  1 drop Left Eye TID  . enoxaparin (LOVENOX) injection  30 mg Subcutaneous Q24H  . irbesartan  37.5 mg Oral QHS  . latanoprost  1 drop Left Eye QHS  . pantoprazole  40 mg Oral BID  . polyethylene glycol  17 g Oral Daily    Continuous Infusions: . sodium chloride 100 mL/hr at 03/09/18 1004  . ceFEPime (MAXIPIME) IV Stopped (03/09/18 1009)     LOS: 1 day     Darlin Drop, MD [facility] Pager [phone]  If 7PM-7AM, please contact night-coverage [email or web address] Password [facility] 03/09/2018, 12:28 PM

## 2018-03-09 NOTE — Progress Notes (Signed)
Daily Progress Note   Patient Name: Donna Summers       Date: 03/09/2018 DOB: [birth year]  Age: 84 y.o. MRN#: [id number] Attending Physician: Darlin Drop, DO Primary Care Physician: [name], MD Admit Date: 03/07/2018  Reason for Consultation/Follow-up: Establishing goals of care  Subjective: Patient will wake to voice but very drowsy this afternoon. Refuses food or drink. Denies pain.   GOC: Son, Donna Summers at bedside. Again discussed hospital diagnoses and interventions. He speaks of her refusing to eat all day. Explained that she will likely continue to wax and wane. Donna Summers is waiting for social work to contact him regarding placement when discharged. We confirmed plan for hospice services at SNF on discharge. He remains hopeful that ortho can follow up while she is hospitalized.   Length of Stay: 1  Current Medications: Scheduled Meds:  . acetaminophen (TYLENOL) oral liquid 160 mg/5 mL  650 mg Oral Q8H  . brimonidine  1 drop Left Eye TID   Or  . dorzolamide  1 drop Left Eye TID  . enoxaparin (LOVENOX) injection  30 mg Subcutaneous Q24H  . irbesartan  37.5 mg Oral QHS  . latanoprost  1 drop Left Eye QHS  . pantoprazole  40 mg Oral BID  . polyethylene glycol  17 g Oral Daily    Continuous Infusions: . sodium chloride 75 mL/hr at 03/09/18 1317  . ceFEPime (MAXIPIME) IV Stopped (03/09/18 1009)    PRN Meds: guaiFENesin-dextromethorphan, HYDROcodone-acetaminophen, ipratropium-albuterol, meclizine, morphine injection, ondansetron **OR** ondansetron (ZOFRAN) IV  Physical Exam  Constitutional: She is easily aroused.  HENT:  Head: Normocephalic and atraumatic.  Pulmonary/Chest: No accessory muscle usage. No tachypnea. No respiratory distress.  Neurological: She is easily  aroused.  Wakes to voice, drowsy  Skin: Skin is warm and dry.  Psychiatric: Her speech is delayed. She is inattentive.  Nursing note and vitals reviewed.          Vital Signs: BP (!) 160/65 (BP Location: Left Arm)   Pulse 76   Temp 99.4 F (37.4 C) (Oral)   Resp 18   Ht  (1.6 m)   Wt 67.6 kg (149 lb)   SpO2 93%   BMI 26.39 kg/m  SpO2: SpO2: 93 % O2 Device: O2 Device: Room Air O2 Flow Rate: O2 Flow Rate (L/min): 2  L/min  Intake/output summary:   Intake/Output Summary (Last 24 hours) at 03/09/2018 1411 Last data filed at 03/09/2018 1100 Gross per 24 hour  Intake 2631.33 ml  Output 300 ml  Net 2331.33 ml   LBM:   Baseline Weight: Weight: 67.1 kg (148 lb) Most recent weight: Weight: 67.6 kg (149 lb)       Palliative Assessment/Data: PPS 40%   Flowsheet Rows     Most Recent Value  Intake Tab  Referral Department  Hospitalist  Unit at Time of Referral  Med/Surg Unit  Palliative Care Primary Diagnosis  -- [Falls, recurrent UTI's, dementia]  Palliative Care Type  New Palliative care  Reason for referral  Clarify Goals of Care  Date first seen by Palliative Care  03/08/18  Clinical Assessment  Palliative Performance Scale Score  40%  Psychosocial & Spiritual Assessment  Palliative Care Outcomes  Patient/Family meeting held?  Yes  Who was at the meeting?  two sons and DIL  Palliative Care Outcomes  Clarified goals of care, Provided end of life care assistance, Counseled regarding hospice, Provided psychosocial or spiritual support, ACP counseling assistance, Improved pain interventions, Improved non-pain symptom therapy      Patient Active Problem List   Diagnosis Date Noted  . UTI (urinary tract infection) 03/08/2018  . Palliative care by specialist   . Goals of care, counseling/discussion   . Shortness of breath   . Generalized pain   . Glaucoma 03/07/2018  . Fall 03/07/2018  . History of left hip hemiarthroplasty 02/11/2018  . Pressure injury of skin  02/01/2018  . Nausea and vomiting in adult   . Closed left hip fracture, initial encounter (HCC) 01/28/2018  . CKD (chronic kidney disease), stage III (HCC) 01/28/2018  . Hypertensive urgency   . Dysphagia 01/20/2018  . Acute lower UTI 01/20/2018  . Dehydration 01/20/2018  . Weakness generalized 01/20/2018  . Symptomatic cholelithiasis 01/19/2018  . Cholelithiasis 01/19/2018  . Cholecystitis 01/06/2018  . Constipation 01/06/2018  . History of urinary retention 01/06/2018  . Abdominal pain 01/06/2018  . Vitamin D deficiency 01/06/2018  . HLD (hyperlipidemia) 01/06/2018  . GERD (gastroesophageal reflux disease) 01/06/2018  . HTN (hypertension) 01/06/2018    Palliative Care Assessment & Plan   Patient Profile: 84 y.o. female  with past medical history recurrent UTI's, HTN, HLD, CAD, glaucoma, GERD, dementia admitted on 03/07/2018 with unwitnessed fall. Recent hospitalization after fall with left hip fracture s/p hip arthroplasty. In ED, wrist xray reveals mildly impacted fracture of distal radial metaphysis (negative per one week ago per family). CT head/spine negative. Chest xray negative for acute changes. UA positive for UTI. Receiving IV antibiotics and IVF. Palliative medicine consultation for goals of care.   Assessment: Recurrent falls Pseudomonas aeruginosa UTI Right wrist fracture Dementia Recent hip fracture s/p hip arthroplasty   Recommendations/Plan:  DNR/DNI. Family confirms patient wishes against heroic measures at EOL.   Continue current medical management and interventions.   Family goals are "comfort and safety." Interested in hospice services at SNF when discharged. SW consulted to help with disposition plan.   Family wondering if ortho f/u can be done inpatient. Will discuss with attending.   PMT will follow.   Code Status: DNR   Code Status Orders  (From admission, onward)        Start     Ordered   03/07/18 1354  Do not attempt resuscitation  (DNR)  Continuous    Question Answer Comment  In the event of cardiac or respiratory  ARREST Do not call a "code blue"   In the event of cardiac or respiratory ARREST Do not perform Intubation, CPR, defibrillation or ACLS   In the event of cardiac or respiratory ARREST Use medication by any route, position, wound care, and other measures to relive pain and suffering. May use oxygen, suction and manual treatment of airway obstruction as needed for comfort.      03/07/18 1356    Code Status History    Date Active Date Inactive Code Status Order ID Comments User Context   01/28/2018 0208 02/02/2018 1651 DNR [id number]  Briscoe Deutscher, MD ED   01/19/2018 2003 01/24/2018 1811 DNR [id number]  Pearson Grippe, MD Inpatient   01/06/2018 1523 01/08/2018 2047 DNR [id number]  Dimple Nanas, MD ED    Advance Directive Documentation     Most Recent Value  Type of Advance Directive  Healthcare Power of Attorney  Pre-existing out of facility DNR order (yellow form or pink MOST form)  -  "MOST" Form in Place?  -       Prognosis:   Unable to determine: likely less than 6 months and hospice eligible due to recurrent hospitalizations secondary to falls and UTI's. Underlying dementia with declining functional, nutritional and cognitive status.   Discharge Planning:  To Be Determined Likely SNF with hospice services.   Care plan was discussed with patient, son Donna Summers), Dr. Margo Aye  Thank you for allowing the Palliative Medicine Team to assist in the care of this patient.   Time In: 1320 Time Out: 1345 Total Time Prolonged Time Billed  no      Greater than 50%  of this time was spent counseling and coordinating care related to the above assessment and plan.  Vennie Homans, FNP-C Palliative Medicine Team  Phone: [phone] Fax: [phone]  Please contact Palliative Medicine Team phone at [phone] for questions and concerns.

## 2018-03-10 LAB — MRSA PCR SCREENING: MRSA by PCR: NEGATIVE

## 2018-03-10 MED ORDER — AMLODIPINE BESYLATE 5 MG PO TABS
5.0000 mg | ORAL_TABLET | Freq: Every day | ORAL | Status: DC
  Administered 2018-03-10: 5 mg via ORAL
  Filled 2018-03-10: qty 1

## 2018-03-10 MED ORDER — AMLODIPINE BESYLATE 10 MG PO TABS: 10.0000 mg | ORAL_TABLET | Freq: Every day | ORAL | Status: DC

## 2018-03-10 MED ORDER — AMLODIPINE BESYLATE 10 MG PO TABS: 10.0000 mg | ORAL_TABLET | Freq: Every day | ORAL | 0 refills | Status: AC

## 2018-03-10 NOTE — Discharge Summary (Addendum)
Discharge Summary  Donna Summers:[id number] DOB: 18-Jan-1920  PCP: [name], MD  Admit date: 03/07/2018 Discharge date: 03/10/2018  Time spent: 25 minutes  Recommendations for Outpatient Follow-up:  1. Palliative care/Family requests Hospice 2. Take your medications as prescribed  Discharge Diagnoses:  Active Hospital Problems   Diagnosis Date Noted  . UTI (urinary tract infection) 03/08/2018  . Palliative care by specialist   . Goals of care, counseling/discussion   . Shortness of breath   . Generalized pain   . Glaucoma 03/07/2018  . Fall 03/07/2018  . CKD (chronic kidney disease), stage III (HCC) 01/28/2018  . History of urinary retention 01/06/2018  . Vitamin D deficiency 01/06/2018  . GERD (gastroesophageal reflux disease) 01/06/2018  . HLD (hyperlipidemia) 01/06/2018  . HTN (hypertension) 01/06/2018    Resolved Hospital Problems  No resolved problems to display.    Discharge Condition: Stable  Diet recommendation: Resume previous diet  Vitals:   03/10/18 0927 03/10/18 1039  BP: (!) 196/62 (!) 167/44  Pulse:  79  Resp:    Temp:    SpO2:      History of present illness:  84 year old with a history of CKD, dementia, hypertension, hyperlipidemia was brought from skilled nursing facility after sustaining an unwitnessed fall. History is somewhat been unclear that she does not remember much due to her dementia but states typically she keeps bumping into things and had couple of falls previously with minor head trauma without any intracranial bleeding. Over here she was diagnosed with urinary tract infection, mild leukocytosis and fever of 101 therefore started on IV Rocephin. Due to her falls PT/OT were consulted. She was also noted to have fracture of the right wrist therefore splint was placed.  03/09/2018: Patient seen and examined at bedside.  She has no new complaints unable to obtain a history due to possible mild dementia.  03/10/2018: Patient  seen and examined her bedside.  She has no complaints of pain.  She denies pain in her right arm which is in a splint.  She is alert and oriented x3.  Afebrile with no leukocytosis.  On the day of discharge patient was hemodynamically stable.  Recommend follow-up outpatient with hand surgery Dr. Merlyn Lot as requested by family.  Hospital Course:  Active Problems:   History of urinary retention   Vitamin D deficiency   HLD (hyperlipidemia)   GERD (gastroesophageal reflux disease)   HTN (hypertension)   CKD (chronic kidney disease), stage III (HCC)   Glaucoma   Fall   UTI (urinary tract infection)   Palliative care by specialist   Goals of care, counseling/discussion   Shortness of breath   Generalized pain  Ambulatory dysfunction/multiple falls PT to assess and treat Fall precautions  Right wrist fracture secondary to fall In the splint Appears stable-patient has no complaints Patient denies any pain in her right wrist.  Pseudomonas aeruginosa UTI, POA Completed 3 days of cefepime IV Blood cultures negative to date  Hypertension Added amlodipine 10 mg daily Continue Irbesartan  GERD Continue Protonix p.o. twice daily  Goals of care Continue palliative care as requested by family  Moderate protein calorie malnutrition Abdomen 3.2 on 03/07/2018 Encourage increase of protein calorie intake   Procedures:  None  Consultations:  None  Discharge Exam: BP (!) 167/44 (BP Location: Left Arm)   Pulse 79   Temp 98.8 F (37.1 C)   Resp 17   Ht  (1.6 m)   Wt 66.2 kg (146 lb)  SpO2 94%   BMI 25.86 kg/m  . General: 84 y.o. year-old female well developed well nourished in no acute distress.  Alert and oriented x3. . Cardiovascular: Regular rate and rhythm with no rubs or gallops.  No thyromegaly or JVD noted.   Marland Kitchen Respiratory: Clear to auscultation with no wheezes or rales. Good inspiratory effort. . Abdomen: Soft nontender nondistended with normal bowel  sounds x4 quadrants. . Musculoskeletal: No lower extremity edema. 2/4 pulses in all 4 extremities. . Skin: No ulcerative lesions noted or rashes, . Psychiatry: Mood is appropriate for condition and setting  Discharge Instructions You were cared for by a hospitalist during your hospital stay. If you have any questions about your discharge medications or the care you received while you were in the hospital after you are discharged, you can call the unit and asked to speak with the hospitalist on call if the hospitalist that took care of you is not available. Once you are discharged, your primary care physician will handle any further medical issues. Please note that NO REFILLS for any discharge medications will be authorized once you are discharged, as it is imperative that you return to your primary care physician (or establish a relationship with a primary care physician if you do not have one) for your aftercare needs so that they can reassess your need for medications and monitor your lab values.   Allergies as of 03/10/2018      Reactions   Bactrim [sulfamethoxazole-trimethoprim] Other (See Comments)   unknown   Levaquin [levofloxacin] Other (See Comments)   unknown      Medication List    STOP taking these medications   acetaminophen 325 MG tablet Commonly known as:  TYLENOL   Brimonidine-Dorzolamide 0.15-2 % Soln   dorzolamide 2 % ophthalmic solution Commonly known as:  TRUSOPT   HYDROcodone-acetaminophen 7.5-325 MG tablet Commonly known as:  NORCO   senna 8.6 MG Tabs tablet Commonly known as:  SENOKOT     TAKE these medications   amLODipine 10 MG tablet Commonly known as:  NORVASC Take 1 tablet (10 mg total) by mouth daily. Start taking on:  03/11/2018   docusate sodium 100 MG capsule Commonly known as:  COLACE Take 100 mg by mouth at bedtime.   enoxaparin 40 MG/0.4ML injection Commonly known as:  LOVENOX Inject 0.4 mLs (40 mg total) into the skin daily.   feeding  supplement (PRO-STAT SUGAR FREE 64) Liqd Take 30 mLs by mouth daily.   GENTEAL TEARS 0.1-0.2-0.3 % Soln Place 1-2 drops into the left eye 2 (two) times daily as needed (itching).   ipratropium-albuterol 0.5-2.5 (3) MG/3ML Soln Commonly known as:  DUONEB Take 3 mLs by nebulization 2 (two) times daily.   irbesartan 75 MG tablet Commonly known as:  AVAPRO Take 37.5 mg by mouth at bedtime. TAKES 1/2 TABLET   meclizine 12.5 MG tablet Commonly known as:  ANTIVERT Take 12.5 mg by mouth 3 (three) times daily as needed for dizziness.   pantoprazole 40 MG tablet Commonly known as:  PROTONIX Take 1 tablet (40 mg total) by mouth 2 (two) times daily.   polyethylene glycol packet Commonly known as:  MIRALAX / GLYCOLAX Take 17 g by mouth daily.   ROBITUSSIN COUGH+CHEST CONG DM 5-100 MG/5ML Liqd Generic drug:  Dextromethorphan-guaiFENesin Take 15 mLs by mouth 3 (three) times daily.   travoprost (benzalkonium) 0.004 % ophthalmic solution Commonly known as:  TRAVATAN Place 1 drop into the left eye at bedtime.  Allergies  Allergen Reactions  . Bactrim [Sulfamethoxazole-Trimethoprim] Other (See Comments)    unknown  . Levaquin [Levofloxacin] Other (See Comments)    unknown   Follow-up Information    Cindee Salt, MD. Call.   Specialty:  Orthopedic Surgery Why:  please call to make an appointment. Contact information: [address] [place] [address] [phone]        [name], MD. Call in 1 day(s).   Specialty:  Internal Medicine Why:  please call to make an appointment. Contact information: [address] [place] [address] [phone]            The results of significant diagnostics from this hospitalization (including imaging, microbiology, ancillary and laboratory) are listed below for reference.    Significant Diagnostic Studies: Dg Lumbar Spine Complete  Result Date: 03/07/2018 CLINICAL DATA:  Fall 1 week ago.  Left low back  and hip pain. EXAM: LUMBAR SPINE - COMPLETE 4+ VIEW COMPARISON:  CT abdomen and pelvis 01/13/2018 FINDINGS: There are 5 non rib-bearing lumbar type vertebrae. Trace degenerative retrolisthesis of L2 on L3 and trace anterolisthesis of L4 on L5 are unchanged. Vertebral body heights are preserved. No fracture is identified. Severe disc space narrowing at L2-3 and moderate narrowing at L5-S1 are unchanged. Degenerative endplate sclerosis is noted at L2-3. the bones are diffusely osteopenic. A left hip arthroplasty is partially visualized. Gas is present diffusely throughout grossly nondilated small bowel. Extensive aortoiliac atherosclerosis is noted. IMPRESSION: Similar appearance of lumbar disc degeneration without evidence of acute osseous abnormality. Electronically Signed   By: Sebastian Ache M.D.   On: 03/07/2018 11:28   Dg Wrist Complete Right  Result Date: 03/07/2018 CLINICAL DATA:  Persistent right wrist tenderness after falling 1 week ago. Initial encounter. EXAM: RIGHT WRIST - COMPLETE 3+ VIEW COMPARISON:  None. FINDINGS: The bones are mildly demineralized. There is irregularity of the nonarticular cortex of the distal radius, especially on the lateral view, suspicious for a mildly impacted fracture. The distal ulna and carpal bones appear intact. There are mild degenerative changes in the radial aspect of the wrist and at the 4th PIP joint. There is some soft tissue swelling in the radial aspect of the distal forearm. IMPRESSION: Findings are consistent with a mildly impacted fracture of the distal radial metaphysis. Electronically Signed   By: Carey Bullocks M.D.   On: 03/07/2018 11:28   Ct Head Wo Contrast  Result Date: 03/07/2018 CLINICAL DATA:  Slipped in the hallway and fell striking posterior head, posterior head hematoma, loss of consciousness, posterior neck pain, on Lovenox, history hypertension, hyperlipidemia EXAM: CT HEAD WITHOUT CONTRAST CT CERVICAL SPINE WITHOUT CONTRAST TECHNIQUE:  Multidetector CT imaging of the head and cervical spine was performed following the standard protocol without intravenous contrast. Multiplanar CT image reconstructions of the cervical spine were also generated. COMPARISON:  CT head 01/27/2018 FINDINGS: CT HEAD FINDINGS Brain: Generalized atrophy. Normal ventricular morphology. No midline shift or mass effect. Old lacunar infarcts at external capsules. Otherwise normal appearance of brain parenchyma. No intracranial hemorrhage, mass lesion or evidence acute infarction. No extra-axial fluid collections. Vascular: Atherosclerotic calcifications of internal carotid arteries bilaterally at skull base. No hyperdense vessels. Skull: Demineralized but intact. Posterior scalp hematoma esp. RIGHT of midline Sinuses/Orbits: Small air-fluid level within sphenoid sinus. Scattered mucosal thickening in ethmoid air cells. Other: N/A CT CERVICAL SPINE FINDINGS Alignment: Normal Skull base and vertebrae: Diffuse demineralization. Visualized skull base intact. Vertebral body heights maintained. Multilevel disc space narrowing.  Endplate spur formation at C4-C5 through C6-C7. Scattered facet degenerative changes. No fracture, subluxation or bone destruction. Soft tissues and spinal canal: Prevertebral soft tissues normal thickness. Extensive atherosclerotic calcifications in the carotid systems bilaterally, proximal great vessels, and at aortic arch. Disc levels:  No additional abnormalities Upper chest: Lung apices clear Other: N/A IMPRESSION: Atrophy with small vessel chronic ischemic changes of deep cerebral white matter. Small old lacunar infarcts in the external capsules bilaterally. No acute intracranial abnormalities. Posterior scalp hematoma. Degenerative disc and facet disease changes of the cervical spine. No acute cervical spine abnormalities. Extensive atherosclerotic calcifications throughout the carotid systems, in the proximal great vessels and aortic arch. Aortic  Atherosclerosis (ICD10-I70.0). Electronically Signed   By: Ulyses Southward M.D.   On: 03/07/2018 11:10   Ct Cervical Spine Wo Contrast  Result Date: 03/07/2018 CLINICAL DATA:  Slipped in the hallway and fell striking posterior head, posterior head hematoma, loss of consciousness, posterior neck pain, on Lovenox, history hypertension, hyperlipidemia EXAM: CT HEAD WITHOUT CONTRAST CT CERVICAL SPINE WITHOUT CONTRAST TECHNIQUE: Multidetector CT imaging of the head and cervical spine was performed following the standard protocol without intravenous contrast. Multiplanar CT image reconstructions of the cervical spine were also generated. COMPARISON:  CT head 01/27/2018 FINDINGS: CT HEAD FINDINGS Brain: Generalized atrophy. Normal ventricular morphology. No midline shift or mass effect. Old lacunar infarcts at external capsules. Otherwise normal appearance of brain parenchyma. No intracranial hemorrhage, mass lesion or evidence acute infarction. No extra-axial fluid collections. Vascular: Atherosclerotic calcifications of internal carotid arteries bilaterally at skull base. No hyperdense vessels. Skull: Demineralized but intact. Posterior scalp hematoma esp. RIGHT of midline Sinuses/Orbits: Small air-fluid level within sphenoid sinus. Scattered mucosal thickening in ethmoid air cells. Other: N/A CT CERVICAL SPINE FINDINGS Alignment: Normal Skull base and vertebrae: Diffuse demineralization. Visualized skull base intact. Vertebral body heights maintained. Multilevel disc space narrowing. Endplate spur formation at C4-C5 through C6-C7. Scattered facet degenerative changes. No fracture, subluxation or bone destruction. Soft tissues and spinal canal: Prevertebral soft tissues normal thickness. Extensive atherosclerotic calcifications in the carotid systems bilaterally, proximal great vessels, and at aortic arch. Disc levels:  No additional abnormalities Upper chest: Lung apices clear Other: N/A IMPRESSION: Atrophy with small  vessel chronic ischemic changes of deep cerebral white matter. Small old lacunar infarcts in the external capsules bilaterally. No acute intracranial abnormalities. Posterior scalp hematoma. Degenerative disc and facet disease changes of the cervical spine. No acute cervical spine abnormalities. Extensive atherosclerotic calcifications throughout the carotid systems, in the proximal great vessels and aortic arch. Aortic Atherosclerosis (ICD10-I70.0). Electronically Signed   By: Ulyses Southward M.D.   On: 03/07/2018 11:10   Dg Chest Port 1 View  Result Date: 03/07/2018 CLINICAL DATA:  Pain after fall EXAM: PORTABLE CHEST 1 VIEW COMPARISON:  January 31, 2018 FINDINGS: Mild cardiomegaly, stable. The heart, hila, mediastinum, lungs, and pleura are otherwise unremarkable. IMPRESSION: No active disease. Electronically Signed   By: Gerome Sam III M.D   On: 03/07/2018 12:10   Dg Hip Unilat W Or Wo Pelvis 2-3 Views Left  Result Date: 03/07/2018 CLINICAL DATA:  Status post fall.  Left hip pain. EXAM: DG HIP (WITH OR WITHOUT PELVIS) 2-3V LEFT COMPARISON:  None. FINDINGS: Left hip arthroplasty without hardware failure or complication. On the AP view of the pelvis there is a subtle lucency involving the left inferior pubic ramus not seen on the coned-down view of the left hip or frog-leg lateral view likely artifactual. No other fracture or dislocation. Peripheral vascular  atherosclerotic disease. IMPRESSION: 1. Left hip arthroplasty without hardware failure or complication. 2. On the AP view of the pelvis there is a subtle lucency involving the left inferior pubic ramus not seen on the coned-down view of the left hip or frog-leg lateral view likely artifactual. If there is further clinical concern regarding an occult fracture, recommend a CT of the left hip. Electronically Signed   By: Elige Ko   On: 03/07/2018 11:28   Xr Hip Unilat W Or W/o Pelvis 2-3 Views Left  Result Date: 02/11/2018  a well-seated  prosthesis.  No evidence of fracture   Microbiology: Recent Results (from the past 240 hour(s))  Blood culture (routine x 2)     Status: None (Preliminary result)   Collection Time: 03/07/18 11:58 AM  Result Value Ref Range Status   Specimen Description BLOOD RIGHT ANTECUBITAL  Final   Special Requests   Final    BOTTLES DRAWN AEROBIC AND ANAEROBIC Blood Culture adequate volume   Culture   Final    NO GROWTH 2 DAYS Performed at [facility], [address] [place], [place] [address]    Report Status PENDING  Incomplete  Urine culture     Status: Abnormal   Collection Time: 03/07/18 12:36 PM  Result Value Ref Range Status   Specimen Description URINE, RANDOM  Final   Special Requests   Final    NONE Performed at [facility], [address], [place], [place] [address]    Culture >=100,000 COLONIES/mL PSEUDOMONAS AERUGINOSA (A)  Final   Report Status 03/09/2018 FINAL  Final   Organism ID, Bacteria PSEUDOMONAS AERUGINOSA (A)  Final      Susceptibility   Pseudomonas aeruginosa - MIC*    CEFTAZIDIME 2 SENSITIVE Sensitive     CIPROFLOXACIN <=0.25 SENSITIVE Sensitive     GENTAMICIN <=1 SENSITIVE Sensitive     IMIPENEM 1 SENSITIVE Sensitive     PIP/TAZO <=4 SENSITIVE Sensitive     CEFEPIME <=1 SENSITIVE Sensitive     * >=100,000 COLONIES/mL PSEUDOMONAS AERUGINOSA  Blood culture (routine x 2)     Status: None (Preliminary result)   Collection Time: 03/07/18 12:45 PM  Result Value Ref Range Status   Specimen Description BLOOD LEFT FOREARM  Final   Special Requests   Final    BOTTLES DRAWN AEROBIC AND ANAEROBIC Blood Culture adequate volume   Culture   Final    NO GROWTH 2 DAYS Performed at [facility], [address] [place], [place] [address]    Report Status PENDING  Incomplete     Labs: Basic Metabolic Panel: Recent Labs  Lab 03/07/18 0944 03/08/18 0319  NA 140 139  K 3.9 3.5  CL 106 107  CO2 23 23  GLUCOSE 94 91  BUN 13 14  CREATININE 1.20*  1.12*  CALCIUM 9.4 8.7*   Liver Function Tests: Recent Labs  Lab 03/07/18 0944  AST 23  ALT 12*  ALKPHOS 88  BILITOT 0.8  PROT 6.4*  ALBUMIN 3.2*   No results for input(s): LIPASE, AMYLASE in the last 168 hours. No results for input(s): AMMONIA in the last 168 hours. CBC: Recent Labs  Lab 03/07/18 0944 03/08/18 0319  WBC 14.2* 9.5  NEUTROABS 11.4*  --   HGB 11.7* 10.4*  HCT 37.6 33.9*  MCV 84.7 85.0  PLT 314 304   Cardiac Enzymes: No results for input(s): CKTOTAL, CKMB, CKMBINDEX, TROPONINI in the last 168 hours. BNP: BNP (last 3 results)  No results for input(s): BNP in the last 8760 hours.  ProBNP (last 3 results) No results for input(s): PROBNP in the last 8760 hours.  CBG: No results for input(s): GLUCAP in the last 168 hours.     Signed:  Darlin Drop, MD [facility] 03/10/2018, 11:20 AM

## 2018-03-10 NOTE — Evaluation (Signed)
Physical Therapy Evaluation & Discharge Patient Details Name: Donna Summers MRN: [id number] DOB: 1920/01/08 Today's Date: 03/10/2018   History of Present Illness  84 year old with a history of CKD, dementia, hypertension, hyperlipidemia was brought from skilled nursing facility after sustaining an unwitnessed fall.  Question R wrist fx and previous history of fall a month ago with L hip fx s/p hemiarthroplasty (WBAT).  Clinical Impression  Patient able to participate in activity this session for OOB to chair with +2 A.  Feel she is appropriate to return to SNF with 24 hour assist for comfort and safety as noted GOC per Palliative team.  PT to sign off as planned d/c later today or tomorrow.  RN made aware.    Follow Up Recommendations SNF    Equipment Recommendations  None recommended by PT    Recommendations for Other Services       Precautions / Restrictions Precautions Precautions: Fall Restrictions Other Position/Activity Restrictions: WBAT      Mobility  Bed Mobility Overal bed mobility: Needs Assistance Bed Mobility: Supine to Sit     Supine to sit: Mod assist;HOB elevated     General bed mobility comments: assist to lift trunk and scoot to EOB, able to get her legs off bed  Transfers Overall transfer level: Needs assistance Equipment used: Rolling walker (2 wheeled) Transfers: Sit to/from [employer] Sit to Stand: Mod assist;+2 safety/equipment Stand pivot transfers: Mod assist;+2 safety/equipment       General transfer comment: lifting assist from EOB and assist of 2 for safety with RW to step to recliner  Ambulation/Gait                Stairs            Wheelchair Mobility    Modified Rankin (Stroke Patients Only)       Balance Overall balance assessment: Needs assistance   Sitting balance-Leahy Scale: Fair     Standing balance support: Bilateral upper extremity supported Standing balance-Leahy Scale:  Poor Standing balance comment: UE support and external support for safety                             Pertinent Vitals/Pain Pain Assessment: No/denies pain    Home Living Family/patient expects to be discharged to:: Skilled nursing facility                 Additional Comments: From [facility]    Prior Function Level of Independence: Needs assistance   Gait / Transfers Assistance Needed: pt unable to state PLOF  ADL's / Homemaking Assistance Needed: had assist prior to recent fall with BADL's        Hand Dominance        Extremity/Trunk Assessment   Upper Extremity Assessment Upper Extremity Assessment: RUE deficits/detail RUE Deficits / Details: cast on R wrist to fingertips, able to move thumb and able to move elbow and shoulder    Lower Extremity Assessment Lower Extremity Assessment: LLE deficits/detail LLE Deficits / Details: slower to flex at hip, but does so unaided, weakness evident with steps to recliner       Communication   Communication: No difficulties  Cognition Arousal/Alertness: Awake/alert Behavior During Therapy: WFL for tasks assessed/performed Overall Cognitive Status: No family/caregiver present to determine baseline cognitive functioning  General Comments      Exercises     Assessment/Plan    PT Assessment Patent does not need any further PT services  PT Problem List         PT Treatment Interventions      PT Goals (Current goals can be found in the Care Plan section)  Acute Rehab PT Goals PT Goal Formulation: All assessment and education complete, DC therapy    Frequency     Barriers to discharge        Co-evaluation               AM-PAC PT "6 Clicks" Daily Activity  Outcome Measure Difficulty turning over in bed (including adjusting bedclothes, sheets and blankets)?: Unable Difficulty moving from lying on back to sitting on the side of the bed?  : Unable Difficulty sitting down on and standing up from a chair with arms (e.g., wheelchair, bedside commode, etc,.)?: Unable Help needed moving to and from a bed to chair (including a wheelchair)?: A Lot Help needed walking in hospital room?: Total Help needed climbing 3-5 steps with a railing? : Total 6 Click Score: 7    End of Session Equipment Utilized During Treatment: Gait belt Activity Tolerance: Patient tolerated treatment well Patient left: with call bell/phone within reach;in chair;with chair alarm set Nurse Communication: Mobility status PT Visit Diagnosis: Other abnormalities of gait and mobility (R26.89);History of falling (Z91.81)    Time: 1610-9604 PT Time Calculation (min) (ACUTE ONLY): 23 min   Charges:   PT Evaluation $PT Eval Moderate Complexity: 1 Mod PT Treatments $Therapeutic Activity: 8-22 mins   PT G CodesSheran Lawless, [place] [phone] 03/10/2018   Elray Mcgregor 03/10/2018, 2:04 PM

## 2018-03-10 NOTE — Progress Notes (Signed)
Donna Summers Skilled nursing facility per MD order.  Discussed with the patient and all questions fully answered.  VSS, Skin clean, dry and intact without evidence of skin break down, no evidence of skin tears noted. IV catheter discontinued intact. Site without signs and symptoms of complications. Dressing and pressure applied.  Report called to Judeth Cornfield, Charity fundraiser at [facility]. All questions answered.  Patient Summers with [facility]AR.  Donna Summers 03/10/2018 2:11 PM

## 2018-03-10 NOTE — Discharge Instructions (Signed)
Cast or Splint Care, Adult Casts and splints are supports that are worn to protect broken bones and other injuries. A cast or splint may hold a bone still and in the correct position while it heals. Casts and splints may also help to ease pain, swelling, and muscle spasms. How to care for your cast  Do not stick anything inside the cast to scratch your skin.  Check the skin around the cast every day. Tell your doctor about any concerns.  You may put lotion on dry skin around the edges of the cast. Do not put lotion on the skin under the cast.  Keep the cast clean.  If the cast is not waterproof: ? Do not let it get wet. ? Cover it with a watertight covering when you take a bath or a shower. How to care for your splint  Wear it as told by your doctor. Take it off only as told by your doctor.  Loosen the splint if your fingers or toes tingle, get numb, or turn cold and blue.  Keep the splint clean.  If the splint is not waterproof: ? Do not let it get wet. ? Cover it with a watertight covering when you take a bath or a shower. Follow these instructions at home: Bathing  Do not take baths or swim until your doctor says it is okay. Ask your doctor if you can take showers. You may only be allowed to take sponge baths for bathing.  If your cast or splint is not waterproof, cover it with a watertight covering when you take a bath or shower. Managing pain, stiffness, and swelling  Move your fingers or toes often to avoid stiffness and to lessen swelling.  Raise (elevate) the injured area above the level of your heart while sitting or lying down. Safety  Do not use the injured limb to support your body weight until your doctor says that it is okay.  Use crutches or other assistive devices as told by your doctor. General instructions  Do not put pressure on any part of the cast or splint until it is fully hardened. This may take many hours.  Return to your normal activities as  told by your doctor. Ask your doctor what activities are safe for you.  Keep all follow-up visits as told by your doctor. This is important. Contact a doctor if:  Your cast or splint gets damaged.  The skin around the cast gets red or raw.  The skin under the cast is very itchy or painful.  Your cast or splint feels very uncomfortable.  Your cast or splint is too tight or too loose.  Your cast becomes wet or it starts to have a soft spot or area.  You get an object stuck under your cast. Get help right away if:  Your pain gets worse.  The injured area tingles, gets numb, or turns blue and cold.  The part of your body above or below the cast is swollen and it turns a different color (is discolored).  You cannot feel or move your fingers or toes.  There is fluid leaking through the cast.  You have very bad pain or pressure under the cast.  You have trouble breathing.  You have shortness of breath.  You have chest pain. This information is not intended to replace advice given to you by your health care provider. Make sure you discuss any questions you have with your health care provider. Document Released: 02/04/2011 Document   Revised: 09/25/2016 Document Reviewed: 09/25/2016 Elsevier Interactive Patient Education  2017 Elsevier Inc.  

## 2018-03-10 NOTE — Care Management Note (Signed)
Case Management Note  Patient Details  Name: Donna Summers MRN: [id number] Date of Birth: May 15, 1920  Subjective/Objective:       Pt s/p fall, hx  of CKD, dementia, hypertension, hyperlipidemia was brought from skilled nursing facility.     Moshe Cipro [place] (Son) Kambre Messner [phone]         PCP: Willow Ora  Action/Plan: Transition to SNF with hospice  to follow. CSW managing disposition to facility.  Expected Discharge Date:   03/10/2018           Expected Discharge Plan:  Skilled Nursing Facility  In-House Referral:  Clinical Social Work  Discharge planning Services       Status of Service:  Completed, signed off  If discussed at [employer] of Stay Meetings, dates discussed:    Additional Comments:  Epifanio Lesches, RN 03/10/2018, 11:21 AM

## 2018-03-10 NOTE — NC FL2 (Signed)
[place] MEDICAID FL2 LEVEL OF CARE SCREENING TOOL     IDENTIFICATION  Patient Name: Donna Summers Birthdate: 09-24-1920 Sex: female Admission Date (Current Location): 03/07/2018  [facility] and [place] Number:  Producer, television/film/video and Address:  [facility], [address] [place], [place] [address]      Provider Number: [id number]  Attending Physician Name and Address:  Darlin Drop, DO  Relative Name and Phone Number:  Genelle Bal, son, [phone]    Current Level of Care: Hospital Recommended Level of Care: Skilled Nursing Facility Prior Approval Number:    Date Approved/Denied:   PASRR Number: [id number]  Discharge Plan: SNF    Current Diagnoses: Patient Active Problem List   Diagnosis Date Noted  . UTI (urinary tract infection) 03/08/2018  . Palliative care by specialist   . Goals of care, counseling/discussion   . Shortness of breath   . Generalized pain   . Glaucoma 03/07/2018  . Fall 03/07/2018  . History of left hip hemiarthroplasty 02/11/2018  . Pressure injury of skin 02/01/2018  . Nausea and vomiting in adult   . Closed left hip fracture, initial encounter (HCC) 01/28/2018  . CKD (chronic kidney disease), stage III (HCC) 01/28/2018  . Hypertensive urgency   . Dysphagia 01/20/2018  . Acute lower UTI 01/20/2018  . Dehydration 01/20/2018  . Weakness generalized 01/20/2018  . Symptomatic cholelithiasis 01/19/2018  . Cholelithiasis 01/19/2018  . Cholecystitis 01/06/2018  . Constipation 01/06/2018  . History of urinary retention 01/06/2018  . Abdominal pain 01/06/2018  . Vitamin D deficiency 01/06/2018  . HLD (hyperlipidemia) 01/06/2018  . GERD (gastroesophageal reflux disease) 01/06/2018  . HTN (hypertension) 01/06/2018    Orientation RESPIRATION BLADDER Height & Weight     Self, Situation, Place  O2(Nasal cannula 2L) Incontinent, External catheter Weight: 66.2 kg (146 lb) Height:   (160 cm)  BEHAVIORAL  SYMPTOMS/MOOD NEUROLOGICAL BOWEL NUTRITION STATUS  (Dementia)   Continent Diet(Please see DC Summary)  AMBULATORY STATUS COMMUNICATION OF NEEDS Skin   Extensive Assist   PU Stage and Appropriate Care(Stage II on sacrum)                       Personal Care Assistance Level of Assistance  Bathing, Feeding, Dressing Bathing Assistance: Maximum assistance Feeding assistance: Limited assistance Dressing Assistance: Limited assistance     Functional Limitations Info  Sight, Hearing, Speech Sight Info: Adequate Hearing Info: Adequate Speech Info: Adequate    SPECIAL CARE FACTORS FREQUENCY                       Contractures      Additional Factors Info  Code Status, Allergies Code Status Info: DNR Allergies Info: Bactrim Sulfamethoxazole-trimethoprim, Levaquin Levofloxacin           Current Medications (03/10/2018):  This is the current hospital active medication list Current Facility-Administered Medications  Medication Dose Route Frequency Provider Last Rate Last Dose  . acetaminophen (TYLENOL) solution 650 mg  650 mg Oral Q8H Alita Chyle, NP   650 mg at 03/09/18 2203  . [START ON 03/11/2018] amLODipine (NORVASC) tablet 10 mg  10 mg Oral Daily Hall, Carole N, DO      . brimonidine (ALPHAGAN) 0.2 % ophthalmic solution 1 drop  1 drop Left Eye TID Jonah Blue, MD   1 drop at 03/09/18 1553   Or  . dorzolamide (TRUSOPT) 2 % ophthalmic solution 1 drop  1 drop  Left Eye TID Jonah Blue, MD   1 drop at 03/10/18 [id number]  . enoxaparin (LOVENOX) injection 30 mg  30 mg Subcutaneous Q24H Ilda Basset, [place]   30 mg at 03/09/18 2204  . guaiFENesin-dextromethorphan (ROBITUSSIN DM) 100-10 MG/5ML syrup 5 mL  5 mL Oral Q4H PRN Marlowe Kays E, PA-C   5 mL at 03/10/18 0933  . HYDROcodone-acetaminophen (NORCO) 7.5-325 MG per tablet 1-2 tablet  1-2 tablet Oral Q6H PRN Marcos Eke, PA-C      . ipratropium-albuterol (DUONEB) 0.5-2.5 (3) MG/3ML nebulizer solution 3 mL  3 mL  Nebulization Q4H PRN Hall, Carole N, DO      . irbesartan (AVAPRO) tablet 37.5 mg  37.5 mg Oral QHS Marcos Eke, PA-C   37.5 mg at 03/09/18 2203  . latanoprost (XALATAN) 0.005 % ophthalmic solution 1 drop  1 drop Left Eye Noemi Chapel, MD   1 drop at 03/09/18 2204  . meclizine (ANTIVERT) tablet 12.5 mg  12.5 mg Oral TID PRN Marcos Eke, PA-C      . morphine 4 MG/ML injection 1 mg  1 mg Intravenous Q4H PRN Marcos Eke, PA-C      . ondansetron (ZOFRAN) tablet 4 mg  4 mg Oral Q6H PRN Marcos Eke, PA-C       Or  . ondansetron (ZOFRAN) injection 4 mg  4 mg Intravenous Q6H PRN Marcos Eke, PA-C      . pantoprazole (PROTONIX) EC tablet 40 mg  40 mg Oral BID Marcos Eke, PA-C   40 mg at 03/10/18 0933  . polyethylene glycol (MIRALAX / GLYCOLAX) packet 17 g  17 g Oral Daily Marcos Eke, PA-C   17 g at 03/10/18 9604     Discharge Medications: Please see discharge summary for a list of discharge medications.  Relevant Imaging Results:  Relevant Lab Results:   Additional Information SSN; [id number]     Add Hospice services  Mearl Latin, [place]

## 2018-03-10 NOTE — Progress Notes (Signed)
Patient will DC to: [facility] Re[facility] Anticipated DC date: 03/10/18 Family notified: Son Transport by: Sherlyn Hay   Per MD patient ready for DC to [place]. RN, patient, patient's family, and facility notified of DC. Discharge Summary sent to facility. RN given number for report [phone]). DC packet on chart. Ambulance transport requested for patient.   CSW signing off.  Cristobal Goldmann, LCSW Clinical Social Worker [phone]

## 2018-03-11 ENCOUNTER — Telehealth: Payer: Self-pay

## 2018-03-11 NOTE — Telephone Encounter (Signed)
03/11/18  Called patient/family to complete TCM. Son states patient was admitted to SNF where she will be residing from this point on. Will be followed by facility provider.

## 2018-03-12 LAB — CULTURE, BLOOD (ROUTINE X 2)
CULTURE: NO GROWTH
Culture: NO GROWTH
SPECIAL REQUESTS: ADEQUATE
Special Requests: ADEQUATE

## 2018-03-15 ENCOUNTER — Ambulatory Visit (INDEPENDENT_AMBULATORY_CARE_PROVIDER_SITE_OTHER): Payer: Medicare Other | Admitting: Orthopaedic Surgery

## 2018-04-10 ENCOUNTER — Inpatient Hospital Stay (HOSPITAL_COMMUNITY)
Admission: EM | Admit: 2018-04-10 | Discharge: 2018-04-13 | DRG: 481 | Disposition: A | Discharge status: Skilled Nursing Facility | Attending: Internal Medicine | Admitting: Internal Medicine

## 2018-04-10 ENCOUNTER — Emergency Department (HOSPITAL_COMMUNITY)

## 2018-04-10 ENCOUNTER — Encounter (HOSPITAL_COMMUNITY): Payer: Self-pay | Admitting: Emergency Medicine

## 2018-04-10 ENCOUNTER — Other Ambulatory Visit: Payer: Self-pay

## 2018-04-10 DIAGNOSIS — Z79899 Other long term (current) drug therapy: Secondary | ICD-10-CM | POA: Diagnosis not present

## 2018-04-10 DIAGNOSIS — D649 Anemia, unspecified: Secondary | ICD-10-CM

## 2018-04-10 DIAGNOSIS — Z96642 Presence of left artificial hip joint: Secondary | ICD-10-CM | POA: Diagnosis present

## 2018-04-10 DIAGNOSIS — Y92129 Unspecified place in nursing home as the place of occurrence of the external cause: Secondary | ICD-10-CM

## 2018-04-10 DIAGNOSIS — I10 Essential (primary) hypertension: Secondary | ICD-10-CM | POA: Diagnosis present

## 2018-04-10 DIAGNOSIS — R296 Repeated falls: Secondary | ICD-10-CM | POA: Diagnosis present

## 2018-04-10 DIAGNOSIS — Z9181 History of falling: Secondary | ICD-10-CM | POA: Diagnosis not present

## 2018-04-10 DIAGNOSIS — S72011A Unspecified intracapsular fracture of right femur, initial encounter for closed fracture: Secondary | ICD-10-CM | POA: Diagnosis present

## 2018-04-10 DIAGNOSIS — E46 Unspecified protein-calorie malnutrition: Secondary | ICD-10-CM | POA: Diagnosis present

## 2018-04-10 DIAGNOSIS — Z882 Allergy status to sulfonamides status: Secondary | ICD-10-CM

## 2018-04-10 DIAGNOSIS — S72001D Fracture of unspecified part of neck of right femur, subsequent encounter for closed fracture with routine healing: Secondary | ICD-10-CM

## 2018-04-10 DIAGNOSIS — W19XXXD Unspecified fall, subsequent encounter: Secondary | ICD-10-CM | POA: Diagnosis not present

## 2018-04-10 DIAGNOSIS — Z515 Encounter for palliative care: Secondary | ICD-10-CM | POA: Diagnosis present

## 2018-04-10 DIAGNOSIS — F0390 Unspecified dementia without behavioral disturbance: Secondary | ICD-10-CM | POA: Diagnosis present

## 2018-04-10 DIAGNOSIS — Z6826 Body mass index (BMI) 26.0-26.9, adult: Secondary | ICD-10-CM

## 2018-04-10 DIAGNOSIS — H409 Unspecified glaucoma: Secondary | ICD-10-CM | POA: Diagnosis present

## 2018-04-10 DIAGNOSIS — Z66 Do not resuscitate: Secondary | ICD-10-CM | POA: Diagnosis present

## 2018-04-10 DIAGNOSIS — N183 Chronic kidney disease, stage 3 unspecified: Secondary | ICD-10-CM | POA: Diagnosis present

## 2018-04-10 DIAGNOSIS — Z7901 Long term (current) use of anticoagulants: Secondary | ICD-10-CM | POA: Diagnosis not present

## 2018-04-10 DIAGNOSIS — K210 Gastro-esophageal reflux disease with esophagitis: Secondary | ICD-10-CM

## 2018-04-10 DIAGNOSIS — W1830XA Fall on same level, unspecified, initial encounter: Secondary | ICD-10-CM | POA: Diagnosis present

## 2018-04-10 DIAGNOSIS — I129 Hypertensive chronic kidney disease with stage 1 through stage 4 chronic kidney disease, or unspecified chronic kidney disease: Secondary | ICD-10-CM | POA: Diagnosis present

## 2018-04-10 DIAGNOSIS — Z8673 Personal history of transient ischemic attack (TIA), and cerebral infarction without residual deficits: Secondary | ICD-10-CM

## 2018-04-10 DIAGNOSIS — K219 Gastro-esophageal reflux disease without esophagitis: Secondary | ICD-10-CM | POA: Diagnosis present

## 2018-04-10 DIAGNOSIS — S7290XA Unspecified fracture of unspecified femur, initial encounter for closed fracture: Secondary | ICD-10-CM

## 2018-04-10 DIAGNOSIS — D62 Acute posthemorrhagic anemia: Secondary | ICD-10-CM | POA: Diagnosis not present

## 2018-04-10 DIAGNOSIS — E785 Hyperlipidemia, unspecified: Secondary | ICD-10-CM | POA: Diagnosis present

## 2018-04-10 DIAGNOSIS — S72001A Fracture of unspecified part of neck of right femur, initial encounter for closed fracture: Secondary | ICD-10-CM

## 2018-04-10 DIAGNOSIS — S72009A Fracture of unspecified part of neck of unspecified femur, initial encounter for closed fracture: Secondary | ICD-10-CM | POA: Diagnosis present

## 2018-04-10 DIAGNOSIS — W19XXXA Unspecified fall, initial encounter: Secondary | ICD-10-CM

## 2018-04-10 DIAGNOSIS — Z881 Allergy status to other antibiotic agents status: Secondary | ICD-10-CM | POA: Diagnosis not present

## 2018-04-10 HISTORY — DX: Anemia, unspecified: D64.9

## 2018-04-10 HISTORY — DX: Cerebral infarction, unspecified: I63.9

## 2018-04-10 LAB — TYPE AND SCREEN
ABO/RH(D): A POS
Antibody Screen: NEGATIVE

## 2018-04-10 LAB — CBC WITH DIFFERENTIAL/PLATELET
Abs Immature Granulocytes: 0.1 10*3/uL (ref 0.0–0.1)
BASOS ABS: 0 10*3/uL (ref 0.0–0.1)
Basophils Relative: 0 %
EOS PCT: 1 %
Eosinophils Absolute: 0.1 10*3/uL (ref 0.0–0.7)
HCT: 35.9 % — ABNORMAL LOW (ref 36.0–46.0)
Hemoglobin: 10.8 g/dL — ABNORMAL LOW (ref 12.0–15.0)
Immature Granulocytes: 0 %
Lymphocytes Relative: 20 %
Lymphs Abs: 2.7 10*3/uL (ref 0.7–4.0)
MCH: 24.8 pg — ABNORMAL LOW (ref 26.0–34.0)
MCHC: 30.1 g/dL (ref 30.0–36.0)
MCV: 82.5 fL (ref 78.0–100.0)
Monocytes Absolute: 1.1 10*3/uL — ABNORMAL HIGH (ref 0.1–1.0)
Monocytes Relative: 8 %
Neutro Abs: 9.3 10*3/uL — ABNORMAL HIGH (ref 1.7–7.7)
Neutrophils Relative %: 71 %
Platelets: 296 10*3/uL (ref 150–400)
RBC: 4.35 MIL/uL (ref 3.87–5.11)
RDW: 15.2 % (ref 11.5–15.5)
WBC: 13.2 10*3/uL — ABNORMAL HIGH (ref 4.0–10.5)

## 2018-04-10 LAB — URINALYSIS, ROUTINE W REFLEX MICROSCOPIC
Bilirubin Urine: NEGATIVE
Glucose, UA: NEGATIVE mg/dL
Hgb urine dipstick: NEGATIVE
KETONES UR: NEGATIVE mg/dL
NITRITE: NEGATIVE
PH: 6 (ref 5.0–8.0)
Protein, ur: NEGATIVE mg/dL
SPECIFIC GRAVITY, URINE: 1.017 (ref 1.005–1.030)

## 2018-04-10 LAB — BASIC METABOLIC PANEL
Anion gap: 9 (ref 5–15)
BUN: 22 mg/dL — AB (ref 6–20)
CALCIUM: 9.1 mg/dL (ref 8.9–10.3)
CO2: 23 mmol/L (ref 22–32)
CREATININE: 1.4 mg/dL — AB (ref 0.44–1.00)
Chloride: 108 mmol/L (ref 101–111)
GFR calc Af Amer: 36 mL/min — ABNORMAL LOW (ref >60)
GFR, EST NON AFRICAN AMERICAN: 31 mL/min — AB (ref >60)
Glucose, Bld: 114 mg/dL — ABNORMAL HIGH (ref 65–99)
Potassium: 3.9 mmol/L (ref 3.5–5.1)
SODIUM: 140 mmol/L (ref 135–145)

## 2018-04-10 LAB — ABO/RH: ABO/RH(D): A POS

## 2018-04-10 LAB — MRSA PCR SCREENING: MRSA BY PCR: NEGATIVE

## 2018-04-10 LAB — PROTIME-INR
INR: 1.14
Prothrombin Time: 14.5 seconds (ref 11.4–15.2)

## 2018-04-10 LAB — TSH: TSH: 2.24 u[IU]/mL (ref 0.350–4.500)

## 2018-04-10 MED ORDER — POLYVINYL ALCOHOL 1.4 % OP SOLN
1.0000 [drp] | Freq: Two times a day (BID) | OPHTHALMIC | Status: DC | PRN
  Filled 2018-04-10: qty 15

## 2018-04-10 MED ORDER — SODIUM CHLORIDE 0.9 % IV SOLN
250.0000 mL | INTRAVENOUS | Status: DC | PRN
  Administered 2018-04-12: 20 mL via INTRAVENOUS

## 2018-04-10 MED ORDER — POLYETHYLENE GLYCOL 3350 17 G PO PACK
17.0000 g | PACK | Freq: Every day | ORAL | Status: DC
Start: 2018-04-10 — End: 2018-04-13
  Administered 2018-04-12 – 2018-04-13 (×2): 17 g via ORAL
  Filled 2018-04-10 (×2): qty 1

## 2018-04-10 MED ORDER — ONDANSETRON HCL 4 MG/2ML IJ SOLN: 4.0000 mg | Freq: Four times a day (QID) | INTRAMUSCULAR | Status: DC | PRN

## 2018-04-10 MED ORDER — IRBESARTAN 75 MG PO TABS
37.5000 mg | ORAL_TABLET | Freq: Every day | ORAL | Status: DC
  Administered 2018-04-10: 37.5 mg via ORAL
  Filled 2018-04-10 (×2): qty 0.5

## 2018-04-10 MED ORDER — FENTANYL CITRATE (PF) 100 MCG/2ML IJ SOLN
50.0000 ug | INTRAMUSCULAR | Status: DC | PRN
  Administered 2018-04-10: 50 ug via INTRAVENOUS
  Filled 2018-04-10: qty 2

## 2018-04-10 MED ORDER — PRO-STAT SUGAR FREE PO LIQD
30.0000 mL | Freq: Every day | ORAL | Status: DC
  Administered 2018-04-11 – 2018-04-13 (×3): 30 mL via ORAL
  Filled 2018-04-10 (×2): qty 30

## 2018-04-10 MED ORDER — ACETAMINOPHEN 650 MG RE SUPP: 650.0000 mg | Freq: Four times a day (QID) | RECTAL | Status: DC | PRN

## 2018-04-10 MED ORDER — MECLIZINE HCL 12.5 MG PO TABS
12.5000 mg | ORAL_TABLET | Freq: Three times a day (TID) | ORAL | Status: DC | PRN
  Filled 2018-04-10: qty 1

## 2018-04-10 MED ORDER — AMLODIPINE BESYLATE 10 MG PO TABS
10.0000 mg | ORAL_TABLET | Freq: Every day | ORAL | Status: DC
  Administered 2018-04-11 – 2018-04-12 (×2): 10 mg via ORAL
  Filled 2018-04-10 (×2): qty 1

## 2018-04-10 MED ORDER — SODIUM CHLORIDE 0.9% FLUSH
3.0000 mL | Freq: Two times a day (BID) | INTRAVENOUS | Status: DC
  Administered 2018-04-10 – 2018-04-13 (×5): 3 mL via INTRAVENOUS

## 2018-04-10 MED ORDER — ENOXAPARIN SODIUM 30 MG/0.3ML ~~LOC~~ SOLN
30.0000 mg | SUBCUTANEOUS | Status: DC
  Administered 2018-04-10: 30 mg via SUBCUTANEOUS
  Filled 2018-04-10: qty 0.3

## 2018-04-10 MED ORDER — DORZOLAMIDE HCL 2 % OP SOLN
1.0000 [drp] | Freq: Three times a day (TID) | OPHTHALMIC | Status: DC
  Administered 2018-04-10 – 2018-04-13 (×8): 1 [drp] via OPHTHALMIC
  Filled 2018-04-10: qty 10

## 2018-04-10 MED ORDER — SODIUM CHLORIDE 0.9% FLUSH: 3.0000 mL | INTRAVENOUS | Status: DC | PRN

## 2018-04-10 MED ORDER — ASPIRIN EC 81 MG PO TBEC
81.0000 mg | DELAYED_RELEASE_TABLET | Freq: Every day | ORAL | Status: DC
  Administered 2018-04-12 – 2018-04-13 (×2): 81 mg via ORAL
  Filled 2018-04-10 (×2): qty 1

## 2018-04-10 MED ORDER — IPRATROPIUM-ALBUTEROL 0.5-2.5 (3) MG/3ML IN SOLN: 3.0000 mL | RESPIRATORY_TRACT | Status: DC | PRN

## 2018-04-10 MED ORDER — ONDANSETRON HCL 4 MG/2ML IJ SOLN
4.0000 mg | Freq: Once | INTRAMUSCULAR | Status: AC
  Administered 2018-04-10: 4 mg via INTRAVENOUS
  Filled 2018-04-10: qty 2

## 2018-04-10 MED ORDER — SODIUM CHLORIDE 0.9 % IV SOLN
INTRAVENOUS | Status: AC
  Administered 2018-04-11: 04:00:00 via INTRAVENOUS

## 2018-04-10 MED ORDER — PANTOPRAZOLE SODIUM 40 MG PO TBEC
40.0000 mg | DELAYED_RELEASE_TABLET | Freq: Two times a day (BID) | ORAL | Status: DC
  Administered 2018-04-10 – 2018-04-13 (×5): 40 mg via ORAL
  Filled 2018-04-10 (×5): qty 1

## 2018-04-10 MED ORDER — BRIMONIDINE TARTRATE 0.15 % OP SOLN
1.0000 [drp] | Freq: Three times a day (TID) | OPHTHALMIC | Status: DC
  Administered 2018-04-10 – 2018-04-13 (×8): 1 [drp] via OPHTHALMIC
  Filled 2018-04-10: qty 5

## 2018-04-10 MED ORDER — LATANOPROST 0.005 % OP SOLN
1.0000 [drp] | Freq: Every day | OPHTHALMIC | Status: DC
  Administered 2018-04-10 – 2018-04-11 (×2): 1 [drp] via OPHTHALMIC
  Filled 2018-04-10 (×2): qty 2.5

## 2018-04-10 MED ORDER — TRAVOPROST 0.004 % OP SOLN: 1.0000 [drp] | Freq: Every day | OPHTHALMIC | Status: DC

## 2018-04-10 MED ORDER — HYDROCODONE-ACETAMINOPHEN 5-325 MG PO TABS
1.0000 | ORAL_TABLET | Freq: Four times a day (QID) | ORAL | Status: DC | PRN
Start: 2018-04-10 — End: 2018-04-11
  Administered 2018-04-10 – 2018-04-11 (×4): 1 via ORAL
  Filled 2018-04-10 (×4): qty 1

## 2018-04-10 MED ORDER — DOCUSATE SODIUM 100 MG PO CAPS
100.0000 mg | ORAL_CAPSULE | Freq: Every day | ORAL | Status: DC
  Administered 2018-04-10: 100 mg via ORAL
  Filled 2018-04-10: qty 1

## 2018-04-10 MED ORDER — ACETAMINOPHEN 325 MG PO TABS: 650.0000 mg | ORAL_TABLET | Freq: Four times a day (QID) | ORAL | Status: DC | PRN

## 2018-04-10 MED ORDER — GENTEAL TEARS 0.1-0.2-0.3 % OP SOLN: 1.0000 [drp] | Freq: Two times a day (BID) | OPHTHALMIC | Status: DC | PRN

## 2018-04-10 NOTE — H&P (Signed)
TRH H&P   Patient Demographics:    Donna Summers, is a 84 y.o. female  MRN: [id number]   DOB - [birth year]  Admit Date - 04/10/2018  Outpatient Primary MD for the patient is [name], MD  Referring MD/NP/PA: Arthor Captain  Outpatient Specialists:    Patient coming from: [facility]  Chief Complaint  Patient presents with  . Fall  . Hip Pain      HPI:    Donna Summers  is a 84 y.o. female, w hypertension, ckd stage3,  Genella Rife, prior L hip fracture apparently had mechanical fall while slipping on linoleum  At about 5:30 this am.  Xray showed femoral neck fracture right and so transported to ED.   In Ed,  Xray right femur  IMPRESSION: Nondisplaced subcapital fracture of the right femoral neck.  Xray pelvis IMPRESSION: Subcapital fracture of the right femoral neck.  CXR IMPRESSION: 1. Probable small left pleural effusion. 2. Chronic interstitial prominence and mild diffuse peribronchial cuffing appear slightly worse than prior examinations. This may suggest an associated acute bronchitis. 3. Aortic atherosclerosis. 4. Multiple old posterolateral right-sided rib fractures with posttraumatic deformity.   type and screen performed.  Na 140, K 3.9, Bun22, Creatinine 1.40 Wbc 13.2, Hgb 10.8 Plt 296 INR 1.14   ED consulted orthopedics , no OR today, possibly tomorrow.  Pt will be admitted for mechanical fall resulting in nondisplaced subcapital fracture right femoral neck.      Review of systems:    In addition to the HPI above,  No Fever-chills, No Headache, No changes with Vision or hearing, No problems swallowing food or Liquids, No Chest pain, Cough or Shortness of Breath, No Abdominal pain, No Nausea or Vommitting, Bowel movements are regular, No Blood in stool or Urine, No dysuria, No new skin rashes or bruises,  No new weakness,  tingling, numbness in any extremity, No recent weight gain or loss, No polyuria, polydypsia or polyphagia, No significant Mental Stressors.  A full 10 point Review of Systems was done, except as stated above, all other Review of Systems were negative.   With Past History of the following :    Past Medical History:  Diagnosis Date  . Anemia 04/10/2018  . Constipation   . GERD (gastroesophageal reflux disease)   . Glaucoma   . History of carotid artery disease   . Hyperlipidemia   . Hypertension   . Stroke [facility]) 03/07/2018   old lacunar infarcts on CT scan brain 03/07/2018  . UTI (urinary tract infection)   . Vertigo       Past Surgical History:  Procedure Laterality Date  . ANTERIOR APPROACH HEMI HIP ARTHROPLASTY Left 01/28/2018   Procedure: DIRECT ANTERIOR APPROACH HEMI HIP ARTHROPLASTY;  Surgeon: Tarry Kos, MD;  Location: [facility] OR;  Service: Orthopedics;  Laterality: Left;  . ESOPHAGOGASTRODUODENOSCOPY (EGD) WITH PROPOFOL N/A 01/22/2018  Procedure: ESOPHAGOGASTRODUODENOSCOPY (EGD) WITH PROPOFOL;  Surgeon: Graylin Shiver, MD;  Location: [facility] ENDOSCOPY;  Service: Endoscopy;  Laterality: N/A;  . UNKNOWN SURGICAL HISTORY        Social History:     Social History   Tobacco Use  . Smoking status: Never Smoker  . Smokeless tobacco: Never Used  Substance Use Topics  . Alcohol use: Not Currently    Frequency: Never     Lives -  SNF [facility]  Mobility - typically walks by self   Family History :    History reviewed. No pertinent family history. No family hx of osteoporosis   Home Medications:   Prior to Admission medications   Medication Sig Start Date End Date Taking? Authorizing Provider  Amino Acids-Protein Hydrolys (FEEDING SUPPLEMENT, PRO-STAT SUGAR FREE 64,) LIQD Take 30 mLs by mouth daily.    [provider]  amLODipine (NORVASC) 10 MG tablet Take 1 tablet (10 mg total) by mouth daily. 03/11/18   Darlin Drop, DO  Artificial Tear Solution (GENTEAL  TEARS) 0.1-0.2-0.3 % SOLN Place 1-2 drops into the left eye 2 (two) times daily as needed (itching).    [provider]  Dextromethorphan-guaiFENesin (ROBITUSSIN COUGH+CHEST CONG DM) 5-100 MG/5ML LIQD Take 15 mLs by mouth 3 (three) times daily.    [provider]  docusate sodium (COLACE) 100 MG capsule Take 100 mg by mouth at bedtime.    [provider]  enoxaparin (LOVENOX) 40 MG/0.4ML injection Inject 0.4 mLs (40 mg total) into the skin daily. 01/28/18   Tarry Kos, MD  ipratropium-albuterol (DUONEB) 0.5-2.5 (3) MG/3ML SOLN Take 3 mLs by nebulization 2 (two) times daily.    [provider]  irbesartan (AVAPRO) 75 MG tablet Take 37.5 mg by mouth at bedtime. TAKES 1/2 TABLET    [provider]  meclizine (ANTIVERT) 12.5 MG tablet Take 12.5 mg by mouth 3 (three) times daily as needed for dizziness.    [provider]  pantoprazole (PROTONIX) 40 MG tablet Take 1 tablet (40 mg total) by mouth 2 (two) times daily. 01/24/18   Purohit, Salli Quarry, MD  polyethylene glycol (MIRALAX / GLYCOLAX) packet Take 17 g by mouth daily. 01/08/18   Lenox Ponds, MD  travoprost, benzalkonium, (TRAVATAN) 0.004 % ophthalmic solution Place 1 drop into the left eye at bedtime.    [provider]     Allergies:     Allergies  Allergen Reactions  . Bactrim [Sulfamethoxazole-Trimethoprim] Other (See Comments)    unknown  . Levaquin [Levofloxacin] Other (See Comments)    unknown     Physical Exam:   Vitals  Blood pressure 130/60, pulse 73, temperature 98.1 F (36.7 C), temperature source Oral, resp. rate 14, height 5\' 2"  (1.575 m), weight 65.8 kg (145 lb), SpO2 97 %.   1. General  lying in bed in NAD,    2. Normal affect and insight, Not Suicidal or Homicidal, Awake Alert, Oriented X 3.  3. No F.N deficits, ALL C.Nerves Intact, Strength 5/5 all 4 extremities, Sensation intact all 4 extremities, Plantars down going.  4. Ears and Eyes appear  Normal, Conjunctivae clear, PERRLA. Moist Oral Mucosa.  5. Supple Neck, No JVD, No cervical lymphadenopathy appriciated, No Carotid Bruits.  6. Symmetrical Chest wall movement, Good air movement bilaterally, CTAB. Slight decrease in bs left lung base  7. RRR, No Gallops, Rubs or Murmurs, No Parasternal Heave.  8. Positive Bowel Sounds, Abdomen Soft, No tenderness, No organomegaly appriciated,No rebound -guarding or  rigidity.  9.  No Cyanosis, Normal Skin Turgor, No Skin Rash or Bruise.  10. Good muscle tone,  joints appear normal , no effusions, Normal ROM.  11. No Palpable Lymph Nodes in Neck or Axillae     Data Review:    CBC Recent Labs  Lab 04/10/18 0957  WBC 13.2*  HGB 10.8*  HCT 35.9*  PLT 296  MCV 82.5  MCH 24.8*  MCHC 30.1  RDW 15.2  LYMPHSABS 2.7  MONOABS 1.1*  EOSABS 0.1  BASOSABS 0.0   ------------------------------------------------------------------------------------------------------------------  Chemistries  Recent Labs  Lab 04/10/18 0957  NA 140  K 3.9  CL 108  CO2 23  GLUCOSE 114*  BUN 22*  CREATININE 1.40*  CALCIUM 9.1   ------------------------------------------------------------------------------------------------------------------ estimated creatinine clearance is 20.9 mL/min (A) (by C-G formula based on SCr of 1.4 mg/dL (H)). ------------------------------------------------------------------------------------------------------------------ No results for input(s): TSH, T4TOTAL, T3FREE, THYROIDAB in the last 72 hours.  Invalid input(s): FREET3  Coagulation profile Recent Labs  Lab 04/10/18 0957  INR 1.14   ------------------------------------------------------------------------------------------------------------------- No results for input(s): DDIMER in the last 72 hours. -------------------------------------------------------------------------------------------------------------------  Cardiac Enzymes No results for input(s):  CKMB, TROPONINI, MYOGLOBIN in the last 168 hours.  Invalid input(s): CK ------------------------------------------------------------------------------------------------------------------ No results found for: BNP   ---------------------------------------------------------------------------------------------------------------  Urinalysis    Component Value Date/Time   COLORURINE YELLOW 03/07/2018 1236   APPEARANCEUR HAZY (A) 03/07/2018 1236   LABSPEC 1.010 03/07/2018 1236   PHURINE 6.0 03/07/2018 1236   GLUCOSEU NEGATIVE 03/07/2018 1236   HGBUR SMALL (A) 03/07/2018 1236   BILIRUBINUR NEGATIVE 03/07/2018 1236   KETONESUR NEGATIVE 03/07/2018 1236   PROTEINUR NEGATIVE 03/07/2018 1236   NITRITE NEGATIVE 03/07/2018 1236   LEUKOCYTESUR LARGE (A) 03/07/2018 1236    ----------------------------------------------------------------------------------------------------------------   Imaging Results:    Dg Chest 1 View  Result Date: 04/10/2018 CLINICAL DATA:  84 year old female with history of unwitnessed fall at 5:40 a.m. this morning. Femoral neck fracture. EXAM: CHEST  1 VIEW COMPARISON:  Chest x-ray 03/07/2018. FINDINGS: Diffuse interstitial prominence and peribronchial cuffing, similar to prior examinations. No confluent consolidative airspace disease. Blunting of left costophrenic sulcus, which may suggest a small left pleural effusion. No right pleural effusion. No pneumothorax. Heart size is upper limits of normal. Upper mediastinal contours are within normal limits. Aortic atherosclerosis. Multiple old healed right-sided posterolateral rib fractures with mild posttraumatic deformity. IMPRESSION: 1. Probable small left pleural effusion. 2. Chronic interstitial prominence and mild diffuse peribronchial cuffing appear slightly worse than prior examinations. This may suggest an associated acute bronchitis. 3. Aortic atherosclerosis. 4. Multiple old posterolateral right-sided rib fractures with  posttraumatic deformity. Electronically Signed   By: Trudie Reed M.D.   On: 04/10/2018 11:23   Dg Pelvis 1-2 Views  Result Date: 04/10/2018 CLINICAL DATA:  Pt arrives via EMS from [facility] with reports of a witnessed fall at 5:40 AM. Pt endorses right hip pain, facility reports Xray showed fx of R femoral neck with impaction without displacement. EXAM: PELVIS - 1-2 VIEW COMPARISON:  02/11/2018. FINDINGS: There is a nondisplaced fracture of the subcapital right femoral neck. There is no significant angulation. No other fracture. Left hip prosthesis appears well seated and aligned. Right hip joint, SI joints and symphysis pubis are normally aligned. Bones are diffusely demineralized. IMPRESSION: Subcapital fracture of the right femoral neck. Electronically Signed   By: Amie Portland M.D.   On: 04/10/2018 11:29   Dg Femur, Min 2 Views Right  Result Date: 04/10/2018 CLINICAL DATA:  Pt  ar[facility] via EMS [facility] with reports of a witnessed fall at 5:40 AM. Pt endorses right hip pain, facility reports Xray showed fx of R femoral neck with impaction without displacement. EXAM: RIGHT FEMUR 2 VIEWS COMPARISON:  Current radiograph of the pelvis. FINDINGS: Nondisplaced subcapital fracture of the right femoral neck. No other fractures.  Hip and knee joints are normally aligned. Soft tissues are unremarkable. IMPRESSION: Nondisplaced subcapital fracture of the right femoral neck. Electronically Signed   By: Amie Portlandavid  Ormond M.D.   On: 04/10/2018 11:28    nsr at 80, nl axis, no st- t changes c/w ischemia   Assessment & Plan:    Principal Problem:   Femoral neck fracture (HCC) Active Problems:   GERD (gastroesophageal reflux disease)   HTN (hypertension)   CKD (chronic kidney disease), stage III (HCC)   Anemia   R femoral neck fracture  Dilaudid 0.5mg  iv q4h prn pain Zofran 4mg  iv q6h prn nausea Benadryl 25mg  iv q6h prn itching NPO after [place] Orthopedics consulted by ED,  appreciate input  Presumed osteoporosis Check vitamin D 25-oh and TSH Consider prolia or a bisphosphonate as outpatient, defer to PCP  Hypertension Cont irbesartan 37.5mg  po qday Cont Amlodipine 10mg  po qday  CKD stage 3 Hydrate with ns iv at 50mL per hour x 8 hours overnite while NPO Check cmp in am   CVA (old lacunar infarcts on CT ) Cont Aspirin 81mg  po qday Check lipid  Protein calorie malnutrition Cont Prostat 30mL po bid  Anemia Check cbc in am  Glaucoma Cont Alphagan Cont Travatan Cont Trusopt  Gerd Cont protonix 40mg  po qday     DVT Prophylaxis Lovenox   AM Labs Ordered, also please review Full Orders  Family Communication: Admission, patients condition and plan of care including tests being ordered have been discussed with the patient  who indicate understanding and agree with the plan and Code Status.  Code Status  DNR  Likely DC to SNF  Condition GUARDED    Consults called: orthopedics by ED (Dr. Magnus IvanBlackman)  Admission status: inpatient    Time spent in minutes : 70   Pearson GrippeJames Lamekia Nolden M.D on 04/10/2018 at 11:42 AM  Between 7am to 7pm - Pager - [phone]   After 7pm go to [email or web address] - password [email or web address]  [facility] - Office  [phone]

## 2018-04-10 NOTE — Progress Notes (Signed)
1300 Received pt from ED, A&O x3, forgetful. Pleasant and cooperative. Pt c/o pain to right hip, medicated.

## 2018-04-10 NOTE — ED Notes (Signed)
Patient transported to X-ray 

## 2018-04-10 NOTE — Consult Note (Signed)
Reason for Consult:  Right hip fracture Referring Physician:  Margarita Mail, PA-C  Donna Summers is an 84 y.o. female.  HPI: The patient is a 84 year old female who stays in a nursing care facility secondary to multiple medical issues and mainly dementia.  She has had a history of multiple falls.  She actually had sustained a hip fracture in May of this year and underwent a left hip hemiarthroplasty by my partner Dr. Erlinda Hong.  Since that time she has had several more falls.  She fell within the last 24 hours again and had pain with her right hip and inability to ambulate.  She was brought via EMS to the [facility] emergency room.  X-rays were obtained and shows a nondisplaced femoral neck fracture on the right side.  Her son is at the bedside and another son is driving from [place].  We have had a long and thorough discussion about her predicament.  Hospice from the facility is been involved in well and is continue to be involved.  The family is good to make decision about what the appropriate treatment at this standpoint can be with understanding of the best thing for her is mainly keeping her wheelchair-bound due to her being such a significant fall risk.  In the emergency room she appears stable but does exhibit pain with her right hip and states that is what is hurting.  Past Medical History:  Diagnosis Date  . Anemia 04/10/2018  . Constipation   . GERD (gastroesophageal reflux disease)   . Glaucoma   . History of carotid artery disease   . Hyperlipidemia   . Hypertension   . Stroke [facility]) 03/07/2018   old lacunar infarcts on CT scan brain 03/07/2018  . UTI (urinary tract infection)   . Vertigo     Past Surgical History:  Procedure Laterality Date  . ANTERIOR APPROACH HEMI HIP ARTHROPLASTY Left 01/28/2018   Procedure: DIRECT ANTERIOR APPROACH HEMI HIP ARTHROPLASTY;  Surgeon: Leandrew Koyanagi, MD;  Location: [facility];  Service: Orthopedics;  Laterality: Left;  . ESOPHAGOGASTRODUODENOSCOPY (EGD)  WITH PROPOFOL N/A 01/22/2018   Procedure: ESOPHAGOGASTRODUODENOSCOPY (EGD) WITH PROPOFOL;  Surgeon: Wonda Horner, MD;  Location: [facility];  Service: Endoscopy;  Laterality: N/A;  . UNKNOWN SURGICAL HISTORY      History reviewed. No pertinent family history.  Social History:  reports that she has never smoked. She has never used smokeless tobacco. She reports that she drank alcohol. She reports that she does not use drugs.  Allergies:  Allergies  Allergen Reactions  . Bactrim [Sulfamethoxazole-Trimethoprim] Other (See Comments)    unknown  . Levaquin [Levofloxacin] Other (See Comments)    unknown    Medications: I have reviewed the patient's current medications.  Results for orders placed or performed during the hospital encounter of 04/10/18 (from the past 48 hour(s))  Basic metabolic panel     Status: Abnormal   Collection Time: 04/10/18  9:57 AM  Result Value Ref Range   Sodium 140 135 - 145 mmol/L   Potassium 3.9 3.5 - 5.1 mmol/L   Chloride 108 101 - 111 mmol/L   CO2 23 22 - 32 mmol/L   Glucose, Bld 114 (H) 65 - 99 mg/dL   BUN 22 (H) 6 - 20 mg/dL   Creatinine, Ser 1.40 (H) 0.44 - 1.00 mg/dL   Calcium 9.1 8.9 - 10.3 mg/dL   GFR calc non Af Amer 31 (L) >60 mL/min   GFR calc Af Amer 36 (L) >60  mL/min    Comment: (NOTE) The eGFR has been calculated using the CKD EPI equation. This calculation has not been validated in all clinical situations. eGFR's persistently <60 mL/min signify possible Chronic Kidney Disease.    Anion gap 9 5 - 15    Comment: Performed at [facility] [address], [place], [place] [address]  CBC WITH DIFFERENTIAL     Status: Abnormal   Collection Time: 04/10/18  9:57 AM  Result Value Ref Range   WBC 13.2 (H) 4.0 - 10.5 K/uL   RBC 4.35 3.87 - 5.11 MIL/uL   Hemoglobin 10.8 (L) 12.0 - 15.0 g/dL   HCT 35.9 (L) 36.0 - 46.0 %   MCV 82.5 78.0 - 100.0 fL   MCH 24.8 (L) 26.0 - 34.0 pg   MCHC 30.1 30.0 - 36.0 g/dL   RDW 15.2 11.5 - 15.5 %    Platelets 296 150 - 400 K/uL   Neutrophils Relative % 71 %   Neutro Abs 9.3 (H) 1.7 - 7.7 K/uL   Lymphocytes Relative 20 %   Lymphs Abs 2.7 0.7 - 4.0 K/uL   Monocytes Relative 8 %   Monocytes Absolute 1.1 (H) 0.1 - 1.0 K/uL   Eosinophils Relative 1 %   Eosinophils Absolute 0.1 0.0 - 0.7 K/uL   Basophils Relative 0 %   Basophils Absolute 0.0 0.0 - 0.1 K/uL   Immature Granulocytes 0 %   Abs Immature Granulocytes 0.1 0.0 - 0.1 K/uL    Comment: Performed at [facility], [address], [place], [place] [address]  Protime-INR     Status: None   Collection Time: 04/10/18  9:57 AM  Result Value Ref Range   Prothrombin Time 14.5 11.4 - 15.2 seconds   INR 1.14     Comment: Performed at [facility] [address] [place], [place] [address]  ABO/Rh     Status: None   Collection Time: 04/10/18 10:02 AM  Result Value Ref Range   ABO/RH(D)      A POS Performed at [facility] [address] [place], [place] [address]   Type and screen [place]     Status: None   Collection Time: 04/10/18 10:10 AM  Result Value Ref Range   ABO/RH(D) A POS    Antibody Screen NEG    Sample Expiration      04/13/2018 Performed at [facility], [place] [address] [place], [place] [address]     Dg Chest 1 View  Result Date: 04/10/2018 CLINICAL DATA:  84 year old female with history of unwitnessed fall at 5:40 a.m. this morning. Femoral neck fracture. EXAM: CHEST  1 VIEW COMPARISON:  Chest x-ray 03/07/2018. FINDINGS: Diffuse interstitial prominence and peribronchial cuffing, similar to prior examinations. No confluent consolidative airspace disease. Blunting of left costophrenic sulcus, which may suggest a small left pleural effusion. No right pleural effusion. No pneumothorax. Heart size is upper limits of normal. Upper mediastinal contours are within normal limits. Aortic atherosclerosis. Multiple old healed right-sided posterolateral rib fractures with mild  posttraumatic deformity. IMPRESSION: 1. Probable small left pleural effusion. 2. Chronic interstitial prominence and mild diffuse peribronchial cuffing appear slightly worse than prior examinations. This may suggest an associated acute bronchitis. 3. Aortic atherosclerosis. 4. Multiple old posterolateral right-sided rib fractures with posttraumatic deformity. Electronically Signed   By: Vinnie Langton M.D.   On: 04/10/2018 11:23   Dg Pelvis 1-2 Views  Result Date: 04/10/2018 CLINICAL DATA:  Pt arrives via EMS from [facility] with reports of a witnessed fall at 5:40 AM. Pt endorses right hip pain, facility reports Xray showed fx of R femoral neck with impaction without displacement. EXAM: PELVIS - 1-2 VIEW COMPARISON:  02/11/2018. FINDINGS: There is a nondisplaced fracture of the subcapital right femoral neck. There is no significant angulation. No other fracture. Left hip prosthesis appears well seated and aligned. Right hip joint, SI joints and symphysis pubis are normally aligned. Bones are diffusely demineralized. IMPRESSION: Subcapital fracture of the right femoral neck. Electronically Signed   By: Lajean Manes M.D.   On: 04/10/2018 11:29   Dg Femur, Min 2 Views Right  Result Date: 04/10/2018 CLINICAL DATA:  Pt arrives via EMS from [facility] with reports of a witnessed fall at 5:40 AM. Pt endorses right hip pain, facility reports Xray showed fx of R femoral neck with impaction without displacement. EXAM: RIGHT FEMUR 2 VIEWS COMPARISON:  Current radiograph of the pelvis. FINDINGS: Nondisplaced subcapital fracture of the right femoral neck. No other fractures.  Hip and knee joints are normally aligned. Soft tissues are unremarkable. IMPRESSION: Nondisplaced subcapital fracture of the right femoral neck. Electronically Signed   By: Lajean Manes M.D.   On: 04/10/2018 11:28    ROS Blood pressure 130/60, pulse 73, temperature 98.1 F (36.7 C), temperature source Oral, resp.  rate 14, height 5' 2"  (1.575 m), weight 145 lb (65.8 kg), SpO2 97 %. Physical Exam  Constitutional: She appears well-developed and well-nourished.  HENT:  Head: Normocephalic and atraumatic.  Neck: Normal range of motion. Neck supple.  Cardiovascular: Normal rate.  Respiratory: Effort normal.  GI: Soft.  Musculoskeletal:       Right hip: She exhibits decreased range of motion, decreased strength, tenderness and bony tenderness.  Neurological: She is alert.    Assessment/Plan: Right hip nondisplaced femoral neck fracture  I agree with the family's goal of her having as much quality of life with comfort as possible.  I also agree with her not being in situation at all where she should be allowed to ambulate and mainly should just pivot into a wheelchair.  Her dementia is been to be significant limiting factor.  Given the nondisplaced nature of this fracture I would certainly consider an option of just cannulated screw fixation that would help from a pain control standpoint and certainly the quality of her bone and the nature of the fracture appears that it could accommodate a more minimally invasive surgery such as cannulated screw fixation.  This does not need to be done today and the family does want some time to talk amongst themselves and with hospice about options.  We can tentatively plan for surgery in the next 24 to 48 hours if so desired.  I will also let my partner Dr. Erlinda Hong noted that the patient is here as well.  Mcarthur Rossetti 04/10/2018, 12:10 PM

## 2018-04-10 NOTE — ED Triage Notes (Signed)
Pt arrives via EMS from [facility] with reports of a witnessed fall at 5:40 AM. Pt endorses right hip pain, facility reports Xray showed fx of R femoral neck with impaction without displacement. EMS gave 50  Mcg fentanyl.

## 2018-04-10 NOTE — ED Provider Notes (Signed)
[facility] EMERGENCY DEPARTMENT Provider Note   CSN: [id number] Arrival date & time: 04/10/18  4098     History   Chief Complaint Chief Complaint  Patient presents with  . Fall  . Hip Pain    HPI CARMELLE BAMBERG is a 84 y.o. female with a past medical history of reflux esophagitis, hypertension, chronic kidney disease and is status post left hip hemiarthroplasty after fall back in April 2019.  Patient presents from [facility] nursing facility after witnessed mechanical fall this morning.  This occurred around 5:30 AM.  The staff ordered a stat portable x-ray which showed a confirmed femoral neck fracture which is impacted on the right side.  Patient denies any numbness or tingling.  She received 100 mics of fentanyl prior to arrival and has a pain level now a 2 out of 10.  She denies any numbness or tingling.  HPI  Past Medical History:  Diagnosis Date  . Constipation   . GERD (gastroesophageal reflux disease)   . Glaucoma   . History of carotid artery disease   . Hyperlipidemia   . Hypertension   . UTI (urinary tract infection)   . Vertigo     Patient Active Problem List   Diagnosis Date Noted  . UTI (urinary tract infection) 03/08/2018  . Palliative care by specialist   . Goals of care, counseling/discussion   . Shortness of breath   . Generalized pain   . Glaucoma 03/07/2018  . Fall 03/07/2018  . History of left hip hemiarthroplasty 02/11/2018  . Pressure injury of skin 02/01/2018  . Nausea and vomiting in adult   . Closed left hip fracture, initial encounter (HCC) 01/28/2018  . CKD (chronic kidney disease), stage III (HCC) 01/28/2018  . Hypertensive urgency   . Dysphagia 01/20/2018  . Acute lower UTI 01/20/2018  . Dehydration 01/20/2018  . Weakness generalized 01/20/2018  . Symptomatic cholelithiasis 01/19/2018  . Cholelithiasis 01/19/2018  . Cholecystitis 01/06/2018  . Constipation 01/06/2018  . History of urinary retention 01/06/2018  .  Abdominal pain 01/06/2018  . Vitamin D deficiency 01/06/2018  . HLD (hyperlipidemia) 01/06/2018  . GERD (gastroesophageal reflux disease) 01/06/2018  . HTN (hypertension) 01/06/2018    Past Surgical History:  Procedure Laterality Date  . ANTERIOR APPROACH HEMI HIP ARTHROPLASTY Left 01/28/2018   Procedure: DIRECT ANTERIOR APPROACH HEMI HIP ARTHROPLASTY;  Surgeon: Tarry Kos, MD;  Location: [facility];  Service: Orthopedics;  Laterality: Left;  . ESOPHAGOGASTRODUODENOSCOPY (EGD) WITH PROPOFOL N/A 01/22/2018   Procedure: ESOPHAGOGASTRODUODENOSCOPY (EGD) WITH PROPOFOL;  Surgeon: Graylin Shiver, MD;  Location[facility];  Service: Endoscopy;  Laterality: N/A;  . UNKNOWN SURGICAL HISTORY       OB History   None      Home Medications    Prior to Admission medications   Medication Sig Start Date End Date Taking? Authorizing Provider  Amino Acids-Protein Hydrolys (FEEDING SUPPLEMENT, PRO-STAT SUGAR FREE 64,) LIQD Take 30 mLs by mouth daily.    [provider]  amLODipine (NORVASC) 10 MG tablet Take 1 tablet (10 mg total) by mouth daily. 03/11/18   Darlin Drop, DO  Artificial Tear Solution (GENTEAL TEARS) 0.1-0.2-0.3 % SOLN Place 1-2 drops into the left eye 2 (two) times daily as needed (itching).    [provider]  Dextromethorphan-guaiFENesin (ROBITUSSIN COUGH+CHEST CONG DM) 5-100 MG/5ML LIQD Take 15 mLs by mouth 3 (three) times daily.    [provider]  docusate sodium (COLACE) 100 MG capsule Take 100  mg by mouth at bedtime.    [provider]  enoxaparin (LOVENOX) 40 MG/0.4ML injection Inject 0.4 mLs (40 mg total) into the skin daily. 01/28/18   Tarry Kos, MD  ipratropium-albuterol (DUONEB) 0.5-2.5 (3) MG/3ML SOLN Take 3 mLs by nebulization 2 (two) times daily.    [provider]  irbesartan (AVAPRO) 75 MG tablet Take 37.5 mg by mouth at bedtime. TAKES 1/2 TABLET    [provider]  meclizine (ANTIVERT) 12.5 MG tablet Take 12.5 mg  by mouth 3 (three) times daily as needed for dizziness.    [provider]  pantoprazole (PROTONIX) 40 MG tablet Take 1 tablet (40 mg total) by mouth 2 (two) times daily. 01/24/18   Purohit, Salli Quarry, MD  polyethylene glycol (MIRALAX / GLYCOLAX) packet Take 17 g by mouth daily. 01/08/18   Lenox Ponds, MD  travoprost, benzalkonium, (TRAVATAN) 0.004 % ophthalmic solution Place 1 drop into the left eye at bedtime.    [provider]    Family History No family history on file.  Social History Social History   Tobacco Use  . Smoking status: Never Smoker  . Smokeless tobacco: Never Used  Substance Use Topics  . Alcohol use: Not Currently    Frequency: Never  . Drug use: Never     Allergies   Bactrim [sulfamethoxazole-trimethoprim] and Levaquin [levofloxacin]   Review of Systems Review of Systems  Ten systems reviewed and are negative for acute change, except as noted in the HPI.   Physical Exam Updated Vital Signs BP (!) 143/75 (BP Location: Right Arm)   Pulse 87   Temp 98.1 F (36.7 C) (Oral)   Resp 18   Ht 5\' 2"  (1.575 m)   Wt 65.8 kg (145 lb)   SpO2 95%   BMI 26.52 kg/m   Physical Exam  Constitutional: She is oriented to person, place, and time. She appears well-developed and well-nourished. No distress.  HENT:  Head: Normocephalic and atraumatic.  Eyes: Pupils are equal, round, and reactive to light. Conjunctivae and EOM are normal. No scleral icterus.  Neck: Normal range of motion.  Cardiovascular: Normal rate, regular rhythm and normal heart sounds. Exam reveals no gallop and no friction rub.  No murmur heard. Pulmonary/Chest: Effort normal and breath sounds normal. No respiratory distress.  Abdominal: Soft. Bowel sounds are normal. She exhibits no distension and no mass. There is no tenderness. There is no guarding.  Musculoskeletal:  No overt deformity.  Tenderness along the lateral proximal aspect of the right thigh and hip.  Bilateral  DP and PT pulses 2+ .  Sensation.  Neurological: She is alert and oriented to person, place, and time.  Skin: Skin is warm and dry. She is not diaphoretic.  Psychiatric: Her behavior is normal.  Nursing note and vitals reviewed.    ED Treatments / Results  Labs (all labs ordered are listed, but only abnormal results are displayed) Labs Reviewed  BASIC METABOLIC PANEL  CBC WITH DIFFERENTIAL/PLATELET  PROTIME-INR  TYPE AND SCREEN    EKG None  Radiology No results found.  Procedures Procedures (including critical care time)  Medications Ordered in ED Medications  fentaNYL (SUBLIMAZE) injection 50 mcg (has no administration in time range)  ondansetron (ZOFRAN) injection 4 mg (has no administration in time range)     Initial Impression / Assessment and Plan / ED Course  I have reviewed the triage vital signs and the nursing notes.  Pertinent labs & imaging results that were available  during my care of the patient were reviewed by me and considered in my medical decision making (see chart for details).     Patient with mechanical fall and hip fracture.  She will be admitted by the hospitalist service.  Patient seen in the ER by Dr. Magnus IvanBlackman who does not plan on operation today.  Patient pain well controlled here in the emergency department she appears appropriate for admission at this time.  Final Clinical Impressions(s) / ED Diagnoses   Final diagnoses:  None    ED Discharge Orders    None       Arthor CaptainHarris, Isobella Ascher, PA-C 04/10/18 1720    Jacalyn LefevreHaviland, Julie, MD 04/11/18 1531

## 2018-04-11 ENCOUNTER — Inpatient Hospital Stay (HOSPITAL_COMMUNITY): Admitting: Anesthesiology

## 2018-04-11 ENCOUNTER — Encounter (HOSPITAL_COMMUNITY): Payer: Self-pay | Admitting: Certified Registered"

## 2018-04-11 ENCOUNTER — Encounter (HOSPITAL_COMMUNITY)
Admission: EM | Disposition: A | Discharge status: Skilled Nursing Facility | Payer: Self-pay | Source: Home / Self Care | Attending: Internal Medicine

## 2018-04-11 ENCOUNTER — Inpatient Hospital Stay (HOSPITAL_COMMUNITY)

## 2018-04-11 DIAGNOSIS — S72001D Fracture of unspecified part of neck of right femur, subsequent encounter for closed fracture with routine healing: Secondary | ICD-10-CM

## 2018-04-11 DIAGNOSIS — S72001A Fracture of unspecified part of neck of right femur, initial encounter for closed fracture: Secondary | ICD-10-CM

## 2018-04-11 HISTORY — PX: INTRAMEDULLARY (IM) NAIL INTERTROCHANTERIC: SHX5875

## 2018-04-11 LAB — LIPID PANEL
Cholesterol: 202 mg/dL — ABNORMAL HIGH (ref 0–200)
HDL: 34 mg/dL — AB (ref >40)
LDL Cholesterol: 128 mg/dL — ABNORMAL HIGH (ref 0–99)
Total CHOL/HDL Ratio: 5.9 RATIO
Triglycerides: 199 mg/dL — ABNORMAL HIGH (ref <150)
VLDL: 40 mg/dL (ref 0–40)

## 2018-04-11 LAB — COMPREHENSIVE METABOLIC PANEL
ALT: 10 U/L — ABNORMAL LOW (ref 14–54)
AST: 18 U/L (ref 15–41)
Albumin: 2.9 g/dL — ABNORMAL LOW (ref 3.5–5.0)
Alkaline Phosphatase: 94 U/L (ref 38–126)
Anion gap: 9 (ref 5–15)
BUN: 21 mg/dL — AB (ref 6–20)
CHLORIDE: 105 mmol/L (ref 101–111)
CO2: 27 mmol/L (ref 22–32)
CREATININE: 1.28 mg/dL — AB (ref 0.44–1.00)
Calcium: 9 mg/dL (ref 8.9–10.3)
GFR, EST AFRICAN AMERICAN: 40 mL/min — AB (ref >60)
GFR, EST NON AFRICAN AMERICAN: 34 mL/min — AB (ref >60)
GLUCOSE: 97 mg/dL (ref 65–99)
POTASSIUM: 4.4 mmol/L (ref 3.5–5.1)
Sodium: 141 mmol/L (ref 135–145)
TOTAL PROTEIN: 6.1 g/dL — AB (ref 6.5–8.1)
Total Bilirubin: 0.7 mg/dL (ref 0.3–1.2)

## 2018-04-11 LAB — CBC
HEMATOCRIT: 35.3 % — AB (ref 36.0–46.0)
Hemoglobin: 10.5 g/dL — ABNORMAL LOW (ref 12.0–15.0)
MCH: 24.8 pg — ABNORMAL LOW (ref 26.0–34.0)
MCHC: 29.7 g/dL — AB (ref 30.0–36.0)
MCV: 83.5 fL (ref 78.0–100.0)
PLATELETS: 250 10*3/uL (ref 150–400)
RBC: 4.23 MIL/uL (ref 3.87–5.11)
RDW: 15.3 % (ref 11.5–15.5)
WBC: 9.7 10*3/uL (ref 4.0–10.5)

## 2018-04-11 SURGERY — FIXATION, FRACTURE, INTERTROCHANTERIC, WITH INTRAMEDULLARY ROD
Anesthesia: General | Laterality: Right

## 2018-04-11 MED ORDER — ONDANSETRON HCL 4 MG PO TABS: 4.0000 mg | ORAL_TABLET | Freq: Four times a day (QID) | ORAL | Status: DC | PRN

## 2018-04-11 MED ORDER — HYDROCODONE-ACETAMINOPHEN 7.5-325 MG PO TABS
ORAL_TABLET | ORAL | Status: AC
  Filled 2018-04-11: qty 1

## 2018-04-11 MED ORDER — TRANEXAMIC ACID 1000 MG/10ML IV SOLN
1000.0000 mg | INTRAVENOUS | Status: AC
  Administered 2018-04-11: 1000 mg via INTRAVENOUS
  Filled 2018-04-11: qty 10

## 2018-04-11 MED ORDER — POVIDONE-IODINE 10 % EX SWAB: 2.0000 "application " | Freq: Once | CUTANEOUS | Status: DC

## 2018-04-11 MED ORDER — SUGAMMADEX SODIUM 200 MG/2ML IV SOLN
INTRAVENOUS | Status: DC | PRN
  Administered 2018-04-11: 130 mg via INTRAVENOUS

## 2018-04-11 MED ORDER — SODIUM CHLORIDE 0.9 % IV SOLN
INTRAVENOUS | Status: DC
  Administered 2018-04-11 – 2018-04-12 (×2): via INTRAVENOUS

## 2018-04-11 MED ORDER — FENTANYL CITRATE (PF) 250 MCG/5ML IJ SOLN
INTRAMUSCULAR | Status: AC
  Filled 2018-04-11: qty 5

## 2018-04-11 MED ORDER — CEFAZOLIN SODIUM-DEXTROSE 2-4 GM/100ML-% IV SOLN
INTRAVENOUS | Status: AC
  Filled 2018-04-11: qty 100

## 2018-04-11 MED ORDER — ROCURONIUM BROMIDE 10 MG/ML (PF) SYRINGE
PREFILLED_SYRINGE | INTRAVENOUS | Status: DC | PRN
  Administered 2018-04-11: 40 mg via INTRAVENOUS

## 2018-04-11 MED ORDER — OXYCODONE-ACETAMINOPHEN 5-325 MG PO TABS: 1.0000 | ORAL_TABLET | ORAL | 0 refills | Status: AC | PRN

## 2018-04-11 MED ORDER — HYDROCODONE-ACETAMINOPHEN 7.5-325 MG PO TABS
1.0000 | ORAL_TABLET | ORAL | Status: DC | PRN
  Administered 2018-04-11: 2 via ORAL

## 2018-04-11 MED ORDER — CALCIUM CARBONATE-VITAMIN D 500-200 MG-UNIT PO TABS: 1.0000 | ORAL_TABLET | Freq: Three times a day (TID) | ORAL | 12 refills | Status: AC

## 2018-04-11 MED ORDER — METOCLOPRAMIDE HCL 5 MG/ML IJ SOLN: 5.0000 mg | Freq: Three times a day (TID) | INTRAMUSCULAR | Status: DC | PRN

## 2018-04-11 MED ORDER — ONDANSETRON HCL 4 MG/2ML IJ SOLN: 4.0000 mg | Freq: Four times a day (QID) | INTRAMUSCULAR | Status: DC | PRN

## 2018-04-11 MED ORDER — ROCURONIUM BROMIDE 50 MG/5ML IV SOLN
INTRAVENOUS | Status: AC
  Filled 2018-04-11: qty 1

## 2018-04-11 MED ORDER — FENTANYL CITRATE (PF) 100 MCG/2ML IJ SOLN
INTRAMUSCULAR | Status: AC
  Filled 2018-04-11: qty 2

## 2018-04-11 MED ORDER — METOCLOPRAMIDE HCL 5 MG PO TABS: 5.0000 mg | ORAL_TABLET | Freq: Three times a day (TID) | ORAL | Status: DC | PRN

## 2018-04-11 MED ORDER — LIDOCAINE 2% (20 MG/ML) 5 ML SYRINGE
INTRAMUSCULAR | Status: DC | PRN
  Administered 2018-04-11: 30 mg via INTRAVENOUS

## 2018-04-11 MED ORDER — PHENYLEPHRINE 40 MCG/ML (10ML) SYRINGE FOR IV PUSH (FOR BLOOD PRESSURE SUPPORT)
PREFILLED_SYRINGE | INTRAVENOUS | Status: AC
  Filled 2018-04-11: qty 10

## 2018-04-11 MED ORDER — MORPHINE SULFATE (PF) 2 MG/ML IV SOLN: 0.5000 mg | INTRAVENOUS | Status: DC | PRN

## 2018-04-11 MED ORDER — FENTANYL CITRATE (PF) 100 MCG/2ML IJ SOLN
INTRAMUSCULAR | Status: DC | PRN
  Administered 2018-04-11: 25 ug via INTRAVENOUS
  Administered 2018-04-11: 50 ug via INTRAVENOUS

## 2018-04-11 MED ORDER — ONDANSETRON HCL 4 MG/2ML IJ SOLN
INTRAMUSCULAR | Status: AC
  Filled 2018-04-11: qty 2

## 2018-04-11 MED ORDER — SUGAMMADEX SODIUM 200 MG/2ML IV SOLN
INTRAVENOUS | Status: AC
  Filled 2018-04-11: qty 2

## 2018-04-11 MED ORDER — CEFAZOLIN SODIUM-DEXTROSE 2-4 GM/100ML-% IV SOLN
2.0000 g | Freq: Two times a day (BID) | INTRAVENOUS | Status: AC
  Administered 2018-04-12 – 2018-04-13 (×3): 2 g via INTRAVENOUS
  Filled 2018-04-11 (×3): qty 100

## 2018-04-11 MED ORDER — PHENYLEPHRINE 40 MCG/ML (10ML) SYRINGE FOR IV PUSH (FOR BLOOD PRESSURE SUPPORT)
PREFILLED_SYRINGE | INTRAVENOUS | Status: DC | PRN
  Administered 2018-04-11: 120 ug via INTRAVENOUS
  Administered 2018-04-11: 80 ug via INTRAVENOUS

## 2018-04-11 MED ORDER — CEFAZOLIN SODIUM-DEXTROSE 2-4 GM/100ML-% IV SOLN
2.0000 g | INTRAVENOUS | Status: AC
  Administered 2018-04-11: 2 g via INTRAVENOUS

## 2018-04-11 MED ORDER — FENTANYL CITRATE (PF) 100 MCG/2ML IJ SOLN
25.0000 ug | INTRAMUSCULAR | Status: DC | PRN
  Administered 2018-04-11 (×2): 25 ug via INTRAVENOUS

## 2018-04-11 MED ORDER — KETOROLAC TROMETHAMINE 15 MG/ML IJ SOLN
7.5000 mg | Freq: Four times a day (QID) | INTRAMUSCULAR | Status: AC
  Administered 2018-04-11 – 2018-04-12 (×4): 7.5 mg via INTRAVENOUS
  Filled 2018-04-11 (×3): qty 1

## 2018-04-11 MED ORDER — HYDROCODONE-ACETAMINOPHEN 5-325 MG PO TABS
1.0000 | ORAL_TABLET | ORAL | Status: DC | PRN
  Administered 2018-04-12 – 2018-04-13 (×2): 1 via ORAL
  Filled 2018-04-11 (×2): qty 1

## 2018-04-11 MED ORDER — KETOROLAC TROMETHAMINE 15 MG/ML IJ SOLN
INTRAMUSCULAR | Status: AC
  Filled 2018-04-11: qty 1

## 2018-04-11 MED ORDER — EPHEDRINE SULFATE 50 MG/ML IJ SOLN
INTRAMUSCULAR | Status: AC
  Filled 2018-04-11: qty 1

## 2018-04-11 MED ORDER — DOCUSATE SODIUM 100 MG PO CAPS
100.0000 mg | ORAL_CAPSULE | Freq: Two times a day (BID) | ORAL | Status: DC
  Administered 2018-04-12 – 2018-04-13 (×3): 100 mg via ORAL
  Filled 2018-04-11 (×3): qty 1

## 2018-04-11 MED ORDER — PHENOL 1.4 % MT LIQD: 1.0000 | OROMUCOSAL | Status: DC | PRN

## 2018-04-11 MED ORDER — LIDOCAINE 2% (20 MG/ML) 5 ML SYRINGE
INTRAMUSCULAR | Status: AC
  Filled 2018-04-11: qty 5

## 2018-04-11 MED ORDER — ENOXAPARIN SODIUM 40 MG/0.4ML ~~LOC~~ SOLN: 40.0000 mg | Freq: Every day | SUBCUTANEOUS | 0 refills | Status: DC

## 2018-04-11 MED ORDER — OXYCODONE HCL 5 MG PO TABS: 5.0000 mg | ORAL_TABLET | Freq: Once | ORAL | Status: DC | PRN

## 2018-04-11 MED ORDER — ONDANSETRON HCL 4 MG/2ML IJ SOLN
INTRAMUSCULAR | Status: DC | PRN
  Administered 2018-04-11: 4 mg via INTRAVENOUS

## 2018-04-11 MED ORDER — ZINC SULFATE 220 (50 ZN) MG PO CAPS: 220.0000 mg | ORAL_CAPSULE | Freq: Every day | ORAL | 0 refills | Status: AC

## 2018-04-11 MED ORDER — LACTATED RINGERS IV SOLN
INTRAVENOUS | Status: DC
  Administered 2018-04-11: 17:00:00 via INTRAVENOUS

## 2018-04-11 MED ORDER — ALUM & MAG HYDROXIDE-SIMETH 200-200-20 MG/5ML PO SUSP: 30.0000 mL | ORAL | Status: DC | PRN

## 2018-04-11 MED ORDER — PROPOFOL 10 MG/ML IV BOLUS
INTRAVENOUS | Status: AC
  Filled 2018-04-11: qty 20

## 2018-04-11 MED ORDER — OXYCODONE HCL 5 MG/5ML PO SOLN: 5.0000 mg | Freq: Once | ORAL | Status: DC | PRN

## 2018-04-11 MED ORDER — PROPOFOL 10 MG/ML IV BOLUS
INTRAVENOUS | Status: DC | PRN
  Administered 2018-04-11: 50 mg via INTRAVENOUS

## 2018-04-11 MED ORDER — ENOXAPARIN SODIUM 30 MG/0.3ML ~~LOC~~ SOLN
30.0000 mg | SUBCUTANEOUS | Status: DC
  Administered 2018-04-12 – 2018-04-13 (×2): 30 mg via SUBCUTANEOUS
  Filled 2018-04-11 (×2): qty 0.3

## 2018-04-11 MED ORDER — MENTHOL 3 MG MT LOZG: 1.0000 | LOZENGE | OROMUCOSAL | Status: DC | PRN

## 2018-04-11 MED ORDER — ACETAMINOPHEN 325 MG PO TABS: 325.0000 mg | ORAL_TABLET | Freq: Four times a day (QID) | ORAL | Status: DC | PRN

## 2018-04-11 SURGICAL SUPPLY — 39 items
BNDG COHESIVE 4X5 TAN NS LF (GAUZE/BANDAGES/DRESSINGS) ×3 IMPLANT
BNDG COHESIVE 6X5 TAN STRL LF (GAUZE/BANDAGES/DRESSINGS) IMPLANT
BNDG GAUZE ELAST 4 BULKY (GAUZE/BANDAGES/DRESSINGS) ×3 IMPLANT
COVER PERINEAL POST (MISCELLANEOUS) ×3 IMPLANT
COVER SURGICAL LIGHT HANDLE (MISCELLANEOUS) ×3 IMPLANT
DRAPE STERI IOBAN 125X83 (DRAPES) ×3 IMPLANT
DRSG MEPILEX BORDER 4X4 (GAUZE/BANDAGES/DRESSINGS) ×3 IMPLANT
DRSG MEPILEX BORDER 4X8 (GAUZE/BANDAGES/DRESSINGS) ×3 IMPLANT
DRSG PAD ABDOMINAL 8X10 ST (GAUZE/BANDAGES/DRESSINGS) ×6 IMPLANT
DURAPREP 26ML APPLICATOR (WOUND CARE) ×3 IMPLANT
ELECT REM PT RETURN 9FT ADLT (ELECTROSURGICAL) ×3
ELECTRODE REM PT RTRN 9FT ADLT (ELECTROSURGICAL) ×1 IMPLANT
GLOVE BIOGEL PI IND STRL 7.0 (GLOVE) ×1 IMPLANT
GLOVE BIOGEL PI INDICATOR 7.0 (GLOVE) ×2
GLOVE ECLIPSE 7.0 STRL STRAW (GLOVE) ×3 IMPLANT
GLOVE SKINSENSE NS SZ7.5 (GLOVE) ×4
GLOVE SKINSENSE STRL SZ7.5 (GLOVE) ×2 IMPLANT
GOWN STRL REIN XL XLG (GOWN DISPOSABLE) ×3 IMPLANT
GUIDE PIN 3.2X343 (PIN) ×1
GUIDE PIN 3.2X343MM (PIN) ×2
KIT BASIN OR (CUSTOM PROCEDURE TRAY) ×3 IMPLANT
KIT TURNOVER KIT B (KITS) ×3 IMPLANT
MANIFOLD NEPTUNE II (INSTRUMENTS) ×3 IMPLANT
NAIL TRIGEN 10MMX36CM-125 RT (Nail) ×3 IMPLANT
NS IRRIG 1000ML POUR BTL (IV SOLUTION) ×3 IMPLANT
PACK GENERAL/GYN (CUSTOM PROCEDURE TRAY) ×3 IMPLANT
PAD ARMBOARD 7.5X6 YLW CONV (MISCELLANEOUS) ×6 IMPLANT
PAD CAST 4YDX4 CTTN HI CHSV (CAST SUPPLIES) ×2 IMPLANT
PADDING CAST COTTON 4X4 STRL (CAST SUPPLIES) ×4
PIN GUIDE 3.2X343MM (PIN) ×1 IMPLANT
SCREW LAG COMPR KIT 90/85 (Screw) ×3 IMPLANT
STAPLER VISISTAT 35W (STAPLE) ×3 IMPLANT
SUT VIC AB 0 CT1 27 (SUTURE) ×2
SUT VIC AB 0 CT1 27XBRD ANBCTR (SUTURE) ×1 IMPLANT
SUT VIC AB 2-0 CT1 27 (SUTURE) ×2
SUT VIC AB 2-0 CT1 TAPERPNT 27 (SUTURE) ×1 IMPLANT
TOWEL OR 17X24 6PK STRL BLUE (TOWEL DISPOSABLE) ×3 IMPLANT
TOWEL OR 17X26 10 PK STRL BLUE (TOWEL DISPOSABLE) ×3 IMPLANT
WATER STERILE IRR 1000ML POUR (IV SOLUTION) ×3 IMPLANT

## 2018-04-11 NOTE — Progress Notes (Signed)
PROGRESS NOTE                                                                                                                                                                                                             Patient Demographics:    Donna Summers, is a 84 y.o. female, DOB - July 26, 1920, NWG:[id number]  Admit date - 04/10/2018   Admitting Physician Pearson GrippeJames Kim, MD  Outpatient Primary MD for the patient is Wanda PlumpPaz, Jose E, MD  LOS - 1   Chief Complaint  Patient presents with  . Fall  . Hip Pain       Brief Narrative   84 y.o. female , w hypertension, ckd stage3,  Genella RifeGerd, prior L hip fracture that is post surgical repair in April by Dr. Roda ShuttersXU, who presents with right femoral neck fracture s/p mechanical fall PTA.  Recently had multiple falls, she recently had left hip fracture status post surgical repair.   Subjective:    Donna SlaterAgnes Summers today verbal   Assessment  & Plan :    Principal Problem:   Femoral neck fracture (HCC) Active Problems:   GERD (gastroesophageal reflux disease)   HTN (hypertension)   CKD (chronic kidney disease), stage III (HCC)   Anemia    R femoral neck fracture  -Discussed with son and daughter-in-law, patient under hospice care, and family's main goal is patient comfort, she is in significant amount of pain, I have discussed with him, and I have suggested to proceed with minimal surgery as part of pain control otherwise she will be having significant amount of pain and discomfort have discussed with orthopedic, plan for fracture fixation. -Continue with PRN pain medication, no further work-up indicated before surgery  Hypertension -Blood pressure acceptable, continue with home medication amlodipine and irbesartan  CKD stage 3 -Renal function stable at baseline, continue to monitor   CVA (old lacunar infarcts on CT ) -Continue with aspirin  Protein calorie malnutrition - Cont Prostat 30mL po  bid  Anemia -Hemoglobin at baseline at baseline  Glaucoma Cont Alphagan Cont Travatan Cont Trusopt  Gerd Cont protonix 40mg  po qday  Goals of care -I have discussed with son and daughter-in-law at bedside, this is the patient's fifth hospitalization in 6133-month, she is under hospice care, patient has significant amount of pain and discomfort secondary to hip fracture, and family  agreeable for surgery as a part to minimize the amount of her pain .    Code Status : DNR  Family Communication  : son and daughter in law at bedside  Disposition Plan  : back to SNF  Consults  :  ortho  Procedures  : none  DVT Prophylaxis  :  lovenox  Lab Results  Component Value Date   PLT 250 04/11/2018    Antibiotics  :    Anti-infectives (From admission, onward)   None        Objective:   Vitals:   04/10/18 1550 04/10/18 2100 04/11/18 0434 04/11/18 0449  BP: (!) 176/81 (!) 163/70 (!) 160/79   Pulse: 91 88 80   Resp:  16 17   Temp: 98.1 F (36.7 C) 98.6 F (37 C) 97.9 F (36.6 C)   TempSrc: Oral Oral Axillary   SpO2: 95% 99% 94%   Weight:    65.5 kg (144 lb 6.4 oz)  Height:        Wt Readings from Last 3 Encounters:  04/11/18 65.5 kg (144 lb 6.4 oz)  03/10/18 66.2 kg (146 lb)  02/11/18 71.7 kg (158 lb)     Intake/Output Summary (Last 24 hours) at 04/11/2018 1253 Last data filed at 04/11/2018 0435 Gross per 24 hour  Intake 480 ml  Output 300 ml  Net 180 ml     Physical Exam  Sleeping comfortably, but she is in significant pain and discomfort whenever she tries to move, confused, demented Symmetrical Chest wall movement, Good air movement bilaterally, CTAB RRR,No Gallops,Rubs or new Murmurs, No Parasternal Heave +ve B.Sounds, Abd Soft, No tenderness, No rebound - guarding or rigidity. Right lower extremity shortened, pulses felt bilaterally   Data Review:    CBC Recent Labs  Lab 04/10/18 0957 04/11/18 0413  WBC 13.2* 9.7  HGB 10.8* 10.5*  HCT  35.9* 35.3*  PLT 296 250  MCV 82.5 83.5  MCH 24.8* 24.8*  MCHC 30.1 29.7*  RDW 15.2 15.3  LYMPHSABS 2.7  --   MONOABS 1.1*  --   EOSABS 0.1  --   BASOSABS 0.0  --     Chemistries  Recent Labs  Lab 04/10/18 0957 04/11/18 0413  NA 140 141  K 3.9 4.4  CL 108 105  CO2 23 27  GLUCOSE 114* 97  BUN 22* 21*  CREATININE 1.40* 1.28*  CALCIUM 9.1 9.0  AST  --  18  ALT  --  10*  ALKPHOS  --  94  BILITOT  --  0.7   ------------------------------------------------------------------------------------------------------------------ Recent Labs    04/11/18 0413  CHOL 202*  HDL 34*  LDLCALC 128*  TRIG 199*  CHOLHDL 5.9    No results found for: HGBA1C ------------------------------------------------------------------------------------------------------------------ Recent Labs    04/10/18 1329  TSH 2.240   ------------------------------------------------------------------------------------------------------------------ No results for input(s): VITAMINB12, FOLATE, FERRITIN, TIBC, IRON, RETICCTPCT in the last 72 hours.  Coagulation profile Recent Labs  Lab 04/10/18 0957  INR 1.14    No results for input(s): DDIMER in the last 72 hours.  Cardiac Enzymes No results for input(s): CKMB, TROPONINI, MYOGLOBIN in the last 168 hours.  Invalid input(s): CK ------------------------------------------------------------------------------------------------------------------ No results found for: BNP  Inpatient Medications  Scheduled Meds: . amLODipine  10 mg Oral Daily  . aspirin EC  81 mg Oral Daily  . brimonidine  1 drop Left Eye TID  . docusate sodium  100 mg Oral QHS  . dorzolamide  1 drop Left Eye TID  .  enoxaparin (LOVENOX) injection  30 mg Subcutaneous Q24H  . feeding supplement (PRO-STAT SUGAR FREE 64)  30 mL Oral Daily  . irbesartan  37.5 mg Oral QHS  . latanoprost  1 drop Left Eye QHS  . pantoprazole  40 mg Oral BID  . polyethylene glycol  17 g Oral Daily  .  sodium chloride flush  3 mL Intravenous Q12H   Continuous Infusions: . sodium chloride     PRN Meds:.sodium chloride, acetaminophen **OR** acetaminophen, fentaNYL (SUBLIMAZE) injection, HYDROcodone-acetaminophen, ipratropium-albuterol, meclizine, ondansetron (ZOFRAN) IV, polyvinyl alcohol, sodium chloride flush  Micro Results Recent Results (from the past 240 hour(s))  MRSA PCR Screening     Status: None   Collection Time: 04/10/18  1:38 PM  Result Value Ref Range Status   MRSA by PCR NEGATIVE NEGATIVE Final    Comment:        The GeneXpert MRSA Assay (FDA approved for NASAL specimens only), is one component of a comprehensive MRSA colonization surveillance program. It is not intended to diagnose MRSA infection nor to guide or monitor treatment for MRSA infections. Performed at [facility], [address], [place], [place] [address]     Radiology Reports Dg Chest 1 View  Result Date: 04/10/2018 CLINICAL DATA:  84 year old female with history of unwitnessed fall at 5:40 a.m. this morning. Femoral neck fracture. EXAM: CHEST  1 VIEW COMPARISON:  Chest x-ray 03/07/2018. FINDINGS: Diffuse interstitial prominence and peribronchial cuffing, similar to prior examinations. No confluent consolidative airspace disease. Blunting of left costophrenic sulcus, which may suggest a small left pleural effusion. No right pleural effusion. No pneumothorax. Heart size is upper limits of normal. Upper mediastinal contours are within normal limits. Aortic atherosclerosis. Multiple old healed right-sided posterolateral rib fractures with mild posttraumatic deformity. IMPRESSION: 1. Probable small left pleural effusion. 2. Chronic interstitial prominence and mild diffuse peribronchial cuffing appear slightly worse than prior examinations. This may suggest an associated acute bronchitis. 3. Aortic atherosclerosis. 4. Multiple old posterolateral right-sided rib fractures with posttraumatic deformity.  Electronically Signed   By: Trudie Reed M.D.   On: 04/10/2018 11:23   Dg Pelvis 1-2 Views  Result Date: 04/10/2018 CLINICAL DATA:  Pt arrives via EMS from [facility] with reports of a witnessed fall at 5:40 AM. Pt endorses right hip pain, facility reports Xray showed fx of R femoral neck with impaction without displacement. EXAM: PELVIS - 1-2 VIEW COMPARISON:  02/11/2018. FINDINGS: There is a nondisplaced fracture of the subcapital right femoral neck. There is no significant angulation. No other fracture. Left hip prosthesis appears well seated and aligned. Right hip joint, SI joints and symphysis pubis are normally aligned. Bones are diffusely demineralized. IMPRESSION: Subcapital fracture of the right femoral neck. Electronically Signed   By: Amie Portland M.D.   On: 04/10/2018 11:29   Ct Hip Right Wo Contrast  Result Date: 04/11/2018 CLINICAL DATA:  Right hip pain status post fall EXAM: CT OF THE RIGHT HIP WITHOUT CONTRAST TECHNIQUE: Multidetector CT imaging of the right hip was performed according to the standard protocol. Multiplanar CT image reconstructions were also generated. COMPARISON:  None. FINDINGS: Bones/Joint/Cartilage Generalized osteopenia. Acute impacted right subcapital femoral fracture. Acute comminuted intertrochanteric fracture. No other acute fracture or dislocation. No dislocation. Mild osteoarthritis of the right hip. Ligaments Suboptimally assessed by CT. Muscles and Tendons Muscles are normal.  No intramuscular fluid collection. Soft tissues No fluid collection or hematoma. Peripheral vascular atherosclerotic disease. IMPRESSION: 1. Acute impacted right subcapital femoral fracture. Acute  comminuted right intertrochanteric fracture. Electronically Signed   By: Elige Ko   On: 04/11/2018 12:20   Dg Femur, Min 2 Views Right  Result Date: 04/10/2018 CLINICAL DATA:  Pt arrives via EMS from [facility] with reports of a witnessed fall at 5:40 AM. Pt  endorses right hip pain, facility reports Xray showed fx of R femoral neck with impaction without displacement. EXAM: RIGHT FEMUR 2 VIEWS COMPARISON:  Current radiograph of the pelvis. FINDINGS: Nondisplaced subcapital fracture of the right femoral neck. No other fractures.  Hip and knee joints are normally aligned. Soft tissues are unremarkable. IMPRESSION: Nondisplaced subcapital fracture of the right femoral neck. Electronically Signed   By: Amie Portland M.D.   On: 04/10/2018 11:28       Huey Bienenstock M.D on 04/11/2018 at 12:53 PM  Between 7am to 7pm - Pager - [phone]  After 7pm go to [email or web address] - password [email or web address]  [facility] -  Office  [phone]

## 2018-04-11 NOTE — Progress Notes (Signed)
I went by the patient's room this morning in hopes of speaking with family members but none were available.  I will attempt to contact the family to discuss their wishes for the patient.

## 2018-04-11 NOTE — Anesthesia Postprocedure Evaluation (Signed)
Anesthesia Post Note  Patient: Donna Summers  Procedure(s) Performed: INTRAMEDULLARY (IM) NAIL INTERTROCHANTRIC (Right )     Patient location during evaluation: PACU Anesthesia Type: General Level of consciousness: awake Pain management: pain level controlled Vital Signs Assessment: post-procedure vital signs reviewed and stable Respiratory status: spontaneous breathing Cardiovascular status: stable Anesthetic complications: no    Last Vitals:  Vitals:   04/11/18 1938 04/11/18 2017  BP: (!) 143/64 (!) 142/53  Pulse: (!) 102 91  Resp: 18 15  Temp:  36.6 C  SpO2: 96% 90%    Last Pain:  Vitals:   04/11/18 2017  TempSrc: Oral  PainSc:                  Dorrance Sellick

## 2018-04-11 NOTE — Plan of Care (Signed)
  Problem: Education: Goal: Knowledge of General Education information will improve 04/11/2018 1320 by Nelda MarseilleNjiriri, Appolonia Ackert G, RN Outcome: Progressing 04/11/2018 1034 by Nelda MarseilleNjiriri, Adiel Mcnamara G, RN Outcome: Progressing   Problem: Activity: Goal: Risk for activity intolerance will decrease 04/11/2018 1320 by Nelda MarseilleNjiriri, Nanako Stopher G, RN Outcome: Progressing 04/11/2018 1034 by Nelda MarseilleNjiriri, Devanie Galanti G, RN Outcome: Progressing   Problem: Nutrition: Goal: Adequate nutrition will be maintained 04/11/2018 1320 by Nelda MarseilleNjiriri, Leilanie Rauda G, RN Outcome: Progressing 04/11/2018 1034 by Nelda MarseilleNjiriri, Lestat Golob G, RN Outcome: Progressing   Problem: Elimination: Goal: Will not experience complications related to bowel motility Outcome: Progressing   Problem: Pain Managment: Goal: General experience of comfort will improve Outcome: Progressing   Problem: Safety: Goal: Ability to remain free from injury will improve Outcome: Progressing

## 2018-04-11 NOTE — Consult Note (Signed)
ORTHOPAEDIC CONSULTATION  REQUESTING PHYSICIAN: Elgergawy, Leana Roe, MD  Chief Complaint: Right femoral neck fracture  HPI: Donna Summers is a 84 y.o. female who presents with right femoral neck fracture s/p mechanical fall PTA. I performed a left hemi hip back in April from which she recovered well.  She has had multiple falls in the interim.  She has advanced dementia.  She's currently in hospice care.  Patient is very uncomfortable and in significant pain due to the fracture.  Past Medical History:  Diagnosis Date  . Anemia 04/10/2018  . Constipation   . GERD (gastroesophageal reflux disease)   . Glaucoma   . History of carotid artery disease   . Hyperlipidemia   . Hypertension   . Stroke [facility] 03/07/2018   old lacunar infarcts on CT scan brain 03/07/2018  . UTI (urinary tract infection)   . Vertigo    Past Surgical History:  Procedure Laterality Date  . ANTERIOR APPROACH HEMI HIP ARTHROPLASTY Left 01/28/2018   Procedure: DIRECT ANTERIOR APPROACH HEMI HIP ARTHROPLASTY;  Surgeon: Tarry Kos, MD;  Location: [facility] OR;  Service: Orthopedics;  Laterality: Left;  . ESOPHAGOGASTRODUODENOSCOPY (EGD) WITH PROPOFOL N/A 01/22/2018   Procedure: ESOPHAGOGASTRODUODENOSCOPY (EGD) WITH PROPOFOL;  Surgeon: Graylin Shiver, MD;  Location: [facility] ENDOSCOPY;  Service: Endoscopy;  Laterality: N/A;  . UNKNOWN SURGICAL HISTORY     Social History   Socioeconomic History  . Marital status: Widowed    Spouse name: Not on file  . Number of children: Not on file  . Years of education: Not on file  . Highest education level: Not on file  Occupational History  . Not on file  Social Needs  . Financial resource strain: Not on file  . Food insecurity:    Worry: Not on file    Inability: Not on file  . Transportation needs:    Medical: Not on file    Non-medical: Not on file  Tobacco Use  . Smoking status: Never Smoker  . Smokeless tobacco: Never Used  Substance and Sexual Activity  . Alcohol  use: Not Currently    Frequency: Never  . Drug use: Never  . Sexual activity: Not Currently  Lifestyle  . Physical activity:    Days per week: Not on file    Minutes per session: Not on file  . Stress: Not on file  Relationships  . Social connections:    Talks on phone: Not on file    Gets together: Not on file    Attends religious service: Not on file    Active member of club or organization: Not on file    Attends meetings of clubs or organizations: Not on file    Relationship status: Not on file  Other Topics Concern  . Not on file  Social History Narrative  . Not on file   History reviewed. No pertinent family history. Allergies  Allergen Reactions  . Bactrim [Sulfamethoxazole-Trimethoprim] Other (See Comments)    unknown  . Levaquin [Levofloxacin] Other (See Comments)    unknown   Prior to Admission medications   Medication Sig Start Date End Date Taking? Authorizing Provider  acetaminophen (TYLENOL) 650 MG CR tablet Take 650 mg by mouth every 6 (six) hours as needed for pain.   Yes [provider]  Amino Acids-Protein Hydrolys (FEEDING SUPPLEMENT, PRO-STAT SUGAR FREE 64,) LIQD Take 30 mLs by mouth daily.   Yes [provider]  amLODipine (NORVASC) 10 MG tablet Take 1 tablet (10 mg  total) by mouth daily. 03/11/18  Yes Darlin Drop, DO  aspirin EC 81 MG tablet Take 81 mg by mouth daily.   Yes [provider]  brimonidine (ALPHAGAN) 0.15 % ophthalmic solution Place 1 drop into the left eye 3 (three) times daily.   Yes [provider]  docusate sodium (COLACE) 100 MG capsule Take 100 mg by mouth at bedtime.   Yes [provider]  dorzolamide (TRUSOPT) 2 % ophthalmic solution Place 1 drop into the left eye 3 (three) times daily.   Yes [provider]  irbesartan (AVAPRO) 75 MG tablet Take 37.5 mg by mouth at bedtime.    Yes [provider]  pantoprazole (PROTONIX) 40 MG tablet Take 1 tablet (40 mg total) by mouth  2 (two) times daily. 01/24/18  Yes Purohit, Salli Quarry, MD  polyethylene glycol (MIRALAX / GLYCOLAX) packet Take 17 g by mouth daily. 01/08/18  Yes Randel Pigg, Dorma Russell, MD  travoprost, benzalkonium, (TRAVATAN) 0.004 % ophthalmic solution Place 1 drop into the left eye at bedtime. Take 5 mins apart from brimonidine and dorzolamide   Yes [provider]  Artificial Tear Solution (GENTEAL TEARS) 0.1-0.2-0.3 % SOLN Place 1 drop into the left eye 2 (two) times daily as needed (itching).     [provider]  Dextromethorphan-guaiFENesin (ROBITUSSIN COUGH+CHEST CONG DM) 5-100 MG/5ML LIQD Take 15 mLs by mouth every 8 (eight) hours as needed (Cough and Congestion).     [provider]  enoxaparin (LOVENOX) 40 MG/0.4ML injection Inject 0.4 mLs (40 mg total) into the skin daily. Patient not taking: Reported on 04/10/2018 01/28/18   Tarry Kos, MD  HYDROcodone-acetaminophen (NORCO/VICODIN) 5-325 MG tablet Take 1 tablet by mouth every 6 (six) hours as needed for moderate pain.    [provider]  ipratropium-albuterol (DUONEB) 0.5-2.5 (3) MG/3ML SOLN Take 3 mLs by nebulization every 4 (four) hours as needed (cough/wheezing).     [provider]  meclizine (ANTIVERT) 12.5 MG tablet Take 12.5 mg by mouth 3 (three) times daily as needed for dizziness.    [provider]   Dg Chest 1 View  Result Date: 04/10/2018 CLINICAL DATA:  84 year old female with history of unwitnessed fall at 5:40 a.m. this morning. Femoral neck fracture. EXAM: CHEST  1 VIEW COMPARISON:  Chest x-ray 03/07/2018. FINDINGS: Diffuse interstitial prominence and peribronchial cuffing, similar to prior examinations. No confluent consolidative airspace disease. Blunting of left costophrenic sulcus, which may suggest a small left pleural effusion. No right pleural effusion. No pneumothorax. Heart size is upper limits of normal. Upper mediastinal contours are within normal limits. Aortic atherosclerosis.  Multiple old healed right-sided posterolateral rib fractures with mild posttraumatic deformity. IMPRESSION: 1. Probable small left pleural effusion. 2. Chronic interstitial prominence and mild diffuse peribronchial cuffing appear slightly worse than prior examinations. This may suggest an associated acute bronchitis. 3. Aortic atherosclerosis. 4. Multiple old posterolateral right-sided rib fractures with posttraumatic deformity. Electronically Signed   By: Trudie Reed M.D.   On: 04/10/2018 11:23   Dg Pelvis 1-2 Views  Result Date: 04/10/2018 CLINICAL DATA:  Pt arrives via EMS from [facility] with reports of a witnessed fall at 5:40 AM. Pt endorses right hip pain, facility reports Xray showed fx of R femoral neck with impaction without displacement. EXAM: PELVIS - 1-2 VIEW COMPARISON:  02/11/2018. FINDINGS: There is a nondisplaced fracture of the subcapital right femoral neck. There is no significant angulation. No other fracture. Left hip prosthesis appears well seated  and aligned. Right hip joint, SI joints and symphysis pubis are normally aligned. Bones are diffusely demineralized. IMPRESSION: Subcapital fracture of the right femoral neck. Electronically Signed   By: Amie Portlandavid  Ormond M.D.   On: 04/10/2018 11:29   Dg Femur, Min 2 Views Right  Result Date: 04/10/2018 CLINICAL DATA:  [facility] with reports of a witnessed fall at 5:40 AM. Pt endorses right hip pain, facility reports Xray showed fx of R femoral neck with impaction without displacement. EXAM: RIGHT FEMUR 2 VIEWS COMPARISON:  Current radiograph of the pelvis. FINDINGS: Nondisplaced subcapital fracture of the right femoral neck. No other fractures.  Hip and knee joints are normally aligned. Soft tissues are unremarkable. IMPRESSION: Nondisplaced subcapital fracture of the right femoral neck. Electronically Signed   By: Amie Portlandavid  Ormond M.D.   On: 04/10/2018 11:28    All pertinent xrays, MRI, CT  independently reviewed and interpreted  Positive ROS: All other systems have been reviewed and were otherwise negative with the exception of those mentioned in the HPI and as above.  Physical Exam: General: Alert, no acute distress Cardiovascular: No pedal edema Respiratory: No cyanosis, no use of accessory musculature GI: No organomegaly, abdomen is soft and non-tender Skin: No lesions in the area of chief complaint Neurologic: Sensation intact distally Psychiatric: Patient is competent for consent with normal mood and affect Lymphatic: No axillary or cervical lymphadenopathy  MUSCULOSKELETAL:  - pain with movement of the hip and extremity - skin intact - NVI distally - compartments soft  Assessment: Right femoral neck fracture  Plan: - surgical fixation is recommended after discussion of family's wishes for the patient and they are aware of r/b/a and wish to proceed - CT scan ordered to better characterize fracture pattern for surgical planning - consent obtained - medically optimized per primary team - surgery is planned for this afternoon  Thank you for the consult and the opportunity to see Ms. Trieu  N. Glee ArvinMichael Lorenzo Pereyra, MD [facility] [phone] 9:34 AM

## 2018-04-11 NOTE — Anesthesia Procedure Notes (Signed)
Procedure Name: Intubation Date/Time: 04/11/2018 5:38 PM Performed by: Annye Asa, MD Pre-anesthesia Checklist: Patient identified, Emergency Drugs available, Suction available and Patient being monitored Patient Re-evaluated:Patient Re-evaluated prior to induction Oxygen Delivery Method: Circle System Utilized Preoxygenation: Pre-oxygenation with 100% oxygen Induction Type: IV induction Ventilation: Mask ventilation without difficulty Laryngoscope Size: Mac and 3 Grade View: Grade III Tube type: Oral Tube size: 7.0 mm Number of attempts: 2 Airway Equipment and Method: Oral airway and Bougie stylet Placement Confirmation: ETT inserted through vocal cords under direct vision,  positive ETCO2 and breath sounds checked- equal and bilateral Secured at: 21 cm Tube secured with: Tape Dental Injury: Teeth and Oropharynx as per pre-operative assessment  Difficulty Due To: Difficulty was unanticipated, Difficult Airway- due to immobile epiglottis and Difficult Airway- due to anterior larynx Future Recommendations: Recommend- induction with short-acting agent, and alternative techniques readily available

## 2018-04-11 NOTE — Social Work (Signed)
CSW f/u for disposition.  CS[facility] spoke with [name] and they confirmed that patient is from their SNF and can return.  CS[facility]  Keene BreathPatricia Emmaline Wahba, LCSW Clinical Social Worker [phone]

## 2018-04-11 NOTE — Op Note (Signed)
   Date of Surgery: 04/11/2018  INDICATIONS: Ms. Adah SalvageLassiter is a 84 y.o.-year-old female who sustained a right hip fracture. The risks and benefits of the procedure discussed with the family prior to the procedure and all questions were answered; consent was obtained.  PREOPERATIVE DIAGNOSIS: right intertrochantheric fracture   POSTOPERATIVE DIAGNOSIS: Same   PROCEDURE: Treatment of intertrochanteric fracture with intramedullary implant. CPT [phone]   SURGEON: N. Glee ArvinMichael Lavida Patch, M.D.   ASSIST: Starlyn SkeansMary Lindsey [place], [place]; necessary for the timely completion of procedure and due to complexity of procedure.  ANESTHESIA: general   IV FLUIDS AND URINE: See anesthesia record   ESTIMATED BLOOD LOSS: 200 cc  IMPLANTS: [employer] Inter[employer]AN 10 x 36, 90/85  DRAINS: None.   COMPLICATIONS: None.   DESCRIPTION OF PROCEDURE: The patient was brought to the operating room and placed supine on the operating table. The patient's leg had been signed prior to the procedure. The patient had the anesthesia placed by the anesthesiologist. The prep verification and incision time-outs were performed to confirm that this was the correct patient, site, side and location. The patient had an SCD on the opposite lower extremity. The patient did receive antibiotics prior to the incision and was re-dosed during the procedure as needed at indicated intervals. The patient was positioned on the fracture table with the table in traction and internal rotation to reduce the hip. The well leg was placed in a scissor position and all bony prominences were well-padded. The patient had the lower extremity prepped and draped in the standard surgical fashion. The incision was made 4 finger breadths superior to the greater trochanter. A guide pin was inserted into the tip of the greater trochanter under fluoroscopic guidance. An opening reamer was used to gain access to the femoral canal. The nail length was measured and inserted down  the femoral canal to its proper depth. The appropriate version of insertion for the lag screw was found under fluoroscopy. A pin was inserted up the femoral neck through the jig. Then, a second antirotation pin was inserted inferior to the first pin. The length of the lag screw was then measured. The lag screw was inserted as near to center-center in the head as possible. The antirotation pin was then taken out and an interdigitating compression screw was placed in its place. The leg was taken out of traction, then the interdigitating compression screw was used to compress across the fracture. Compression was visualized on serial xrays. The wound was copiously irrigated with saline and the subcutaneous layer closed with 2.0 vicryl and the skin was reapproximated with staples. The wounds were cleaned and dried a final time and a sterile dressing was placed. The hip was taken through a range of motion at the end of the case under fluoroscopic imaging to visualize the approach-withdraw phenomenon and confirm implant length in the head. The patient was then awakened from anesthesia and taken to the recovery room in stable condition. All counts were correct at the end of the case.   POSTOPERATIVE PLAN: The patient will be weight bearing as tolerated and will return in 2 weeks for staple removal and the patient will receive DVT prophylaxis based on other medications, activity level, and risk ratio of bleeding to thrombosis.   Mayra ReelN. Michael Dauna Ziska, MD [facility] [phone] 6:29 PM

## 2018-04-11 NOTE — Consult Note (Addendum)
[facility]:  Pt [facility] an [name] The family has opted to have minimal surgery to help with the pt's pain and fix hip. The plan will be to return to the facility so that they can keep her in her normal surroundings and normal routine. The family does not wish to pursue any type of rehab. We will continue to follow pt and support family through this time with plan to resume the services at discharge. Please call us with any concerns and thank you for helping keep out pt comfortable. She does have a MOST form and a DNR in place with us and at the facility to help facilitate her goals. Plan of care placed on chart to be scanned with these wishes in place. Norm ParcelCheri Kennedy RN [phone]

## 2018-04-11 NOTE — Discharge Instructions (Signed)
° ° °  1. Change dressings as needed °2. May shower but keep incisions covered and dry °3. Take lovenox to prevent blood clots °4. Take stool softeners as needed °5. Take pain meds as needed ° °

## 2018-04-11 NOTE — Plan of Care (Signed)
  Problem: Education: Goal: Knowledge of General Education information will improve Outcome: Progressing   Problem: Activity: Goal: Risk for activity intolerance will decrease Outcome: Progressing   Problem: Nutrition: Goal: Adequate nutrition will be maintained Outcome: Progressing   Problem: Pain Managment: Goal: General experience of comfort will improve Outcome: Progressing   

## 2018-04-11 NOTE — Transfer of Care (Signed)
Immediate Anesthesia Transfer of Care Note  Patient: Donna Summers  Procedure(s) Performed: INTRAMEDULLARY (IM) NAIL INTERTROCHANTRIC (Right )  Patient Location: PACU  Anesthesia Type:General  Level of Consciousness: awake  Airway & Oxygen Therapy: Patient Spontanous Breathing  Post-op Assessment: Report given to RN  Post vital signs: Reviewed and stable  Last Vitals:  Vitals Value Taken Time  BP    Temp    Pulse 102 04/11/2018  6:53 PM  Resp 23 04/11/2018  6:53 PM  SpO2 93 % 04/11/2018  6:53 PM  Vitals shown include unvalidated device data.  Last Pain:  Vitals:   04/11/18 1552  TempSrc: Oral  PainSc:          Complications: No apparent anesthesia complications

## 2018-04-12 ENCOUNTER — Encounter (HOSPITAL_COMMUNITY): Payer: Self-pay | Admitting: Orthopaedic Surgery

## 2018-04-12 LAB — BASIC METABOLIC PANEL
Anion gap: 8 (ref 5–15)
BUN: 24 mg/dL — AB (ref 8–23)
CALCIUM: 8.3 mg/dL — AB (ref 8.9–10.3)
CO2: 24 mmol/L (ref 22–32)
CREATININE: 1.5 mg/dL — AB (ref 0.44–1.00)
Chloride: 106 mmol/L (ref 98–111)
GFR calc Af Amer: 33 mL/min — ABNORMAL LOW (ref >60)
GFR, EST NON AFRICAN AMERICAN: 28 mL/min — AB (ref >60)
GLUCOSE: 109 mg/dL — AB (ref 70–99)
Potassium: 4.3 mmol/L (ref 3.5–5.1)
SODIUM: 138 mmol/L (ref 135–145)

## 2018-04-12 LAB — VITAMIN D 25 HYDROXY (VIT D DEFICIENCY, FRACTURES): Vit D, 25-Hydroxy: 49.5 ng/mL (ref 30.0–100.0)

## 2018-04-12 MED ORDER — FERROUS SULFATE 325 (65 FE) MG PO TABS
325.0000 mg | ORAL_TABLET | Freq: Two times a day (BID) | ORAL | Status: DC
  Administered 2018-04-12 – 2018-04-13 (×2): 325 mg via ORAL
  Filled 2018-04-12 (×2): qty 1

## 2018-04-12 NOTE — Progress Notes (Signed)
Physical Therapy Evaluation and Discharge Patient Details Name: Donna Summers MRN: [id number] DOB: [birth year] Today's Date: 04/12/2018   History of Present Illness  Pt is a 84 y/o female s/p R IM nail placement following fall which resulted in R femoral neck fracture. Pt currently followed by hospice at SNF. PMH includes HTN, glaucoma, CVA, and recent L hip fracture s/p L hemiarthroplasty.   Clinical Impression  Pt is s/p surgery above with deficits below. Pt limited secondary to increased pain and only able to stand at EOB. Pt required max A +2 for mobility tasks. Pt also presenting with cognitive deficits, which pt's son reports as baseline. Pt also reported difficulty swallowing; spoke with RN about possible speech eval if pt and family wants to pursue. Pt's son reports family does not want to continue acute PT services, as the plan is to return to SNF and continue hospice services there. All further needs can be met at [facility]. Will sign off. If needs change, please re-consult.     Follow Up Recommendations SNF;Supervision/Assistance - 24 hour    Equipment Recommendations  None recommended by PT    Recommendations for Other Services Speech consult     Precautions / Restrictions Precautions Precautions: Fall Restrictions Weight Bearing Restrictions: Yes RLE Weight Bearing: Weight bearing as tolerated      Mobility  Bed Mobility Overal bed mobility: Needs Assistance Bed Mobility: Supine to Sit;Sit to Supine     Supine to sit: Max assist;+2 for physical assistance Sit to supine: Max assist;+2 for physical assistance   General bed mobility comments: Max A +2 for bed mobility tasks. Increased time required secondary to pain. Required assist for trunk control and LE assist.   Transfers Overall transfer level: Needs assistance Equipment used: 2 person hand held assist;Rolling walker (2 wheeled) Transfers: Sit to/from Stand Sit to Stand: Max assist;+2 physical assistance          General transfer comment: Max A +2 for lift assist and steadying to stand. Attempted with RW first and pt with heavy posterior lean. Attempted second time with 2 person HHA and pt able to come to upright, however, unable to tolerate prolonged standing. Further mobility limited secondary to pain.   Ambulation/Gait             General Gait Details: Deferred   Stairs            Wheelchair Mobility    Modified Rankin (Stroke Patients Only)       Balance Overall balance assessment: Needs assistance Sitting-balance support: Bilateral upper extremity supported;Feet supported Sitting balance-Leahy Scale: Poor Sitting balance - Comments: Required min to min guard in sitting. Braced herself with use of BUE in sitting.    Standing balance support: Bilateral upper extremity supported Standing balance-Leahy Scale: Poor Standing balance comment: Reliant on UE support and external assist.                              Pertinent Vitals/Pain Pain Assessment: Faces Faces Pain Scale: Hurts whole lot Pain Location: R hip  Pain Descriptors / Indicators: Grimacing;Guarding;Operative site guarding Pain Intervention(s): Limited activity within patient's tolerance;Monitored during session;Repositioned    Home Living Family/patient expects to be discharged to:: Skilled nursing facility                 Additional Comments: From [facility] with Hospice     Prior Function Level of Independence: Needs assistance   Gait / Transfers  Assistance Needed: Per son, pt was using WC and staff were assisting with transfers, however, pt would get up and walk without assist and would fall.   ADL's / Homemaking Assistance Needed: From SNF so assume assist required for ADL tasks.         Hand Dominance        Extremity/Trunk Assessment   Upper Extremity Assessment Upper Extremity Assessment: Generalized weakness    Lower Extremity Assessment Lower Extremity Assessment: RLE  deficits/detail RLE Deficits / Details: Increased pain which limited ROM this session. Deficits consistent with post op pain and weakness.     Cervical / Trunk Assessment Cervical / Trunk Assessment: Kyphotic;Other exceptions Cervical / Trunk Exceptions: posterior lean in standing.   Communication   Communication: No difficulties  Cognition Arousal/Alertness: Awake/alert Behavior During Therapy: WFL for tasks assessed/performed Overall Cognitive Status: History of cognitive impairments - at baseline                                 General Comments: Oriented to person only. Pt talking on callbell like it was a phone. Per son, pt with dementia at baseline.       General Comments General comments (skin integrity, edema, etc.): Spoke with son over phone at end of session and discussed POC. Pt's son requesting PT to sign off after eval, as pt is being followed by hospice and family is not interested in pursuing PT services.     Exercises     Assessment/Plan    PT Assessment All further PT needs can be met in the next venue of care  PT Problem List Decreased strength;Decreased activity tolerance;Decreased range of motion;Decreased balance;Decreased mobility;Decreased knowledge of precautions;Pain;Decreased safety awareness;Decreased cognition;Decreased knowledge of use of DME       PT Treatment Interventions      PT Goals (Current goals can be found in the Care Plan section)  Acute Rehab PT Goals Patient Stated Goal: to go back to SNF with hospice per son  PT Goal Formulation: With family Time For Goal Achievement: 04/12/18 Potential to Achieve Goals: Fair    Frequency     Barriers to discharge        Co-evaluation               AM-PAC PT "6 Clicks" Daily Activity  Outcome Measure Difficulty turning over in bed (including adjusting bedclothes, sheets and blankets)?: Unable Difficulty moving from lying on back to sitting on the side of the bed? :  Unable Difficulty sitting down on and standing up from a chair with arms (e.g., wheelchair, bedside commode, etc,.)?: Unable Help needed moving to and from a bed to chair (including a wheelchair)?: Total Help needed walking in hospital room?: Total Help needed climbing 3-5 steps with a railing? : Total 6 Click Score: 6    End of Session Equipment Utilized During Treatment: Gait belt Activity Tolerance: Patient limited by pain Patient left: in bed;with call bell/phone within reach;with bed alarm set Nurse Communication: Mobility status;Other (comment)(pt reports difficulty swallowing; incontinent episode) PT Visit Diagnosis: Unsteadiness on feet (R26.81);History of falling (Z91.81);Muscle weakness (generalized) (M62.81);Repeated falls (R29.6);Pain Pain - Right/Left: Right Pain - part of body: Hip    Time: 1122-1159 PT Time Calculation (min) (ACUTE ONLY): 37 min   Charges:   PT Evaluation $PT Eval Moderate Complexity: 1 Mod PT Treatments $Therapeutic Activity: 8-22 mins   PT G Codes:  Leighton Ruff, PT, DPT  Acute Rehabilitation Services  Pager: [phone]   Rudean Hitt 04/12/2018, 12:42 PM

## 2018-04-12 NOTE — Progress Notes (Addendum)
PROGRESS NOTE                                                                                                                                                                                                             Patient Demographics:    Donna Summers, is a 84 y.o. female, DOB - 11-Jan-1920, ZOX:[id number]  Admit date - 04/10/2018   Admitting Physician Pearson Grippe, MD  Outpatient Primary MD for the patient is [name], MD  LOS - 2   Chief Complaint  Patient presents with  . Fall  . Hip Pain       Brief Narrative   84 y.o. female , w hypertension, ckd stage3,  Genella Rife, prior L hip fracture that is post surgical repair in April by Dr. Roda Shutters, who presents with right femoral neck fracture s/p mechanical fall PTA.  Recently had multiple falls, she recently had left hip fracture status post surgical repair, patient is hospice, and went for right hip fracture surgical repair for pain control   Subjective:    Donna Summers today denies any nausea or vomiting, reports some right hip pain   Assessment  & Plan :    Principal Problem:   Femoral neck fracture (HCC) Active Problems:   GERD (gastroesophageal reflux disease)   HTN (hypertension)   CKD (chronic kidney disease), stage III (HCC)   Anemia    R femoral neck fracture  -Continue to mechanical fall, patient under hospice care at facility, she is with multiple falls, and recent left hip fracture status post surgical repair . -Is with family, and orthopedic, even though patient is hospice, decision has been made to proceed with surgical repair given significant amount of pain she had before surgery, appears to be much improved today and pain significantly controlled . -Further management per Alice Rieger.  Hypertension -Blood pressure on the lower side today, so I will discontinue home medication including amlodipine and irbesartan   CKD stage 3 -Renal function stable at baseline, continue to  monitor   CVA (old lacunar infarcts on CT ) -Continue with aspirin  Protein calorie malnutrition - Cont Prostat 30mL po bid  Acute blood loss anemia -Globin 8.2 today, will start on iron supplement  Anemia -Hemoglobin at baseline at baseline  Glaucoma Cont Alphagan Cont Travatan Cont Trusopt  Gerd Cont protonix 40mg  po qday  Goals of care -I have discussed with son and daughter-in-law at bedside, this is the patient's fifth hospitalization in 34-month, she is under hospice care, patient has significant amount of pain and discomfort secondary to hip fracture, and family agreeable for surgery as a part to minimize the amount of her pain .    Code Status : DNR  Family Communication  : son  at bedside  Disposition Plan  : back to SNF in a.m.  Consults  :  ortho  Procedures  : none  DVT Prophylaxis  :  lovenox  Lab Results  Component Value Date   PLT 207 04/12/2018    Antibiotics  :    Anti-infectives (From admission, onward)   Start     Dose/Rate Route Frequency Ordered Stop   04/12/18 0600  ceFAZolin (ANCEF) IVPB 2g/100 mL premix     2 g 200 mL/hr over 30 Minutes Intravenous Every 12 hours 04/11/18 1956 04/13/18 2159   04/11/18 1730  ceFAZolin (ANCEF) IVPB 2g/100 mL premix     2 g 200 mL/hr over 30 Minutes Intravenous To Short Stay 04/11/18 1710 04/11/18 1740   04/11/18 1717  ceFAZolin (ANCEF) 2-4 GM/100ML-% IVPB    Note to Pharmacy:  Block, Sarah   : cabinet override      04/11/18 1717 04/11/18 1740        Objective:   Vitals:   04/11/18 2017 04/12/18 0425 04/12/18 1016 04/12/18 1355  BP: (!) 142/53 (!) 135/43 112/83 (!) 101/44  Pulse: 91 88 81 89  Resp: 15 16 16 16   Temp: 97.8 F (36.6 C) 98.3 F (36.8 C) 98.1 F (36.7 C) 98.3 F (36.8 C)  TempSrc: Oral Oral Oral Oral  SpO2: 90% 95% 97%   Weight:      Height:        Wt Readings from Last 3 Encounters:  04/11/18 65.5 kg (144 lb 6.4 oz)  03/10/18 66.2 kg (146 lb)  02/11/18 71.7 kg  (158 lb)     Intake/Output Summary (Last 24 hours) at 04/12/2018 1414 Last data filed at 04/12/2018 0830 Gross per 24 hour  Intake 320 ml  Output 175 ml  Net 145 ml     Physical Exam  Patient laying comfortably in the bed, confused, alert x1, pleasant  Good air entry bilaterally, clear to auscultation RRR,No Gallops,Rubs or new Murmurs, No Parasternal Heave +ve B.Sounds, Abd Soft, No tenderness, No rebound - guarding or rigidity. No clubbing, cyanosis or edema in lower extremities   Data Review:    CBC Recent Labs  Lab 04/10/18 0957 04/11/18 0413 04/12/18 0639  WBC 13.2* 9.7 11.9*  HGB 10.8* 10.5* 8.2*  HCT 35.9* 35.3* 27.4*  PLT 296 250 207  MCV 82.5 83.5 83.8  MCH 24.8* 24.8* 25.1*  MCHC 30.1 29.7* 29.9*  RDW 15.2 15.3 15.2  LYMPHSABS 2.7  --   --   MONOABS 1.1*  --   --   EOSABS 0.1  --   --   BASOSABS 0.0  --   --     Chemistries  Recent Labs  Lab 04/10/18 0957 04/11/18 0413 04/12/18 0639  NA 140 141 138  K 3.9 4.4 4.3  CL 108 105 106  CO2 23 27 24   GLUCOSE 114* 97 109*  BUN 22* 21* 24*  CREATININE 1.40* 1.28* 1.50*  CALCIUM 9.1 9.0 8.3*  AST  --  18  --   ALT  --  10*  --   ALKPHOS  --  94  --   BILITOT  --  0.7  --    ------------------------------------------------------------------------------------------------------------------ Recent Labs    04/11/18 0413  CHOL 202*  HDL 34*  LDLCALC 128*  TRIG 199*  CHOLHDL 5.9    No results found for: HGBA1C ------------------------------------------------------------------------------------------------------------------ Recent Labs    04/10/18 1329  TSH 2.240   ------------------------------------------------------------------------------------------------------------------ No results for input(s): VITAMINB12, FOLATE, FERRITIN, TIBC, IRON, RETICCTPCT in the last 72 hours.  Coagulation profile Recent Labs  Lab 04/10/18 0957  INR 1.14    No results for input(s): DDIMER in the last 72  hours.  Cardiac Enzymes No results for input(s): CKMB, TROPONINI, MYOGLOBIN in the last 168 hours.  Invalid input(s): CK ------------------------------------------------------------------------------------------------------------------ No results found for: BNP  Inpatient Medications  Scheduled Meds: . amLODipine  10 mg Oral Daily  . aspirin EC  81 mg Oral Daily  . brimonidine  1 drop Left Eye TID  . docusate sodium  100 mg Oral BID  . dorzolamide  1 drop Left Eye TID  . enoxaparin (LOVENOX) injection  30 mg Subcutaneous Q24H  . feeding supplement (PRO-STAT SUGAR FREE 64)  30 mL Oral Daily  . irbesartan  37.5 mg Oral QHS  . latanoprost  1 drop Left Eye QHS  . pantoprazole  40 mg Oral BID  . polyethylene glycol  17 g Oral Daily  . sodium chloride flush  3 mL Intravenous Q12H   Continuous Infusions: . sodium chloride    . sodium chloride 75 mL/hr at 04/12/18 0715  .  ceFAZolin (ANCEF) IV 2 g (04/12/18 0505)  . lactated ringers 10 mL/hr at 04/11/18 1657   PRN Meds:.sodium chloride, acetaminophen, alum & mag hydroxide-simeth, fentaNYL (SUBLIMAZE) injection, HYDROcodone-acetaminophen, HYDROcodone-acetaminophen, ipratropium-albuterol, meclizine, menthol-cetylpyridinium **OR** phenol, metoCLOPramide **OR** metoCLOPramide (REGLAN) injection, morphine injection, ondansetron **OR** ondansetron (ZOFRAN) IV, polyvinyl alcohol, sodium chloride flush  Micro Results Recent Results (from the past 240 hour(s))  MRSA PCR Screening     Status: None   Collection Time: 04/10/18  1:38 PM  Result Value Ref Range Status   MRSA by PCR NEGATIVE NEGATIVE Final    Comment:        The GeneXpert MRSA Assay (FDA approved for NASAL specimens only), is one component of a comprehensive MRSA colonization surveillance program. It is not intended to diagnose MRSA infection nor to guide or monitor treatment for MRSA infections. Performed at [facility], [address] [place], [place] [address]       Radiology Reports Dg Chest 1 View  Result Date: 04/10/2018 CLINICAL DATA:  84 year old female with history of unwitnessed fall at 5:40 a.m. this morning. Femoral neck fracture. EXAM: CHEST  1 VIEW COMPARISON:  Chest x-ray 03/07/2018. FINDINGS: Diffuse interstitial prominence and peribronchial cuffing, similar to prior examinations. No confluent consolidative airspace disease. Blunting of left costophrenic sulcus, which may suggest a small left pleural effusion. No right pleural effusion. No pneumothorax. Heart size is upper limits of normal. Upper mediastinal contours are within normal limits. Aortic atherosclerosis. Multiple old healed right-sided posterolateral rib fractures with mild posttraumatic deformity. IMPRESSION: 1. Probable small left pleural effusion. 2. Chronic interstitial prominence and mild diffuse peribronchial cuffing appear slightly worse than prior examinations. This may suggest an associated acute bronchitis. 3. Aortic atherosclerosis. 4. Multiple old posterolateral right-sided rib fractures with posttraumatic deformity. Electronically Signed   By: Trudie Reedaniel  Entrikin M.D.   On: 04/10/2018 11:23   Dg Pelvis 1-2 Views  Result Date: 04/10/2018 CLINICAL DATA:  Pt arrives via EMS from [facility] with reports of a witnessed fall at 5:40 AM. Pt endorses right hip pain, facility reports Xray showed fx of R femoral neck with impaction without displacement. EXAM: PELVIS - 1-2 VIEW COMPARISON:  02/11/2018. FINDINGS: There is a nondisplaced fracture of the subcapital right femoral neck. There is no significant angulation. No other fracture. Left hip prosthesis appears well seated and aligned. Right hip joint, SI joints and symphysis pubis are normally aligned. Bones are diffusely demineralized. IMPRESSION: Subcapital fracture of the right femoral neck. Electronically Signed   By: Amie Portland M.D.   On: 04/10/2018 11:29   Ct Hip Right Wo Contrast  Result Date:  04/11/2018 CLINICAL DATA:  Right hip pain status post fall EXAM: CT OF THE RIGHT HIP WITHOUT CONTRAST TECHNIQUE: Multidetector CT imaging of the right hip was performed according to the standard protocol. Multiplanar CT image reconstructions were also generated. COMPARISON:  None. FINDINGS: Bones/Joint/Cartilage Generalized osteopenia. Acute impacted right subcapital femoral fracture. Acute comminuted intertrochanteric fracture. No other acute fracture or dislocation. No dislocation. Mild osteoarthritis of the right hip. Ligaments Suboptimally assessed by CT. Muscles and Tendons Muscles are normal.  No intramuscular fluid collection. Soft tissues No fluid collection or hematoma. Peripheral vascular atherosclerotic disease. IMPRESSION: 1. Acute impacted right subcapital femoral fracture. Acute comminuted right intertrochanteric fracture. Electronically Signed   By: Elige Ko   On: 04/11/2018 12:20   Dg C-arm 1-60 Min  Result Date: 04/11/2018 CLINICAL DATA:  84 year old female with right femoral neck/intertrochanteric fracture. Subsequent encounter. EXAM: DG C-ARM 61-120 MIN; RIGHT FEMUR 2 VIEWS 1 minutes and 32 seconds. COMPARISON:  04/11/2018 CT.  04/10/2018 plain film exam. FINDINGS: Four C arm views submitted for review after surgery. Right long stem intramedullary femoral rod with proximal sliding type screws placed for fixation of right femoral neck/intertrochanteric fracture. IMPRESSION: Open reduction and internal fixation of right femoral neck/intertrochanteric fracture. Electronically Signed   By: Lacy Duverney M.D.   On: 04/11/2018 18:50   Dg Femur, Min 2 Views Right  Result Date: 04/11/2018 CLINICAL DATA:  84 year old female with right femoral neck/intertrochanteric fracture. Subsequent encounter. EXAM: DG C-ARM 61-120 MIN; RIGHT FEMUR 2 VIEWS 1 minutes and 32 seconds. COMPARISON:  04/11/2018 CT.  04/10/2018 plain film exam. FINDINGS: Four C arm views submitted for review after surgery. Right  long stem intramedullary femoral rod with proximal sliding type screws placed for fixation of right femoral neck/intertrochanteric fracture. IMPRESSION: Open reduction and internal fixation of right femoral neck/intertrochanteric fracture. Electronically Signed   By: Lacy Duverney M.D.   On: 04/11/2018 18:50   Dg Femur, Min 2 Views Right  Result Date: 04/10/2018 CLINICAL DATA:  Pt arrives via EMS from [facility] with reports of a witnessed fall at 5:40 AM. Pt endorses right hip pain, facility reports Xray showed fx of R femoral neck with impaction without displacement. EXAM: RIGHT FEMUR 2 VIEWS COMPARISON:  Current radiograph of the pelvis. FINDINGS: Nondisplaced subcapital fracture of the right femoral neck. No other fractures.  Hip and knee joints are normally aligned. Soft tissues are unremarkable. IMPRESSION: Nondisplaced subcapital fracture of the right femoral neck. Electronically Signed   By: Amie Portland M.D.   On: 04/10/2018 11:28       Huey Bienenstock M.D on 04/12/2018 at 2:14 PM  Between 7am to 7pm - Pager - [phone]  After 7pm go to [email or web address] - password [facility]  [facility] -  Office  [phone]

## 2018-04-12 NOTE — Progress Notes (Signed)
[facility]:  Met with pt and she is forgetful. Pain is better than yesterday but sore feeling. She is active with our program at the [facility]. Family hopeful to continue for care there not seeking any type of aggressive rehab for the pt but long term care which she was receiving prior to coming into the hospital. We will continue to follow while in hospital and resume care back at facility upon discharge. Webb Silversmith RN [phone]

## 2018-04-12 NOTE — Progress Notes (Signed)
Subjective: 1 Day Post-Op Procedure(s) (LRB): INTRAMEDULLARY (IM) NAIL INTERTROCHANTRIC (Right) Patient reports pain as mild.  Slightly confused this am.  Objective: Vital signs in last 24 hours: Temp:  [97.1 F (36.2 C)-98.3 F (36.8 C)] 98.3 F (36.8 C) (06/25 0425) Pulse Rate:  [79-111] 88 (06/25 0425) Resp:  [15-23] 16 (06/25 0425) BP: (134-170)/(43-74) 135/43 (06/25 0425) SpO2:  [90 %-96 %] 95 % (06/25 0425)  Intake/Output from previous day: 06/24 0701 - 06/25 0700 In: 200 [P.O.:200] Out: 175 [Urine:100; Blood:75] Intake/Output this shift: No intake/output data recorded.  Recent Labs    04/10/18 0957 04/11/18 0413 04/12/18 0639  HGB 10.8* 10.5* 8.2*   Recent Labs    04/11/18 0413 04/12/18 0639  WBC 9.7 11.9*  RBC 4.23 3.27*  HCT 35.3* 27.4*  PLT 250 207   Recent Labs    04/11/18 0413 04/12/18 0639  NA 141 138  K 4.4 4.3  CL 105 106  CO2 27 24  BUN 21* 24*  CREATININE 1.28* 1.50*  GLUCOSE 97 109*  CALCIUM 9.0 8.3*   Recent Labs    04/10/18 0957  INR 1.14    Neurologically intact Neurovascular intact Sensation intact distally Intact pulses distally Dorsiflexion/Plantar flexion intact Incision: dressing C/D/I No cellulitis present Compartment soft    Assessment/Plan: 1 Day Post-Op Procedure(s) (LRB): INTRAMEDULLARY (IM) NAIL INTERTROCHANTRIC (Right) Up with therapy  WBAT RLE ABLA-mild and stable    Donna Summers L Ahmeer Tuman 04/12/2018, 7:54 AM

## 2018-04-12 NOTE — Clinical Social Work Note (Signed)
Clinical Social Work Assessment  Patient Details  Name: Donna Summers MRN: [id number] Date of Birth: 1920/10/10  Date of referral:  04/12/18               Reason for consult:  Facility Placement                Permission sought to share information with:  Facility Art therapist granted to share information::  Yes, Verbal Permission Granted  Name::        Agency::  [name]  Relationship::     Contact Information:     Housing/Transportation Living arrangements for the past 2 months:  [place] of Information:  Adult Children Patient Interpreter Needed:  None Criminal Activity/Legal Involvement Pertinent to Current Situation/Hospitalization:  No - Comment as needed Significant Relationships:  Adult Children, Other Family Members Lives with:  Facility Resident Do you feel safe going back to the place where you live?  No Need for family participation in patient care:  Yes (Comment)  Care giving concerns:  Pt from [facility] with new impairment and will need to return for rehab. [facility] will continue to follow patient at SNF.  Marland Kitchen  Social Worker assessment / plan:  CSW will assist with return to SNF. CSW met with son at bedside to discuss plan to return to SNF, family in agreement with return to [facility]. CSW will assist with disposition.   Employment status:  Retired Forensic scientist:  Medicare PT Recommendations:  [place] / Referral to community resources:  [place]  Patient/Family's Response to care:  Family appreciative of CSW assistance.  Patient/Family's Understanding of and Emotional Response to Diagnosis, Current Treatment, and Prognosis:  Family has good understanding of patient's impairment and agree with return back to SNF.  Sons are involved in mom's care and desire her to return to nursing facility and continue with hospice following. No other issues or  concerns identified.  Emotional Assessment Appearance:  Appears stated age Attitude/Demeanor/Rapport:    Affect (typically observed):  Quiet Orientation:  Oriented to Self Alcohol / Substance use:  Not Applicable Psych involvement (Current and /or in the community):  No (Comment)  Discharge Needs  Concerns to be addressed:  Discharge Planning Concerns Readmission within the last 30 days:  Yes Current discharge risk:  Physical Impairment Barriers to Discharge:  No Barriers Identified   Normajean Baxter, LCSW 04/12/2018, 11:32 AM

## 2018-04-12 NOTE — NC FL2 (Signed)
[place] MEDICAID FL2 LEVEL OF CARE SCREENING TOOL     IDENTIFICATION  Patient Name: Donna Summers Birthdate: 1919/12/23 Sex: female Admission Date (Current Location): 04/10/2018  [facility] [place]   Facility and Address:  [facility], [address], [place], [place] [address]      Provider Number: [id number]  Attending Physician Name and Address:  Starleen ArmsElgergawy, Dawood S, MD  Relative Name and Phone Number:  son, Janyth Pupaicholas, [phone]    Current Level of Care: Hospital Recommended Level of Care: Skilled Nursing Facility Prior Approval Number:    Date Approved/Denied:   PASRR Number: [id number]  Discharge Plan: SNF    Current Diagnoses: Patient Active Problem List   Diagnosis Date Noted  . Femoral neck fracture (HCC) 04/10/2018  . Anemia 04/10/2018  . UTI (urinary tract infection) 03/08/2018  . Palliative care by specialist   . Goals of care, counseling/discussion   . Shortness of breath   . Generalized pain   . Glaucoma 03/07/2018  . Fall 03/07/2018  . History of left hip hemiarthroplasty 02/11/2018  . Pressure injury of skin 02/01/2018  . Nausea and vomiting in adult   . Closed left hip fracture, initial encounter (HCC) 01/28/2018  . CKD (chronic kidney disease), stage III (HCC) 01/28/2018  . Hypertensive urgency   . Dysphagia 01/20/2018  . Acute lower UTI 01/20/2018  . Dehydration 01/20/2018  . Weakness generalized 01/20/2018  . Symptomatic cholelithiasis 01/19/2018  . Cholelithiasis 01/19/2018  . Cholecystitis 01/06/2018  . Constipation 01/06/2018  . History of urinary retention 01/06/2018  . Abdominal pain 01/06/2018  . Vitamin D deficiency 01/06/2018  . HLD (hyperlipidemia) 01/06/2018  . GERD (gastroesophageal reflux disease) 01/06/2018  . HTN (hypertension) 01/06/2018    Orientation RESPIRATION BLADDER Height & Weight     Self  O2(Nasal Cannula 2L) Incontinent Weight: 144 lb 6.4 oz (65.5  kg) Height:  5\' 2"  (157.5 cm)  BEHAVIORAL SYMPTOMS/MOOD NEUROLOGICAL BOWEL NUTRITION STATUS      Continent Diet(See DC Summary)  AMBULATORY STATUS COMMUNICATION OF NEEDS Skin   Extensive Assist Verbally Surgical wounds                       Personal Care Assistance Level of Assistance  Bathing, Dressing, Feeding Bathing Assistance: Maximum assistance Feeding assistance: Maximum assistance Dressing Assistance: Maximum assistance     Functional Limitations Info  Sight, Hearing, Speech Sight Info: Adequate Hearing Info: Adequate Speech Info: Adequate    SPECIAL CARE FACTORS FREQUENCY  PT (By licensed PT), OT (By licensed OT)     PT Frequency: weekly OT Frequency: weekly            Contractures Contractures Info: Not present    Additional Factors Info  Code Status, Allergies Code Status Info: DNR Allergies Info: BACTRIM SULFAMETHOXAZOLE-TRIMETHOPRIM, LEVAQUIN LEVOFLOXACIN            Current Medications (04/12/2018):  This is the current hospital active medication list Current Facility-Administered Medications  Medication Dose Route Frequency Provider Last Rate Last Dose  . 0.9 %  sodium chloride infusion  250 mL Intravenous PRN Tarry KosXu, Naiping M, MD      . 0.9 %  sodium chloride infusion   Intravenous Continuous Tarry KosXu, Naiping M, MD 75 mL/hr at 04/12/18 0715    . acetaminophen (TYLENOL) tablet 325-650 mg  325-650 mg Oral Q6H PRN Tarry KosXu, Naiping M, MD      . alum & mag hydroxide-simeth (MAALOX/MYLANTA) 200-200-20 MG/5ML suspension  30 mL  30 mL Oral Q4H PRN Tarry Kos, MD      . amLODipine (NORVASC) tablet 10 mg  10 mg Oral Daily Tarry Kos, MD   10 mg at 04/12/18 0943  . aspirin EC tablet 81 mg  81 mg Oral Daily Tarry Kos, MD   81 mg at 04/12/18 6045  . brimonidine (ALPHAGAN) 0.15 % ophthalmic solution 1 drop  1 drop Left Eye TID Tarry Kos, MD   1 drop at 04/12/18 0943  . ceFAZolin (ANCEF) IVPB 2g/100 mL premix  2 g Intravenous Q12H Tarry Kos, MD 200 mL/hr  at 04/12/18 0505 2 g at 04/12/18 0505  . docusate sodium (COLACE) capsule 100 mg  100 mg Oral BID Tarry Kos, MD   100 mg at 04/12/18 0943  . dorzolamide (TRUSOPT) 2 % ophthalmic solution 1 drop  1 drop Left Eye TID Tarry Kos, MD   1 drop at 04/12/18 0943  . enoxaparin (LOVENOX) injection 30 mg  30 mg Subcutaneous Q24H Tarry Kos, MD   30 mg at 04/12/18 0942  . feeding supplement (PRO-STAT SUGAR FREE 64) liquid 30 mL  30 mL Oral Daily Tarry Kos, MD   30 mL at 04/12/18 0946  . fentaNYL (SUBLIMAZE) injection 50 mcg  50 mcg Intravenous Q30 min PRN Tarry Kos, MD   50 mcg at 04/10/18 1305  . HYDROcodone-acetaminophen (NORCO) 7.5-325 MG per tablet 1-2 tablet  1-2 tablet Oral Q4H PRN Tarry Kos, MD   2 tablet at 04/11/18 1909  . HYDROcodone-acetaminophen (NORCO/VICODIN) 5-325 MG per tablet 1-2 tablet  1-2 tablet Oral Q4H PRN Tarry Kos, MD      . ipratropium-albuterol (DUONEB) 0.5-2.5 (3) MG/3ML nebulizer solution 3 mL  3 mL Nebulization Q4H PRN Tarry Kos, MD      . irbesartan (AVAPRO) tablet 37.5 mg  37.5 mg Oral QHS Tarry Kos, MD   37.5 mg at 04/10/18 2240  . lactated ringers infusion   Intravenous Continuous Tarry Kos, MD 10 mL/hr at 04/11/18 1657    . latanoprost (XALATAN) 0.005 % ophthalmic solution 1 drop  1 drop Left Eye QHS Tarry Kos, MD   1 drop at 04/11/18 2115  . meclizine (ANTIVERT) tablet 12.5 mg  12.5 mg Oral TID PRN Tarry Kos, MD      . menthol-cetylpyridinium (CEPACOL) lozenge 3 mg  1 lozenge Oral PRN Tarry Kos, MD       Or  . phenol (CHLORASEPTIC) mouth spray 1 spray  1 spray Mouth/Throat PRN Tarry Kos, MD      . metoCLOPramide (REGLAN) tablet 5-10 mg  5-10 mg Oral Q8H PRN Tarry Kos, MD       Or  . metoCLOPramide (REGLAN) injection 5-10 mg  5-10 mg Intravenous Q8H PRN Tarry Kos, MD      . morphine 2 MG/ML injection 0.5-1 mg  0.5-1 mg Intravenous Q2H PRN Tarry Kos, MD      . ondansetron [facility]) tablet 4 mg  4 mg Oral Q6H  PRN Tarry Kos, MD       Or  . ondansetron [facility]) injection 4 mg  4 mg Intravenous Q6H PRN Tarry Kos, MD      . pantoprazole (PROTONIX) EC tablet 40 mg  40 mg Oral BID Tarry Kos, MD   40 mg at 04/12/18 0943  . polyethylene glycol (MIRALAX / GLYCOLAX) packet 17  g  17 g Oral Daily Tarry Kos, MD   17 g at 04/12/18 1610  . polyvinyl alcohol (LIQUIFILM TEARS) 1.4 % ophthalmic solution 1 drop  1 drop Left Eye BID PRN Tarry Kos, MD      . sodium chloride flush (NS) 0.9 % injection 3 mL  3 mL Intravenous Q12H Tarry Kos, MD   3 mL at 04/11/18 2116  . sodium chloride flush (NS) 0.9 % injection 3 mL  3 mL Intravenous PRN Tarry Kos, MD         Discharge Medications: Please see discharge summary for a list of discharge medications.  Relevant Imaging Results:  Relevant Lab Results:   Additional Information SS: [id number]  Tresa Moore, LCSW

## 2018-04-12 NOTE — Plan of Care (Signed)

## 2018-04-13 DIAGNOSIS — W19XXXD Unspecified fall, subsequent encounter: Secondary | ICD-10-CM

## 2018-04-13 LAB — BASIC METABOLIC PANEL
Anion gap: 7 (ref 5–15)
BUN: 23 mg/dL (ref 8–23)
CO2: 25 mmol/L (ref 22–32)
CREATININE: 1.35 mg/dL — AB (ref 0.44–1.00)
Calcium: 8.3 mg/dL — ABNORMAL LOW (ref 8.9–10.3)
Chloride: 106 mmol/L (ref 98–111)
GFR calc Af Amer: 37 mL/min — ABNORMAL LOW (ref >60)
GFR, EST NON AFRICAN AMERICAN: 32 mL/min — AB (ref >60)
Glucose, Bld: 104 mg/dL — ABNORMAL HIGH (ref 70–99)
Potassium: 4.3 mmol/L (ref 3.5–5.1)
SODIUM: 138 mmol/L (ref 135–145)

## 2018-04-13 LAB — CBC
HEMATOCRIT: 24.7 % — AB (ref 36.0–46.0)
Hemoglobin: 7.4 g/dL — ABNORMAL LOW (ref 12.0–15.0)
MCH: 24.8 pg — AB (ref 26.0–34.0)
MCHC: 30 g/dL (ref 30.0–36.0)
MCV: 82.9 fL (ref 78.0–100.0)
PLATELETS: 205 10*3/uL (ref 150–400)
RBC: 2.98 MIL/uL — AB (ref 3.87–5.11)
RDW: 15.3 % (ref 11.5–15.5)
WBC: 11 10*3/uL — AB (ref 4.0–10.5)

## 2018-04-13 MED ORDER — ENOXAPARIN SODIUM 40 MG/0.4ML ~~LOC~~ SOLN: 40.0000 mg | Freq: Every day | SUBCUTANEOUS | 0 refills | Status: AC

## 2018-04-13 NOTE — Clinical Social Work Placement (Signed)
   CLINICAL SOCIAL WORK PLACEMENT  NOTE  Date:  04/13/2018  Patient Details  Name: Donna Summers MRN: [id number] Date of Birth: June 10, 1920  Clinical Social Work is seeking post-discharge placement for this patient at the Skilled  Nursing Facility level of care (*CSW will initial, date and re-position this form in  chart as items are completed):  Yes   Patient/family provided with [facility] Clinical Social Work Department's list of facilities offering this level of care within the geographic area requested by the patient (or if unable, by the patient's family).  Yes   Patient/family informed of their freedom to choose among providers that offer the needed level of care, that participate in Medicare, Medicaid or managed care program needed by the patient, have an available bed and are willing to accept the patient.  Yes   Patient/family informed of [facility]'s ownership interest in [facility] and [facility], as well as of the fact that they are under no obligation to receive care at these facilities.  PASRR submitted to EDS on       PASRR number received on       Existing PASRR number confirmed on 04/12/18     FL2 transmitted to all facilities in geographic area requested by pt/family on       FL2 transmitted to all facilities within larger geographic area on 04/12/18     Patient informed that his/her managed care company has contracts with or will negotiate with certain facilities, including the following:        Yes   Patient/family informed of bed offers received.  [name]     Physician recommends and patient chooses bed at      Patient to be transferred to [name] on 04/13/18.  Patient to be transferred to facility by [facility]     Patient family notified on 04/13/18 of transfer.  Name of family member notified:  son notified     PHYSICIAN       Additional Comment:     _______________________________________________ Tresa MoorePatricia V Raymon Schlarb, LCSW 04/13/2018, 11:55 AM

## 2018-04-13 NOTE — Care Management Important Message (Signed)
Important Message  Patient Details  Name: Meryle Readygnes L Zender MRN: [id number] Date of Birth: 1920-03-15   Medicare Important Message Given:  Yes    Antoney Biven Stefan ChurchBratton 04/13/2018, 12:11 PM

## 2018-04-13 NOTE — Progress Notes (Signed)
Pt is confused, fed by staff for meals but pt with very poor appetite. Ensure was given. Will discharge pt to SNF today, both [facility]. [facility]. [facility].

## 2018-04-13 NOTE — Progress Notes (Signed)
Subjective: 2 Days Post-Op Procedure(s) (LRB): INTRAMEDULLARY (IM) NAIL INTERTROCHANTRIC (Right) Patient reports pain as mild.  Still confused this am.  Objective: Vital signs in last 24 hours: Temp:  [97.6 F (36.4 C)-99.8 F (37.7 C)] 97.6 F (36.4 C) (06/26 0500) Pulse Rate:  [81-98] 98 (06/26 0500) Resp:  [14-16] 14 (06/26 0500) BP: (101-140)/(41-83) 140/55 (06/26 0500) SpO2:  [92 %-97 %] 94 % (06/26 0500) Weight:  [163 lb 12.8 oz (74.3 kg)] 163 lb 12.8 oz (74.3 kg) (06/26 0500)  Intake/Output from previous day: 06/25 0701 - 06/26 0700 In: 1443.8 [P.O.:480; I.V.:763.8; IV Piggyback:200] Out: 800 [Urine:800] Intake/Output this shift: No intake/output data recorded.  Recent Labs    04/10/18 0957 04/11/18 0413 04/12/18 0639 04/13/18 0406  HGB 10.8* 10.5* 8.2* 7.4*   Recent Labs    04/12/18 0639 04/13/18 0406  WBC 11.9* 11.0*  RBC 3.27* 2.98*  HCT 27.4* 24.7*  PLT 207 205   Recent Labs    04/12/18 0639 04/13/18 0406  NA 138 138  K 4.3 4.3  CL 106 106  CO2 24 25  BUN 24* 23  CREATININE 1.50* 1.35*  GLUCOSE 109* 104*  CALCIUM 8.3* 8.3*   Recent Labs    04/10/18 0957  INR 1.14    Neurovascular intact Sensation intact distally Intact pulses distally Dorsiflexion/Plantar flexion intact Incision: no drainage No cellulitis present Compartment soft    Assessment/Plan: 2 Days Post-Op Procedure(s) (LRB): INTRAMEDULLARY (IM) NAIL INTERTROCHANTRIC (Right) Up with therapy  WBAT RLE D/C dispo per medicine F/U with Dr. Roda ShuttersXu 2 weeks post-op    Donna HemMary L Marsela Summers 04/13/2018, 7:15 AM

## 2018-04-13 NOTE — Social Work (Signed)
Clinical Social Worker facilitated patient discharge including contacting patient family and facility to confirm patient discharge plans.  Clinical information faxed to facility and family agreeable with plan.    CSW arranged ambulance transport via [facility] to [name] .    RN to call [phone] to give report prior to discharge.  Clinical Social Worker will sign off for now as social work intervention is no longer needed. Please consult us again if new need arises.  Khaliel Morey, LCSW Clinical Social Worker [phone]    

## 2018-04-13 NOTE — Discharge Summary (Addendum)
Physician Discharge Summary  Donna Summers:[id number] DOB: [birth year] DOA: 04/10/2018  PCP: Donna Plump, MD  Admit date: 04/10/2018 Discharge date: 04/13/2018  Admitted From: Skilled nursing facility  Disposition:  SNF  Discharge Condition:Stable  CODE STATUS: DNR,  Diet recommendation: Regular  Brief/Interim Summary: 84 y.o.female ,w hypertension,ckd stage3,GERD, prior L hip fracture post surgical repair in April by Dr. Roda Summers,  presented with right femoral neckfracture s/p mechanical fall PTA.  Recently had multiple falls, she recently had left hip fracture status post surgical repair, patient is in hospice, and went for right hip fracture  surgical repair this time for pain control.  Postoperatively, patient's condition has remained stable.  She would bearing weight as tolerated. She will need to follow-up with orthopedics in 2 weeks for suture removal and wound check.  Patient will need to continue Lovenox for total of 2 weeks.  Discharge Diagnoses:  Principal Problem:   Femoral neck fracture (HCC) Active Problems:   GERD (gastroesophageal reflux disease)   HTN (hypertension)   CKD (chronic kidney disease), stage III (HCC)   Anemia   Discharge Instructions  Discharge Instructions    Diet general   Complete by:  As directed    Discharge instructions   Complete by:  As directed    Follow up with PCP at SNF in 3-5 days. Follow up with orthopedics as scheduled   Increase activity slowly   Complete by:  As directed    Weight bearing as tolerated   Complete by:  As directed      Allergies as of 04/13/2018      Reactions   Bactrim [sulfamethoxazole-trimethoprim] Other (See Comments)   unknown   Levaquin [levofloxacin] Other (See Comments)   unknown      Medication List    STOP taking these medications   HYDROcodone-acetaminophen 5-325 MG tablet Commonly known as:  NORCO/VICODIN     TAKE these medications   acetaminophen 650 MG CR tablet Commonly known  as:  TYLENOL Take 650 mg by mouth every 6 (six) hours as needed for pain.   amLODipine 10 MG tablet Commonly known as:  NORVASC Take 1 tablet (10 mg total) by mouth daily.   aspirin EC 81 MG tablet Take 81 mg by mouth daily.   brimonidine 0.15 % ophthalmic solution Commonly known as:  ALPHAGAN Place 1 drop into the left eye 3 (three) times daily.   calcium-vitamin D 500-200 MG-UNIT tablet Commonly known as:  OSCAL WITH D Take 1 tablet by mouth 3 (three) times daily.   docusate sodium 100 MG capsule Commonly known as:  COLACE Take 100 mg by mouth at bedtime.   dorzolamide 2 % ophthalmic solution Commonly known as:  TRUSOPT Place 1 drop into the left eye 3 (three) times daily.   enoxaparin 40 MG/0.4ML injection Commonly known as:  LOVENOX Inject 0.4 mLs (40 mg total) into the skin daily for 14 days.   feeding supplement (PRO-STAT SUGAR FREE 64) Liqd Take 30 mLs by mouth daily.   GENTEAL TEARS 0.1-0.2-0.3 % Soln Place 1 drop into the left eye 2 (two) times daily as needed (itching).   ipratropium-albuterol 0.5-2.5 (3) MG/3ML Soln Commonly known as:  DUONEB Take 3 mLs by nebulization every 4 (four) hours as needed (cough/wheezing).   irbesartan 75 MG tablet Commonly known as:  AVAPRO Take 37.5 mg by mouth at bedtime.   meclizine 12.5 MG tablet Commonly known as:  ANTIVERT Take 12.5 mg by mouth 3 (three) times daily as needed  for dizziness.   oxyCODONE-acetaminophen 5-325 MG tablet Commonly known as:  PERCOCET Take 1-2 tablets by mouth every 4 (four) hours as needed for severe pain.   pantoprazole 40 MG tablet Commonly known as:  PROTONIX Take 1 tablet (40 mg total) by mouth 2 (two) times daily.   polyethylene glycol packet Commonly known as:  MIRALAX / GLYCOLAX Take 17 g by mouth daily.   ROBITUSSIN COUGH+CHEST CONG DM 5-100 MG/5ML Liqd Generic drug:  Dextromethorphan-guaiFENesin Take 15 mLs by mouth every 8 (eight) hours as needed (Cough and Congestion).    travoprost (benzalkonium) 0.004 % ophthalmic solution Commonly known as:  TRAVATAN Place 1 drop into the left eye at bedtime. Take 5 mins apart from brimonidine and dorzolamide   zinc sulfate 220 (50 Zn) MG capsule Take 1 capsule (220 mg total) by mouth daily.            Discharge Care Instructions  (From admission, onward)        Start     Ordered   04/11/18 0000  Weight bearing as tolerated     04/11/18 1832     Follow-up Information    Tarry Kos, MD In 2 weeks.   Specialty:  Orthopedic Surgery Why:  For suture removal, For wound re-check Contact information: [address] [place] [address] [phone]          Allergies  Allergen Reactions  . Bactrim [Sulfamethoxazole-Trimethoprim] Other (See Comments)    unknown  . Levaquin [Levofloxacin] Other (See Comments)    unknown    Consultations: Orthopedics Dr. Roda Summers  Procedures/Studies: Dg Chest 1 View  Result Date: 04/10/2018 CLINICAL DATA:  84 year old female with history of unwitnessed fall at 5:40 a.m. this morning. Femoral neck fracture. EXAM: CHEST  1 VIEW COMPARISON:  Chest x-ray 03/07/2018. FINDINGS: Diffuse interstitial prominence and peribronchial cuffing, similar to prior examinations. No confluent consolidative airspace disease. Blunting of left costophrenic sulcus, which may suggest a small left pleural effusion. No right pleural effusion. No pneumothorax. Heart size is upper limits of normal. Upper mediastinal contours are within normal limits. Aortic atherosclerosis. Multiple old healed right-sided posterolateral rib fractures with mild posttraumatic deformity. IMPRESSION: 1. Probable small left pleural effusion. 2. Chronic interstitial prominence and mild diffuse peribronchial cuffing appear slightly worse than prior examinations. This may suggest an associated acute bronchitis. 3. Aortic atherosclerosis. 4. Multiple old posterolateral right-sided rib fractures with posttraumatic  deformity. Electronically Signed   By: Trudie Reed M.D.   On: 04/10/2018 11:23   Dg Pelvis 1-2 Views  Result Date: 04/10/2018 CLINICAL DATA:  Pt arrives via EMS from [facility] with reports of a witnessed fall at 5:40 AM. Pt endorses right hip pain, facility reports Xray showed fx of R femoral neck with impaction without displacement. EXAM: PELVIS - 1-2 VIEW COMPARISON:  02/11/2018. FINDINGS: There is a nondisplaced fracture of the subcapital right femoral neck. There is no significant angulation. No other fracture. Left hip prosthesis appears well seated and aligned. Right hip joint, SI joints and symphysis pubis are normally aligned. Bones are diffusely demineralized. IMPRESSION: Subcapital fracture of the right femoral neck. Electronically Signed   By: Amie Portland M.D.   On: 04/10/2018 11:29   Ct Hip Right Wo Contrast  Result Date: 04/11/2018 CLINICAL DATA:  Right hip pain status post fall EXAM: CT OF THE RIGHT HIP WITHOUT CONTRAST TECHNIQUE: Multidetector CT imaging of the right hip was performed according to the standard protocol. Multiplanar CT image reconstructions were also  generated. COMPARISON:  None. FINDINGS: Bones/Joint/Cartilage Generalized osteopenia. Acute impacted right subcapital femoral fracture. Acute comminuted intertrochanteric fracture. No other acute fracture or dislocation. No dislocation. Mild osteoarthritis of the right hip. Ligaments Suboptimally assessed by CT. Muscles and Tendons Muscles are normal.  No intramuscular fluid collection. Soft tissues No fluid collection or hematoma. Peripheral vascular atherosclerotic disease. IMPRESSION: 1. Acute impacted right subcapital femoral fracture. Acute comminuted right intertrochanteric fracture. Electronically Signed   By: Elige KoHetal  Patel   On: 04/11/2018 12:20   Dg C-arm 1-60 Min  Result Date: 04/11/2018 CLINICAL DATA:  84 year old female with right femoral neck/intertrochanteric fracture. Subsequent encounter.  EXAM: DG C-ARM 61-120 MIN; RIGHT FEMUR 2 VIEWS 1 minutes and 32 seconds. COMPARISON:  04/11/2018 CT.  04/10/2018 plain film exam. FINDINGS: Four C arm views submitted for review after surgery. Right long stem intramedullary femoral rod with proximal sliding type screws placed for fixation of right femoral neck/intertrochanteric fracture. IMPRESSION: Open reduction and internal fixation of right femoral neck/intertrochanteric fracture. Electronically Signed   By: Lacy DuverneySteven  Olson M.D.   On: 04/11/2018 18:50   Dg Femur, Min 2 Views Right  Result Date: 04/11/2018 CLINICAL DATA:  84 year old female with right femoral neck/intertrochanteric fracture. Subsequent encounter. EXAM: DG C-ARM 61-120 MIN; RIGHT FEMUR 2 VIEWS 1 minutes and 32 seconds. COMPARISON:  04/11/2018 CT.  04/10/2018 plain film exam. FINDINGS: Four C arm views submitted for review after surgery. Right long stem intramedullary femoral rod with proximal sliding type screws placed for fixation of right femoral neck/intertrochanteric fracture. IMPRESSION: Open reduction and internal fixation of right femoral neck/intertrochanteric fracture. Electronically Signed   By: Lacy DuverneySteven  Olson M.D.   On: 04/11/2018 18:50   Dg Femur, Min 2 Views Right  Result Date: 04/10/2018 CLINICAL DATA:  [facility] with reports of a witnessed fall at 5:40 AM. Pt endorses right hip pain, facility reports Xray showed fx of R femoral neck with impaction without displacement. EXAM: RIGHT FEMUR 2 VIEWS COMPARISON:  Current radiograph of the pelvis. FINDINGS: Nondisplaced subcapital fracture of the right femoral neck. No other fractures.  Hip and knee joints are normally aligned. Soft tissues are unremarkable. IMPRESSION: Nondisplaced subcapital fracture of the right femoral neck. Electronically Signed   By: Amie Portlandavid  Ormond M.D.   On: 04/10/2018 11:28       Subjective: Patient denies interval complaints.  Denies increasing pain, shortness of  breath fever or cough.  Discharge Exam: Vitals:   04/12/18 2044 04/13/18 0500  BP: (!) 103/41 (!) 140/55  Pulse: 85 98  Resp: 16 14  Temp: 99.8 F (37.7 C) 97.6 F (36.4 C)  SpO2: 96% 94%   Vitals:   04/12/18 1355 04/12/18 1450 04/12/18 2044 04/13/18 0500  BP: (!) 101/44  (!) 103/41 (!) 140/55  Pulse: 89  85 98  Resp: 16  16 14   Temp: 98.3 F (36.8 C)  99.8 F (37.7 C) 97.6 F (36.4 C)  TempSrc: Oral  Oral Oral  SpO2:  92% 96% 94%  Weight:    74.3 kg (163 lb 12.8 oz)  Height:        General: Pt is alert, awake, not in acute distress, on nasal cannula, oriented x1, Confused at baseline Cardiovascular: RRR, S1/S2 +, no rubs, no gallops Respiratory: CTA bilaterally, no wheezing, no rhonchi Abdominal: Soft, NT, ND, bowel sounds + Extremities: no edema, no cyanosis.  Pulses palpable.  Right hip with a dressing at the site of surgery. CNS nonfocal.  The results  of significant diagnostics from this hospitalization (including imaging, microbiology, ancillary and laboratory) are listed below for reference.     Microbiology: Recent Results (from the past 240 hour(s))  MRSA PCR Screening     Status: None   Collection Time: 04/10/18  1:38 PM  Result Value Ref Range Status   MRSA by PCR NEGATIVE NEGATIVE Final    Comment:        The GeneXpert MRSA Assay (FDA approved for NASAL specimens only), is one component of a comprehensive MRSA colonization surveillance program. It is not intended to diagnose MRSA infection nor to guide or monitor treatment for MRSA infections. Performed at [facility], [address] [place], [place] [address]      Labs: BNP (last 3 results) No results for input(s): BNP in the last 8760 hours. Basic Metabolic Panel: Recent Labs  Lab 04/10/18 0957 04/11/18 0413 04/12/18 0639 04/13/18 0406  NA 140 141 138 138  K 3.9 4.4 4.3 4.3  CL 108 105 106 106  CO2 23 27 24 25   GLUCOSE 114* 97 109* 104*  BUN 22* 21* 24* 23  CREATININE  1.40* 1.28* 1.50* 1.35*  CALCIUM 9.1 9.0 8.3* 8.3*   Liver Function Tests: Recent Labs  Lab 04/11/18 0413  AST 18  ALT 10*  ALKPHOS 94  BILITOT 0.7  PROT 6.1*  ALBUMIN 2.9*   No results for input(s): LIPASE, AMYLASE in the last 168 hours. No results for input(s): AMMONIA in the last 168 hours. CBC: Recent Labs  Lab 04/10/18 0957 04/11/18 0413 04/12/18 0639 04/13/18 0406  WBC 13.2* 9.7 11.9* 11.0*  NEUTROABS 9.3*  --   --   --   HGB 10.8* 10.5* 8.2* 7.4*  HCT 35.9* 35.3* 27.4* 24.7*  MCV 82.5 83.5 83.8 82.9  PLT 296 250 207 205   Cardiac Enzymes: No results for input(s): CKTOTAL, CKMB, CKMBINDEX, TROPONINI in the last 168 hours. BNP: Invalid input(s): POCBNP CBG: No results for input(s): GLUCAP in the last 168 hours. D-Dimer No results for input(s): DDIMER in the last 72 hours. Hgb A1c No results for input(s): HGBA1C in the last 72 hours. Lipid Profile Recent Labs    04/11/18 0413  CHOL 202*  HDL 34*  LDLCALC 128*  TRIG 199*  CHOLHDL 5.9   Thyroid function studies Recent Labs    04/10/18 1329  TSH 2.240   Anemia work up No results for input(s): VITAMINB12, FOLATE, FERRITIN, TIBC, IRON, RETICCTPCT in the last 72 hours. Urinalysis    Component Value Date/Time   COLORURINE YELLOW 04/10/2018 1840   APPEARANCEUR HAZY (A) 04/10/2018 1840   LABSPEC 1.017 04/10/2018 1840   PHURINE 6.0 04/10/2018 1840   GLUCOSEU NEGATIVE 04/10/2018 1840   HGBUR NEGATIVE 04/10/2018 1840   BILIRUBINUR NEGATIVE 04/10/2018 1840   KETONESUR NEGATIVE 04/10/2018 1840   PROTEINUR NEGATIVE 04/10/2018 1840   NITRITE NEGATIVE 04/10/2018 1840   LEUKOCYTESUR TRACE (A) 04/10/2018 1840   Sepsis Labs Invalid input(s): PROCALCITONIN,  WBC,  LACTICIDVEN Microbiology Recent Results (from the past 240 hour(s))  MRSA PCR Screening     Status: None   Collection Time: 04/10/18  1:38 PM  Result Value Ref Range Status   MRSA by PCR NEGATIVE NEGATIVE Final    Comment:        The  GeneXpert MRSA Assay (FDA approved for NASAL specimens only), is one component of a comprehensive MRSA colonization surveillance program. It is not intended to diagnose MRSA infection nor to guide or monitor treatment for  MRSA infections. Performed at [facility], [address], [place], [place] [address]     Please note: You were cared for by a hospitalist during your hospital stay. Once you are discharged, your primary care physician will handle any further medical issues. Please note that NO REFILLS for any discharge medications will be authorized once you are discharged, as it is imperative that you return to your primary care physician (or establish a relationship with a primary care physician if you do not have one) for your post hospital discharge needs so that they can reassess your need for medications and monitor your lab values.  Time coordinating discharge: 40 minutes  SIGNED:   Joycelyn Das, MD  [facility] 04/13/2018, 11:29 AM Pager [phone]  If 7PM-7AM, please contact night-coverage [email or web address] Password [email or web address]

## 2018-04-13 NOTE — NC FL2 (Signed)
[place] MEDICAID FL2 LEVEL OF CARE SCREENING TOOL     IDENTIFICATION  Patient Name: Donna Summers Birthdate: 20-Jul-1920 Sex: female Admission Date (Current Location): 04/10/2018  [facility] and [place] Number:  Producer, television/film/video and Address:  [facility], [address], [place], [place] [address]      Provider Number: [id number]  Attending Physician Name and Address:  Joycelyn Das, MD  Relative Name and Phone Number:  son, Janyth Pupa [phone]    Current Level of Care: Hospital Recommended Level of Care: Skilled Nursing Facility Prior Approval Number:    Date Approved/Denied:   PASRR Number: [id number]  Discharge Plan: SNF    Current Diagnoses: Patient Active Problem List   Diagnosis Date Noted  . Femoral neck fracture (HCC) 04/10/2018  . Anemia 04/10/2018  . UTI (urinary tract infection) 03/08/2018  . Palliative care by specialist   . Goals of care, counseling/discussion   . Shortness of breath   . Generalized pain   . Glaucoma 03/07/2018  . Fall 03/07/2018  . History of left hip hemiarthroplasty 02/11/2018  . Pressure injury of skin 02/01/2018  . Nausea and vomiting in adult   . Closed left hip fracture, initial encounter (HCC) 01/28/2018  . CKD (chronic kidney disease), stage III (HCC) 01/28/2018  . Hypertensive urgency   . Dysphagia 01/20/2018  . Acute lower UTI 01/20/2018  . Dehydration 01/20/2018  . Weakness generalized 01/20/2018  . Symptomatic cholelithiasis 01/19/2018  . Cholelithiasis 01/19/2018  . Cholecystitis 01/06/2018  . Constipation 01/06/2018  . History of urinary retention 01/06/2018  . Abdominal pain 01/06/2018  . Vitamin D deficiency 01/06/2018  . HLD (hyperlipidemia) 01/06/2018  . GERD (gastroesophageal reflux disease) 01/06/2018  . HTN (hypertension) 01/06/2018    Orientation RESPIRATION BLADDER Height & Weight     Self  O2(Nasal Cannula 2L) Incontinent Weight: 163 lb 12.8 oz (74.3 kg) Height:   5\' 2"  (157.5 cm)  BEHAVIORAL SYMPTOMS/MOOD NEUROLOGICAL BOWEL NUTRITION STATUS      Continent Diet(See DC Summary)  AMBULATORY STATUS COMMUNICATION OF NEEDS Skin   Extensive Assist Verbally Surgical wounds                       Personal Care Assistance Level of Assistance  Bathing, Dressing, Feeding Bathing Assistance: Maximum assistance Feeding assistance: Maximum assistance Dressing Assistance: Maximum assistance     Functional Limitations Info  Sight, Hearing, Speech Sight Info: Adequate Hearing Info: Adequate Speech Info: Adequate    SPECIAL CARE FACTORS FREQUENCY  PT (By licensed PT), OT (By licensed OT)     PT Frequency: weekly OT Frequency: weekly            Contractures Contractures Info: Not present    Additional Factors Info  Code Status, Allergies Code Status Info: DNR Allergies Info: BACTRIM SULFAMETHOXAZOLE-TRIMETHOPRIM, LEVAQUIN LEVOFLOXACIN            Current Medications (04/13/2018):  This is the current hospital active medication list Current Facility-Administered Medications  Medication Dose Route Frequency Provider Last Rate Last Dose  . 0.9 %  sodium chloride infusion  250 mL Intravenous PRN Tarry Kos, MD 10 mL/hr at 04/12/18 2114 20 mL at 04/12/18 2114  . acetaminophen (TYLENOL) tablet 325-650 mg  325-650 mg Oral Q6H PRN Tarry Kos, MD      . alum & mag hydroxide-simeth (MAALOX/MYLANTA) 200-200-20 MG/5ML suspension 30 mL  30 mL Oral Q4H PRN Tarry Kos, MD      .  aspirin EC tablet 81 mg  81 mg Oral Daily Tarry KosXu, Naiping M, MD   81 mg at 04/13/18 1033  . brimonidine (ALPHAGAN) 0.15 % ophthalmic solution 1 drop  1 drop Left Eye TID Tarry KosXu, Naiping M, MD   1 drop at 04/13/18 1032  . docusate sodium (COLACE) capsule 100 mg  100 mg Oral BID Tarry KosXu, Naiping M, MD   100 mg at 04/13/18 1030  . dorzolamide (TRUSOPT) 2 % ophthalmic solution 1 drop  1 drop Left Eye TID Tarry KosXu, Naiping M, MD   1 drop at 04/13/18 1031  . enoxaparin (LOVENOX) injection 30 mg   30 mg Subcutaneous Q24H Tarry KosXu, Naiping M, MD   30 mg at 04/13/18 0804  . feeding supplement (PRO-STAT SUGAR FREE 64) liquid 30 mL  30 mL Oral Daily Tarry KosXu, Naiping M, MD   30 mL at 04/13/18 1031  . fentaNYL (SUBLIMAZE) injection 50 mcg  50 mcg Intravenous Q30 min PRN Tarry KosXu, Naiping M, MD   50 mcg at 04/10/18 1305  . ferrous sulfate tablet 325 mg  325 mg Oral BID WC Elgergawy, Leana Roeawood S, MD   325 mg at 04/13/18 0806  . HYDROcodone-acetaminophen (NORCO) 7.5-325 MG per tablet 1-2 tablet  1-2 tablet Oral Q4H PRN Tarry KosXu, Naiping M, MD   2 tablet at 04/11/18 1909  . HYDROcodone-acetaminophen (NORCO/VICODIN) 5-325 MG per tablet 1-2 tablet  1-2 tablet Oral Q4H PRN Tarry KosXu, Naiping M, MD   1 tablet at 04/13/18 0755  . ipratropium-albuterol (DUONEB) 0.5-2.5 (3) MG/3ML nebulizer solution 3 mL  3 mL Nebulization Q4H PRN Tarry KosXu, Naiping M, MD      . lactated ringers infusion   Intravenous Continuous Tarry KosXu, Naiping M, MD 10 mL/hr at 04/11/18 1657    . latanoprost (XALATAN) 0.005 % ophthalmic solution 1 drop  1 drop Left Eye QHS Tarry KosXu, Naiping M, MD   1 drop at 04/11/18 2115  . meclizine (ANTIVERT) tablet 12.5 mg  12.5 mg Oral TID PRN Tarry KosXu, Naiping M, MD      . menthol-cetylpyridinium (CEPACOL) lozenge 3 mg  1 lozenge Oral PRN Tarry KosXu, Naiping M, MD       Or  . phenol (CHLORASEPTIC) mouth spray 1 spray  1 spray Mouth/Throat PRN Tarry KosXu, Naiping M, MD      . metoCLOPramide (REGLAN) tablet 5-10 mg  5-10 mg Oral Q8H PRN Tarry KosXu, Naiping M, MD       Or  . metoCLOPramide (REGLAN) injection 5-10 mg  5-10 mg Intravenous Q8H PRN Tarry KosXu, Naiping M, MD      . morphine 2 MG/ML injection 0.5-1 mg  0.5-1 mg Intravenous Q2H PRN Tarry KosXu, Naiping M, MD      . ondansetron [facility](ZOFRAN) tablet 4 mg  4 mg Oral Q6H PRN Tarry KosXu, Naiping M, MD       Or  . ondansetron [facility](ZOFRAN) injection 4 mg  4 mg Intravenous Q6H PRN Tarry KosXu, Naiping M, MD      . pantoprazole (PROTONIX) EC tablet 40 mg  40 mg Oral BID Tarry KosXu, Naiping M, MD   40 mg at 04/13/18 1030  . polyethylene glycol (MIRALAX / GLYCOLAX) packet 17 g  17  g Oral Daily Tarry KosXu, Naiping M, MD   17 g at 04/13/18 1032  . polyvinyl alcohol (LIQUIFILM TEARS) 1.4 % ophthalmic solution 1 drop  1 drop Left Eye BID PRN Tarry KosXu, Naiping M, MD      . sodium chloride flush (NS) 0.9 % injection 3 mL  3 mL Intravenous Q12H Tarry KosXu, Naiping M, MD  3 mL at 04/13/18 1033  . sodium chloride flush (NS) 0.9 % injection 3 mL  3 mL Intravenous PRN Tarry Kos, MD         Discharge Medications: Please see discharge summary for a list of discharge medications.  Relevant Imaging Results:  Relevant Lab Results:   Additional Information SS: [id number]  Tresa Moore, LCSW

## 2018-04-14 LAB — CBC
HCT: 27.4 % — ABNORMAL LOW (ref 36.0–46.0)
Hemoglobin: 8.2 g/dL — ABNORMAL LOW (ref 12.0–15.0)
MCH: 25.1 pg — AB (ref 26.0–34.0)
MCHC: 29.9 g/dL — AB (ref 30.0–36.0)
MCV: 83.8 fL (ref 78.0–100.0)
PLATELETS: 207 10*3/uL (ref 150–400)
RBC: 3.27 MIL/uL — AB (ref 3.87–5.11)
RDW: 15.2 % (ref 11.5–15.5)
WBC: 11.9 10*3/uL — ABNORMAL HIGH (ref 4.0–10.5)

## 2018-04-17 NOTE — Anesthesia Preprocedure Evaluation (Signed)
Anesthesia Evaluation  Patient identified by MRN, date of birth, ID band Patient awake    Reviewed: Allergy & Precautions, NPO status , Patient's Chart, lab work & pertinent test results  History of Anesthesia Complications Negative for: history of anesthetic complications  Airway Mallampati: II  TM Distance: >3 FB Neck ROM: Full    Dental  (+) Dental Advisory Given   Pulmonary shortness of breath,    breath sounds clear to auscultation       Cardiovascular hypertension,  Rhythm:Regular     Neuro/Psych CVA    GI/Hepatic GERD  ,  Endo/Other    Renal/GU CRFRenal disease     Musculoskeletal   Abdominal   Peds  Hematology  (+) anemia ,   Anesthesia Other Findings   Reproductive/Obstetrics                             Anesthesia Physical Anesthesia Plan  ASA: III  Anesthesia Plan: General   Post-op Pain Management:    Induction: Intravenous  PONV Risk Score and Plan: 3 and Ondansetron and Dexamethasone  Airway Management Planned: Oral ETT and LMA  Additional Equipment: None  Intra-op Plan:   Post-operative Plan: Extubation in OR  Informed Consent: I have reviewed the patients History and Physical, chart, labs and discussed the procedure including the risks, benefits and alternatives for the proposed anesthesia with the patient or authorized representative who has indicated his/her understanding and acceptance.   Dental advisory given  Plan Discussed with: CRNA and Surgeon  Anesthesia Plan Comments:         Anesthesia Quick Evaluation

## 2018-04-26 ENCOUNTER — Inpatient Hospital Stay (INDEPENDENT_AMBULATORY_CARE_PROVIDER_SITE_OTHER): Payer: Medicare Other | Admitting: Orthopaedic Surgery

## 2018-04-27 ENCOUNTER — Telehealth (INDEPENDENT_AMBULATORY_CARE_PROVIDER_SITE_OTHER): Payer: Self-pay | Admitting: Orthopaedic Surgery

## 2018-04-27 NOTE — Telephone Encounter (Signed)
They can remove staples but I do need to see her for f/u though

## 2018-04-27 NOTE — Telephone Encounter (Signed)
See message below °

## 2018-04-27 NOTE — Telephone Encounter (Signed)
Misty StanleyLisa, from [facility], called stating that the patient has an appointment to get her staples removed.  Misty StanleyLisa would like to know if she could get permission to remove those staples or does Dr. Roda ShuttersXu need to see her in addition to removing the staples.  CB#[phone].  Thank you.

## 2018-04-28 NOTE — Telephone Encounter (Signed)
Tried to call patient no answer, LMOM just need to advise on message below.

## 2018-04-29 NOTE — Telephone Encounter (Signed)
Called and lm on vm to advise tht it is ok to remove the staples but the pt still needs to be seen per Dr. Roda ShuttersXu request for follow up. The pt has an appt on 05/03/18 to keep that appt but again may remove the staples. To call back with questions.

## 2018-04-29 NOTE — Telephone Encounter (Signed)
Left a full message on Lisa's vm: ok for her to remove the patient's staples, but she still needs to come in on 7/16 for her followup with Dr. Roda ShuttersXu.

## 2018-05-03 ENCOUNTER — Ambulatory Visit (INDEPENDENT_AMBULATORY_CARE_PROVIDER_SITE_OTHER): Payer: Medicare Other | Admitting: Orthopaedic Surgery

## 2018-05-03 ENCOUNTER — Ambulatory Visit (INDEPENDENT_AMBULATORY_CARE_PROVIDER_SITE_OTHER): Payer: No Typology Code available for payment source

## 2018-05-03 ENCOUNTER — Encounter (INDEPENDENT_AMBULATORY_CARE_PROVIDER_SITE_OTHER): Payer: Self-pay | Admitting: Orthopaedic Surgery

## 2018-05-03 DIAGNOSIS — Z96641 Presence of right artificial hip joint: Secondary | ICD-10-CM

## 2018-05-03 DIAGNOSIS — S72141D Displaced intertrochanteric fracture of right femur, subsequent encounter for closed fracture with routine healing: Secondary | ICD-10-CM

## 2018-05-03 NOTE — Progress Notes (Signed)
Post-Op Visit Note   Patient: Donna Summers           Date of Birth: 23-Sep-1920           MRN: [id number] Visit Date: 05/03/2018 PCP: Wanda PlumpPaz, Jose E, MD   Assessment & Plan:  Chief Complaint:  Chief Complaint  Patient presents with  . Right Hip - Pain, Follow-up    IM NAIL INTERTROCHANTRIC -RIGHT- 04/11/18   Visit Diagnoses:  1. Closed intertrochanteric fracture of hip, right, with routine healing, subsequent encounter     Plan: Patient is a pleasant 84 year old female who presents to our clinic today 22 days status post right hip IM nail, date of surgery 04/11/2018.  She has [facility].  She and her family have elected to not partake in formal physical therapy as she has had multiple falls and breaks over the past 90 days. Moderate pain, but this is controlled with norco.  Examination of the right hip shows a well healing surgical incision with staples intact.  No evidence of cellulitis or infection.   Today, staples removed.  She will [facility].  Follow up with us in 3 weeks.  Follow-Up Instructions: Return in about 3 weeks (around 05/24/2018).   Orders:  Orders Placed This Encounter  Procedures  . XR FEMUR, MIN 2 VIEWS RIGHT   No orders of the defined types were placed in this encounter.   Imaging: Xr Femur, Min 2 Views Right  Result Date: 05/03/2018 Stable alignment of fracture.  No collapse of femoral head   PMFS History: Patient Active Problem List   Diagnosis Date Noted  . Femoral neck fracture (HCC) 04/10/2018  . Anemia 04/10/2018  . UTI (urinary tract infection) 03/08/2018  . Palliative care by specialist   . Goals of care, counseling/discussion   . Shortness of breath   . Generalized pain   . Glaucoma 03/07/2018  . Fall 03/07/2018  . History of left hip hemiarthroplasty 02/11/2018  . Pressure injury of skin 02/01/2018  . Nausea and vomiting in adult   . Closed left hip fracture, initial encounter  (HCC) 01/28/2018  . CKD (chronic kidney disease), stage III (HCC) 01/28/2018  . Hypertensive urgency   . Dysphagia 01/20/2018  . Acute lower UTI 01/20/2018  . Dehydration 01/20/2018  . Weakness generalized 01/20/2018  . Symptomatic cholelithiasis 01/19/2018  . Cholelithiasis 01/19/2018  . Cholecystitis 01/06/2018  . Constipation 01/06/2018  . History of urinary retention 01/06/2018  . Abdominal pain 01/06/2018  . Vitamin D deficiency 01/06/2018  . HLD (hyperlipidemia) 01/06/2018  . GERD (gastroesophageal reflux disease) 01/06/2018  . HTN (hypertension) 01/06/2018   Past Medical History:  Diagnosis Date  . Anemia 04/10/2018  . Constipation   . GERD (gastroesophageal reflux disease)   . Glaucoma   . History of carotid artery disease   . Hyperlipidemia   . Hypertension   . Stroke [facility](HCC) 03/07/2018   old lacunar infarcts on CT scan brain 03/07/2018  . UTI (urinary tract infection)   . Vertigo     History reviewed. No pertinent family history.  Past Surgical History:  Procedure Laterality Date  . ANTERIOR APPROACH HEMI HIP ARTHROPLASTY Left 01/28/2018   Procedure: DIRECT ANTERIOR APPROACH HEMI HIP ARTHROPLASTY;  Surgeon: Tarry KosXu, Naiping M, MD;  Location: [facility] OR;  Service: Orthopedics;  Laterality: Left;  . ESOPHAGOGASTRODUODENOSCOPY (EGD) WITH PROPOFOL N/A 01/22/2018   Procedure: ESOPHAGOGASTRODUODENOSCOPY (EGD) WITH PROPOFOL;  Surgeon: Graylin ShiverGanem, Salem F, MD;  Location:  [facility] ENDOSCOPY;  Service: Endoscopy;  Laterality: N/A;  . INTRAMEDULLARY (IM) NAIL INTERTROCHANTERIC Right 04/11/2018   Procedure: INTRAMEDULLARY (IM) NAIL INTERTROCHANTRIC;  Surgeon: Tarry Kos, MD;  Location: [facility] OR;  Service: Orthopedics;  Laterality: Right;  . UNKNOWN SURGICAL HISTORY     Social History   Occupational History  . Not on file  Tobacco Use  . Smoking status: Never Smoker  . Smokeless tobacco: Never Used  Substance and Sexual Activity  . Alcohol use: Not Currently    Frequency: Never  . Drug use:  Never  . Sexual activity: Not Currently

## 2018-05-31 ENCOUNTER — Ambulatory Visit (INDEPENDENT_AMBULATORY_CARE_PROVIDER_SITE_OTHER): Admitting: Orthopaedic Surgery

## 2018-05-31 ENCOUNTER — Telehealth (INDEPENDENT_AMBULATORY_CARE_PROVIDER_SITE_OTHER): Payer: Self-pay | Admitting: Orthopaedic Surgery

## 2018-05-31 NOTE — Telephone Encounter (Signed)
Patient's son called stating that his mother's dementia is so advanced that he does not think it will be beneficial for her to be transported to the doctor's office.  He was wanting to know if her appointment needs to r/s.  CB#[phone].  Thank you.

## 2018-05-31 NOTE — Telephone Encounter (Signed)
Yes- they canceled appt for today

## 2019-03-20 DEATH — deceased

## 2019-08-10 IMAGING — US US ABDOMEN COMPLETE
1 series · 13 of 25 positions shown · non-contrast
Comparison: None.

CLINICAL DATA: Right upper quadrant abdominal pain for the past
week. Nausea.

EXAM:
ABDOMEN ULTRASOUND COMPLETE

[Series 1: us abdomen complete · 13 of 99 slices shown]
[im 1/99]
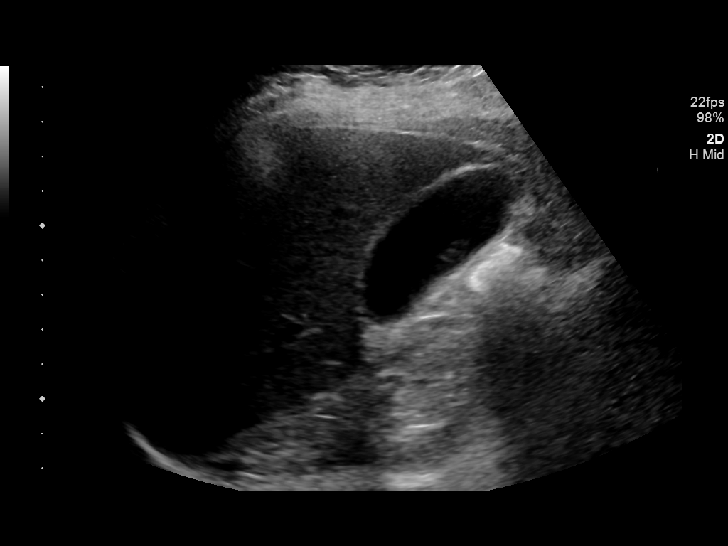
[im 9/99]
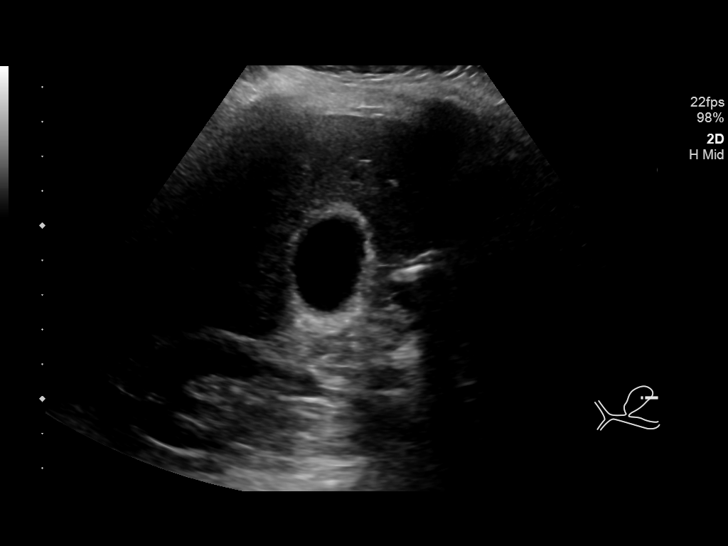
[im 17/99]
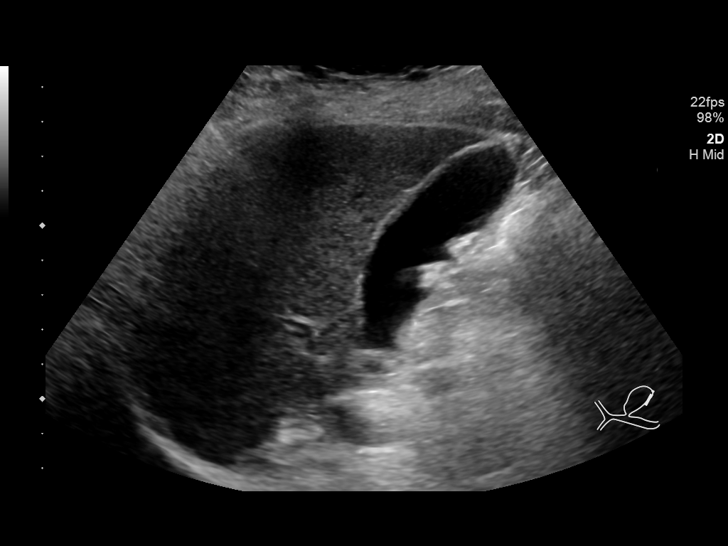
[im 25/99]
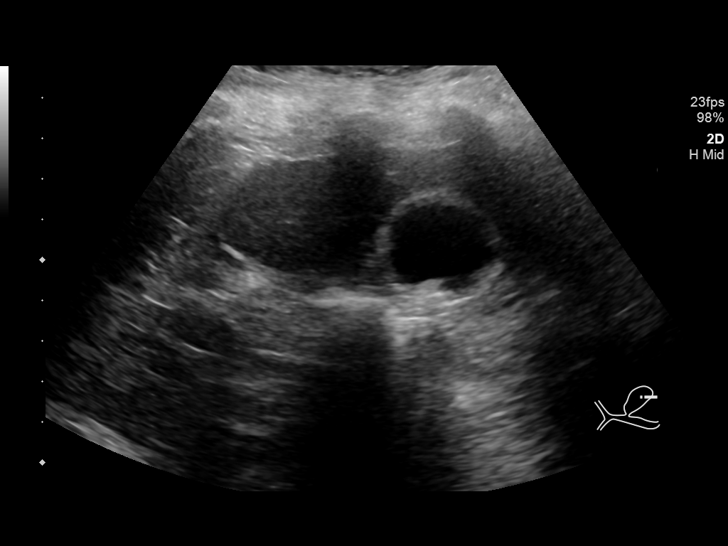
[im 33/99]
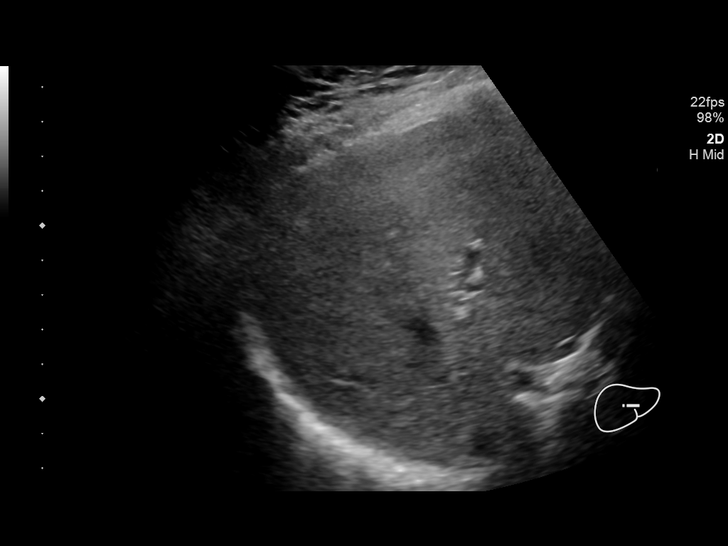
[im 41/99]
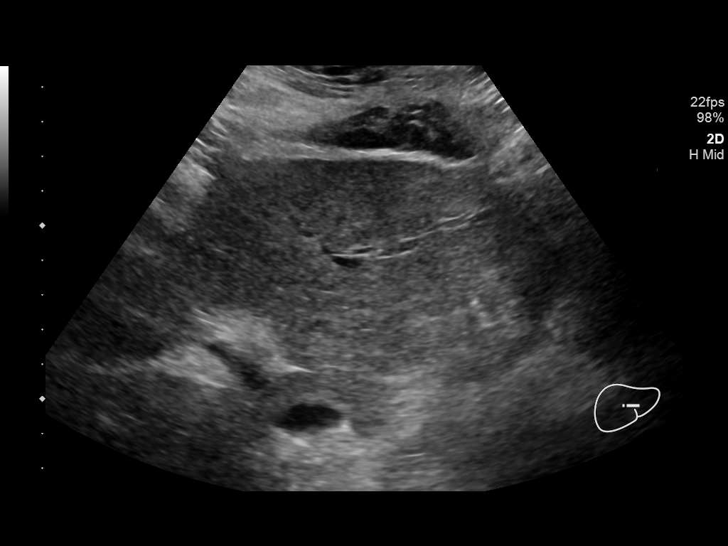
[im 50/99]
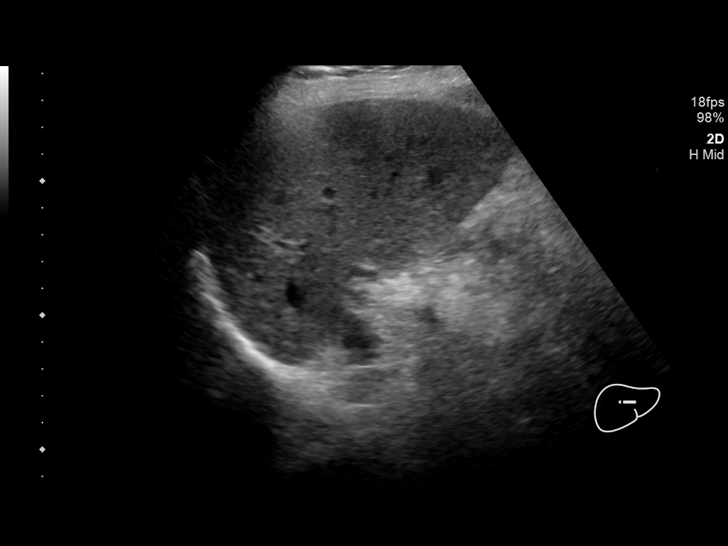
[im 58/99]
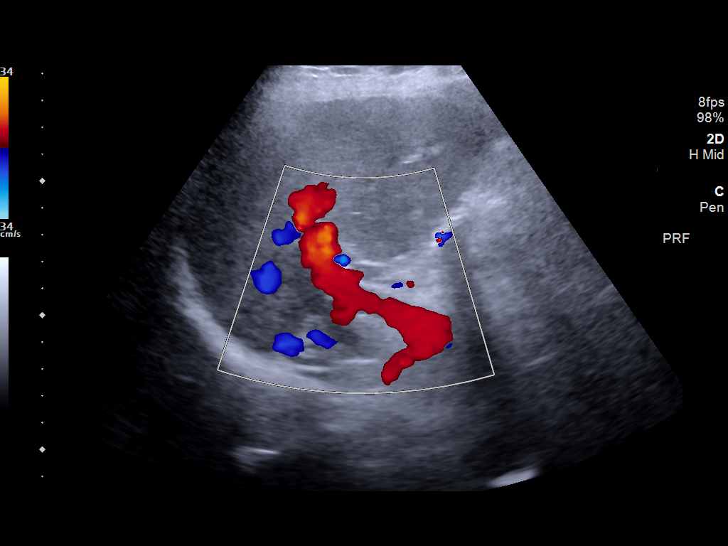
[im 66/99]
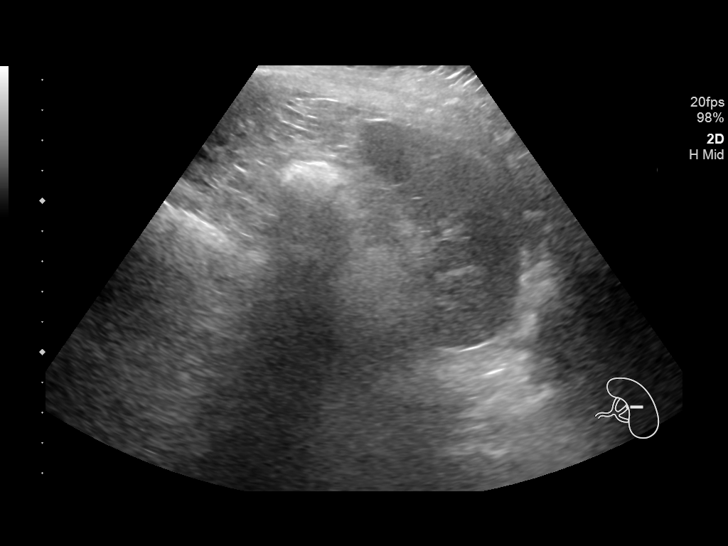
[im 74/99]
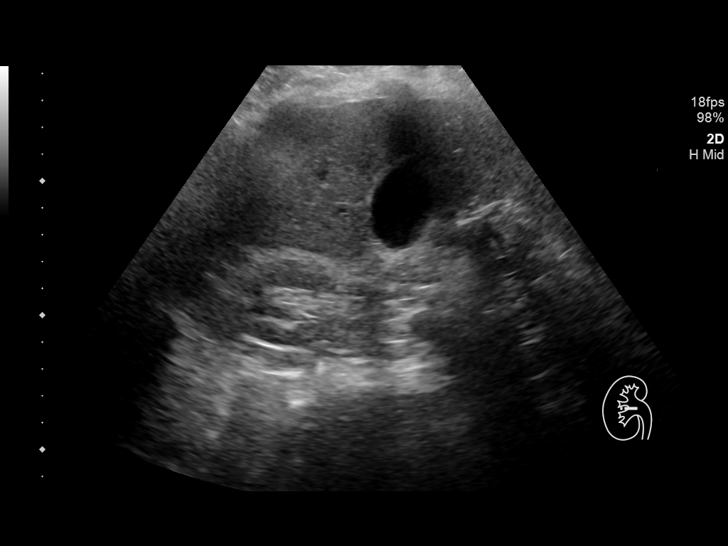
[im 82/99]
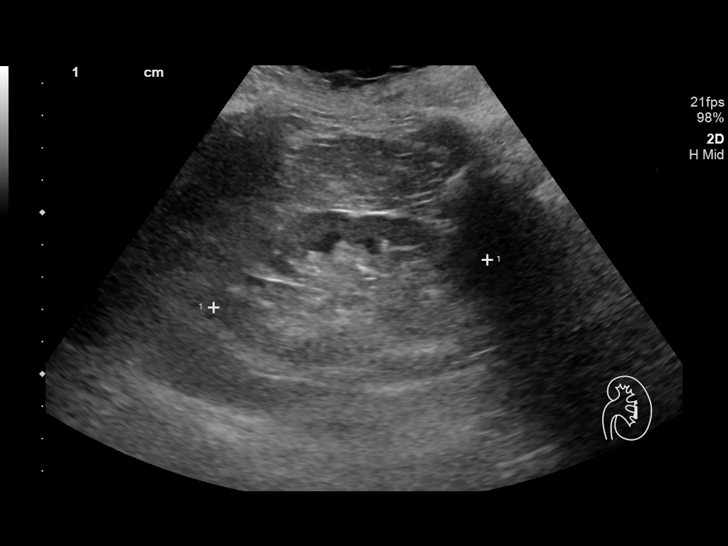
[im 90/99]
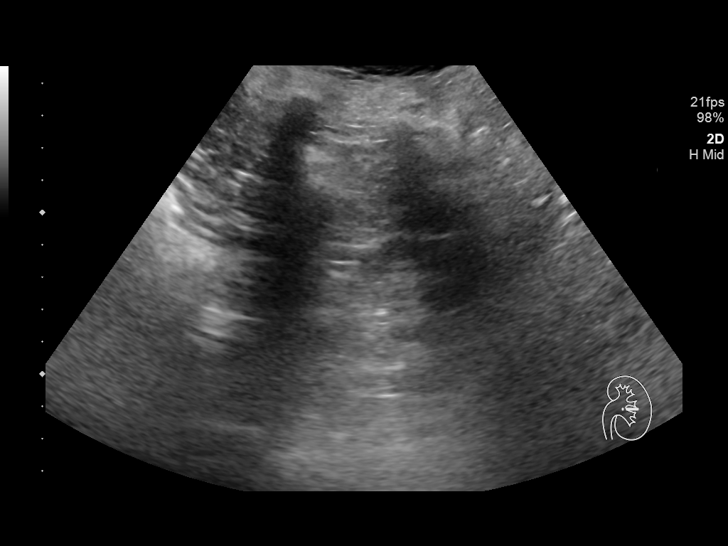
[im 99/99]
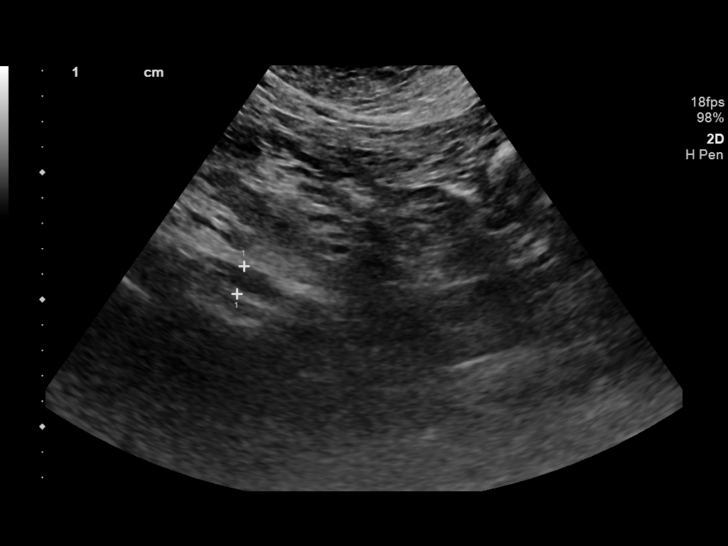

[13 of 25 positions shown; findings below may reference images not displayed]

FINDINGS: Gallbladder: Multiple gallstones in the gallbladder, the largest
measuring 1.1 cm in maximum diameter. Mild diffuse gallbladder wall
thickening with a maximum thickness of 3.4 mm. No pericholecystic
fluid. No sonographic Murphy sign.

Common bile duct: Diameter: 6.9 mm

Liver: No focal lesion identified. Within normal limits in
parenchymal echogenicity. Portal vein is patent on color Doppler
imaging with normal direction of blood flow towards the liver.

IVC: No abnormality visualized.

Pancreas: Visualized portion unremarkable.

Spleen: Size and appearance within normal limits.

Right Kidney: Length: 8.7 cm. Diffuse cortical thinning. Prominent
renal sinus fat. Normal echotexture. No hydronephrosis.

Left Kidney: Length: 8.6 cm. Diffuse cortical thinning. Prominent
renal sinus fat. Normal echotexture. No hydronephrosis.

Abdominal aorta: No aneurysm visualized.

Other findings: None.
IMPRESSION: 1. Cholelithiasis.
2. Mild diffuse gallbladder wall thickening. This is most likely due
to chronic cholecystitis.
3. No biliary ductal dilatation.
4. Diffuse bilateral renal cortical atrophy.

## 2019-08-10 IMAGING — CR DG ABDOMEN ACUTE W/ 1V CHEST
3 series · 3 of 3 positions shown · non-contrast
Comparison: None.

CLINICAL DATA: Abdominal pain.  Gallstones.

EXAM:
DG ABDOMEN ACUTE W/ 1V CHEST

[x chest ap]
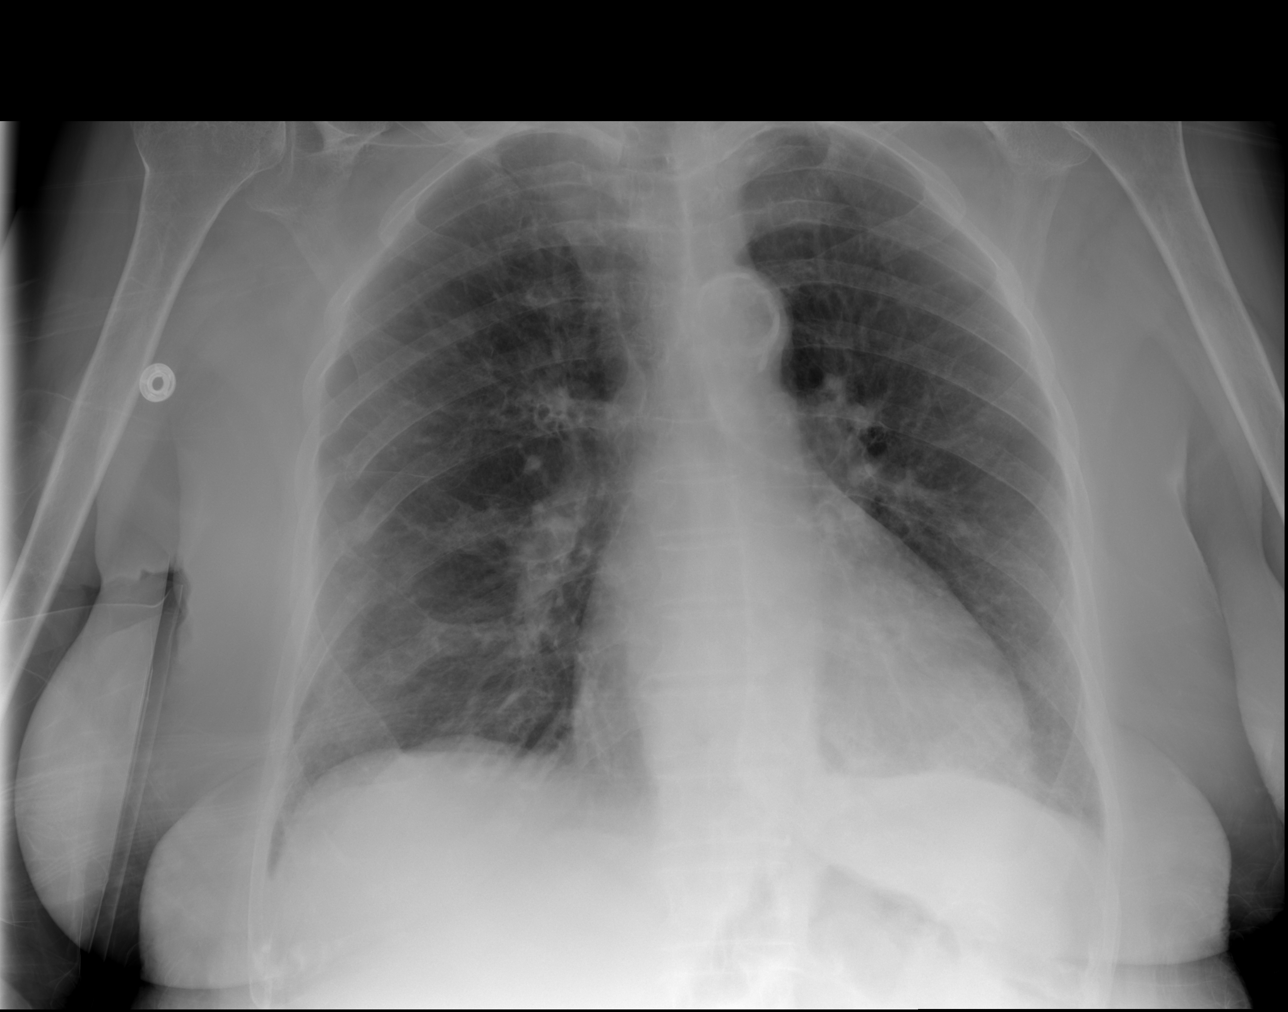

[x abdomen supine (1 of 2)]
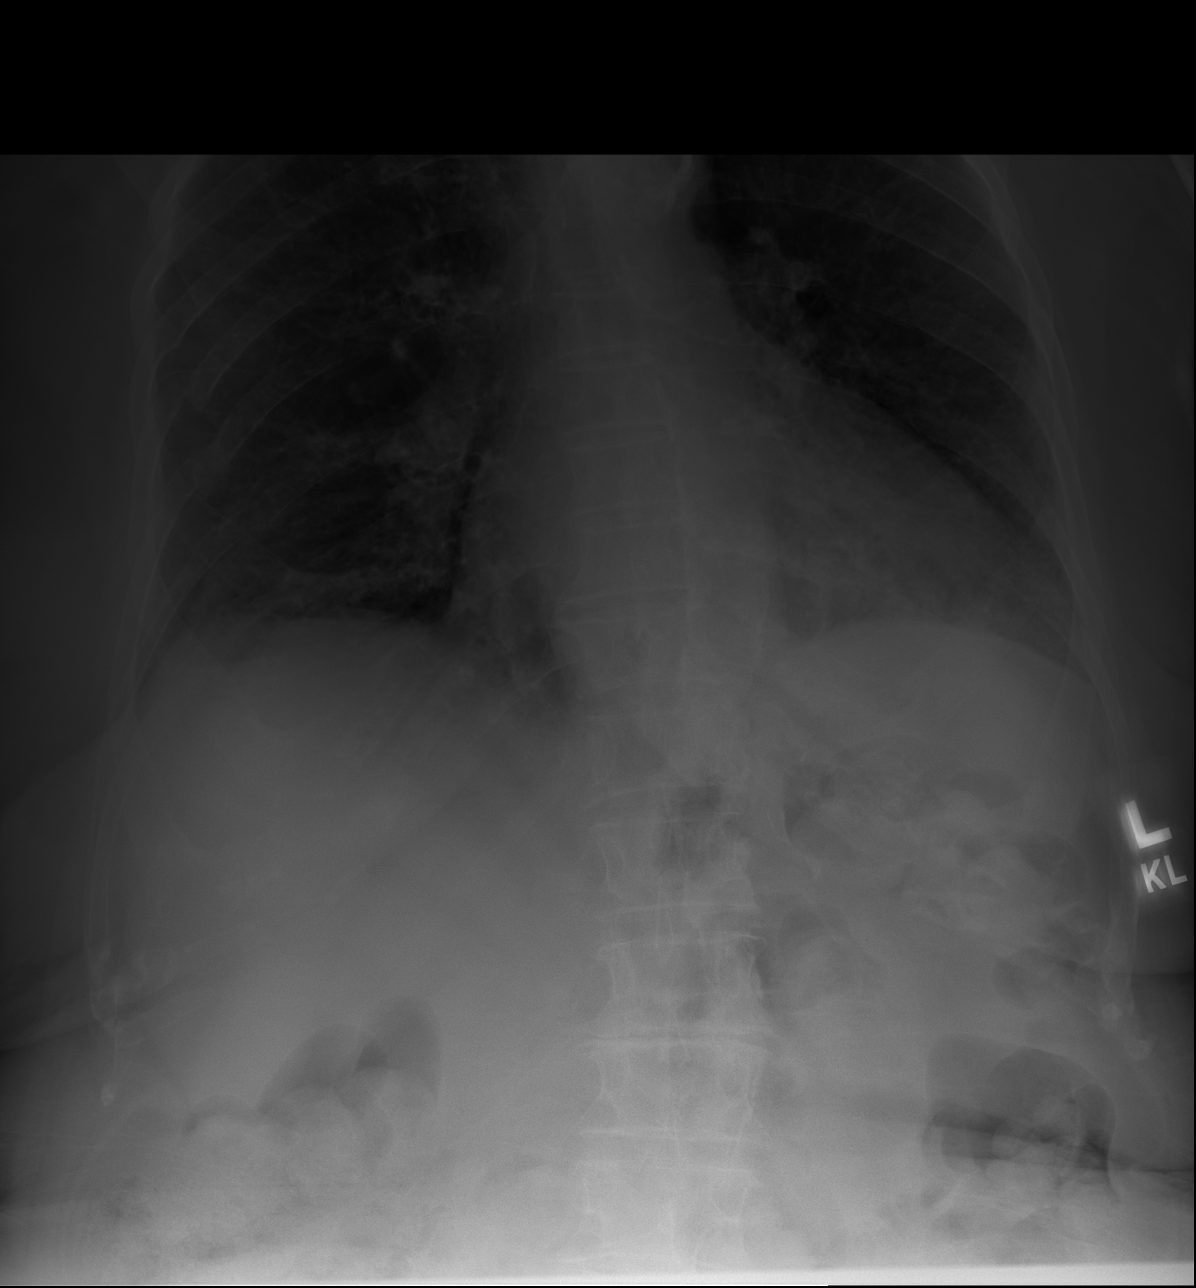

[x abdomen supine (2 of 2)]
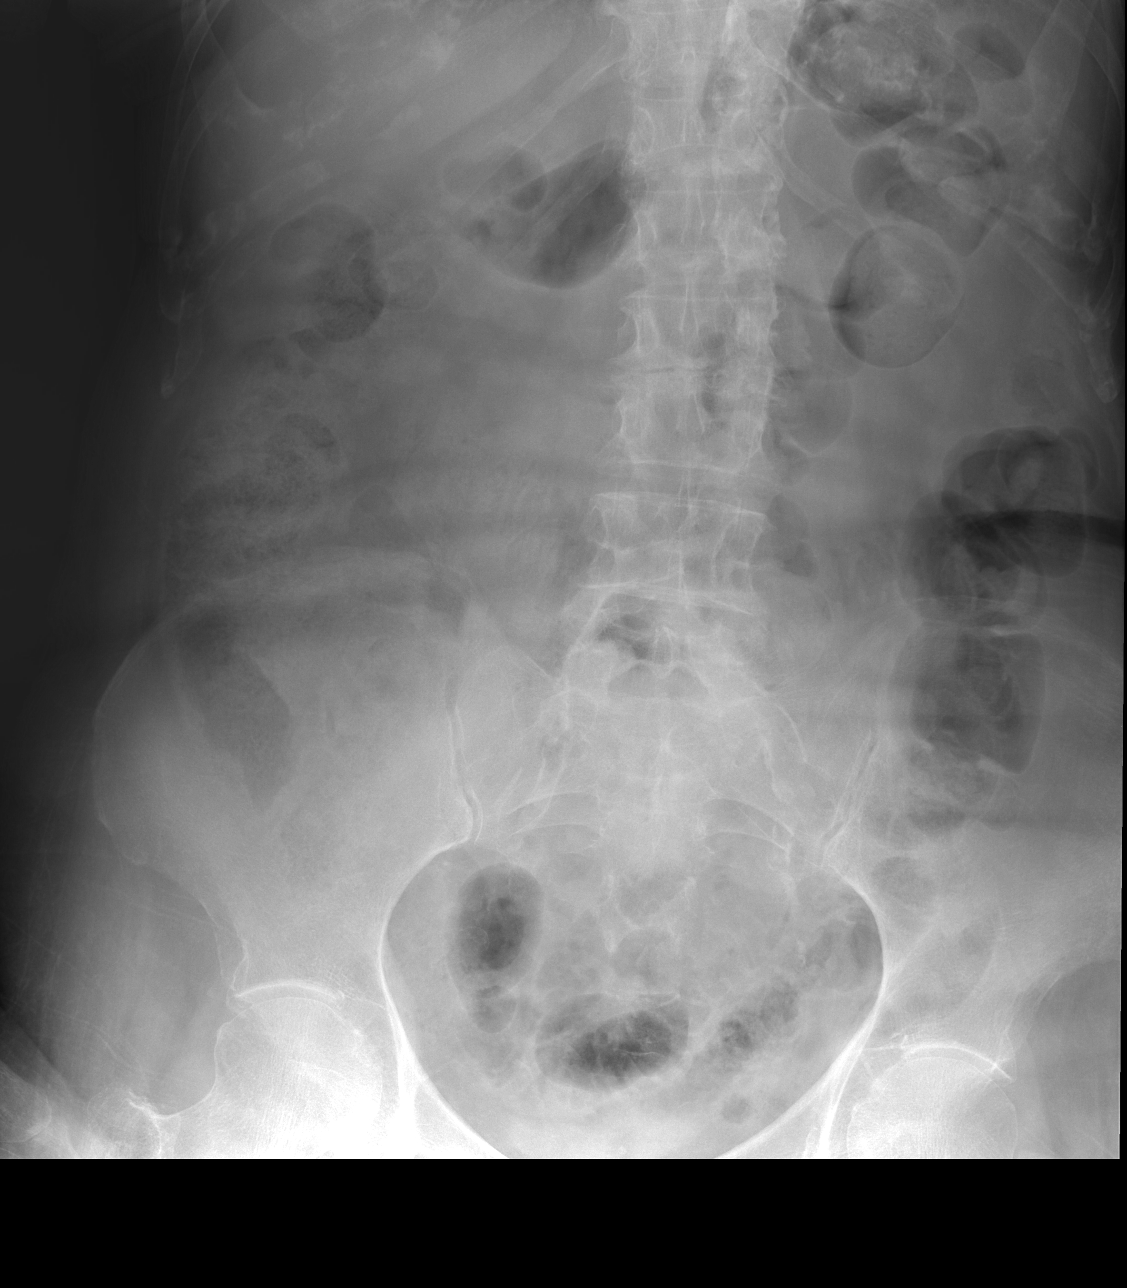

[3 of 3 positions shown; findings below may reference images not displayed]

FINDINGS: There is no evidence of dilated bowel loops or free intraperitoneal
air. No radiopaque calculi or other significant radiographic
abnormality is seen. Heart size and mediastinal contours are within
normal limits. Both lungs are clear. Vascular calcification of the
aorta.
IMPRESSION: Negative abdominal radiographs.  No acute cardiopulmonary disease.

## 2019-08-17 IMAGING — CR DG CHEST 2V
2 series · 2 of 2 positions shown · non-contrast
Comparison: 01/06/2018

CLINICAL DATA: History of constipation.  Weakness.

EXAM:
CHEST - 2 VIEW

[w chest lat]
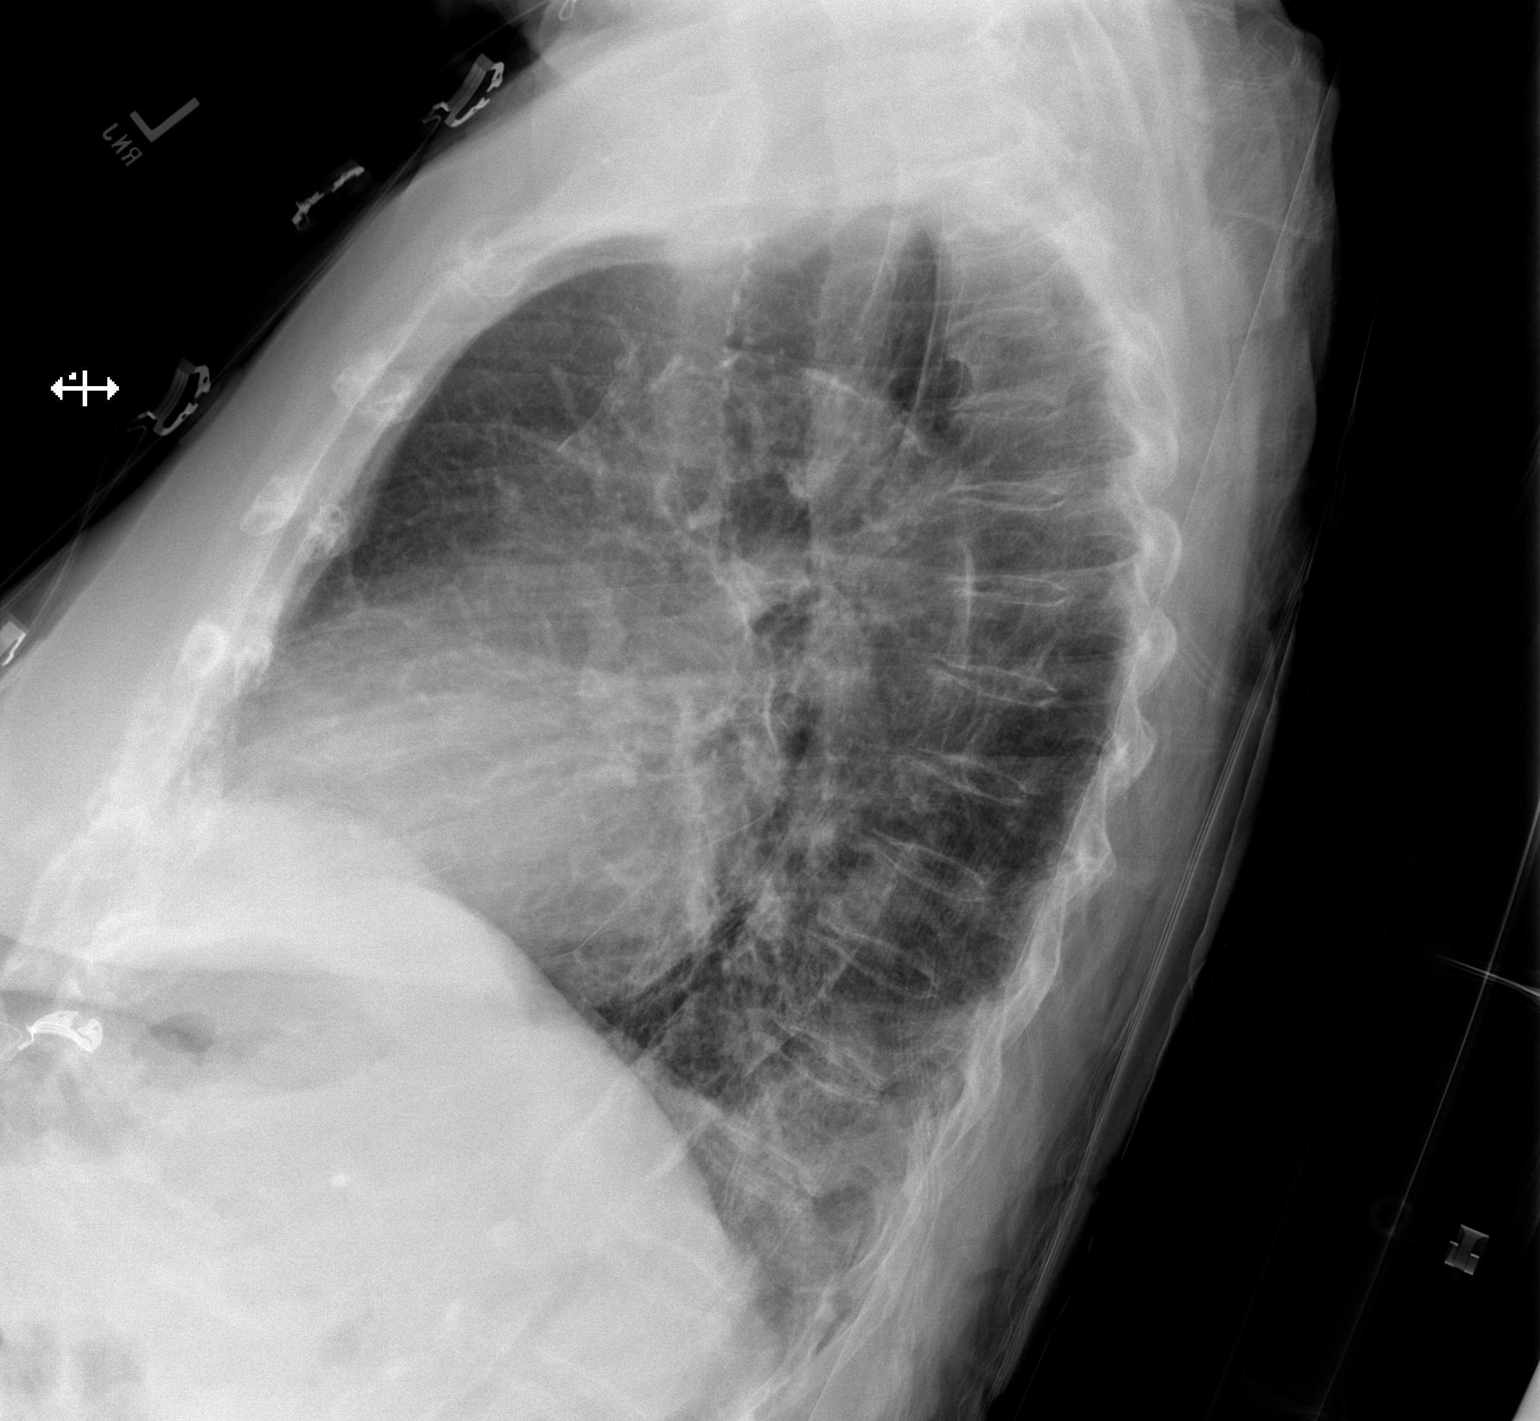

[x chest ap]
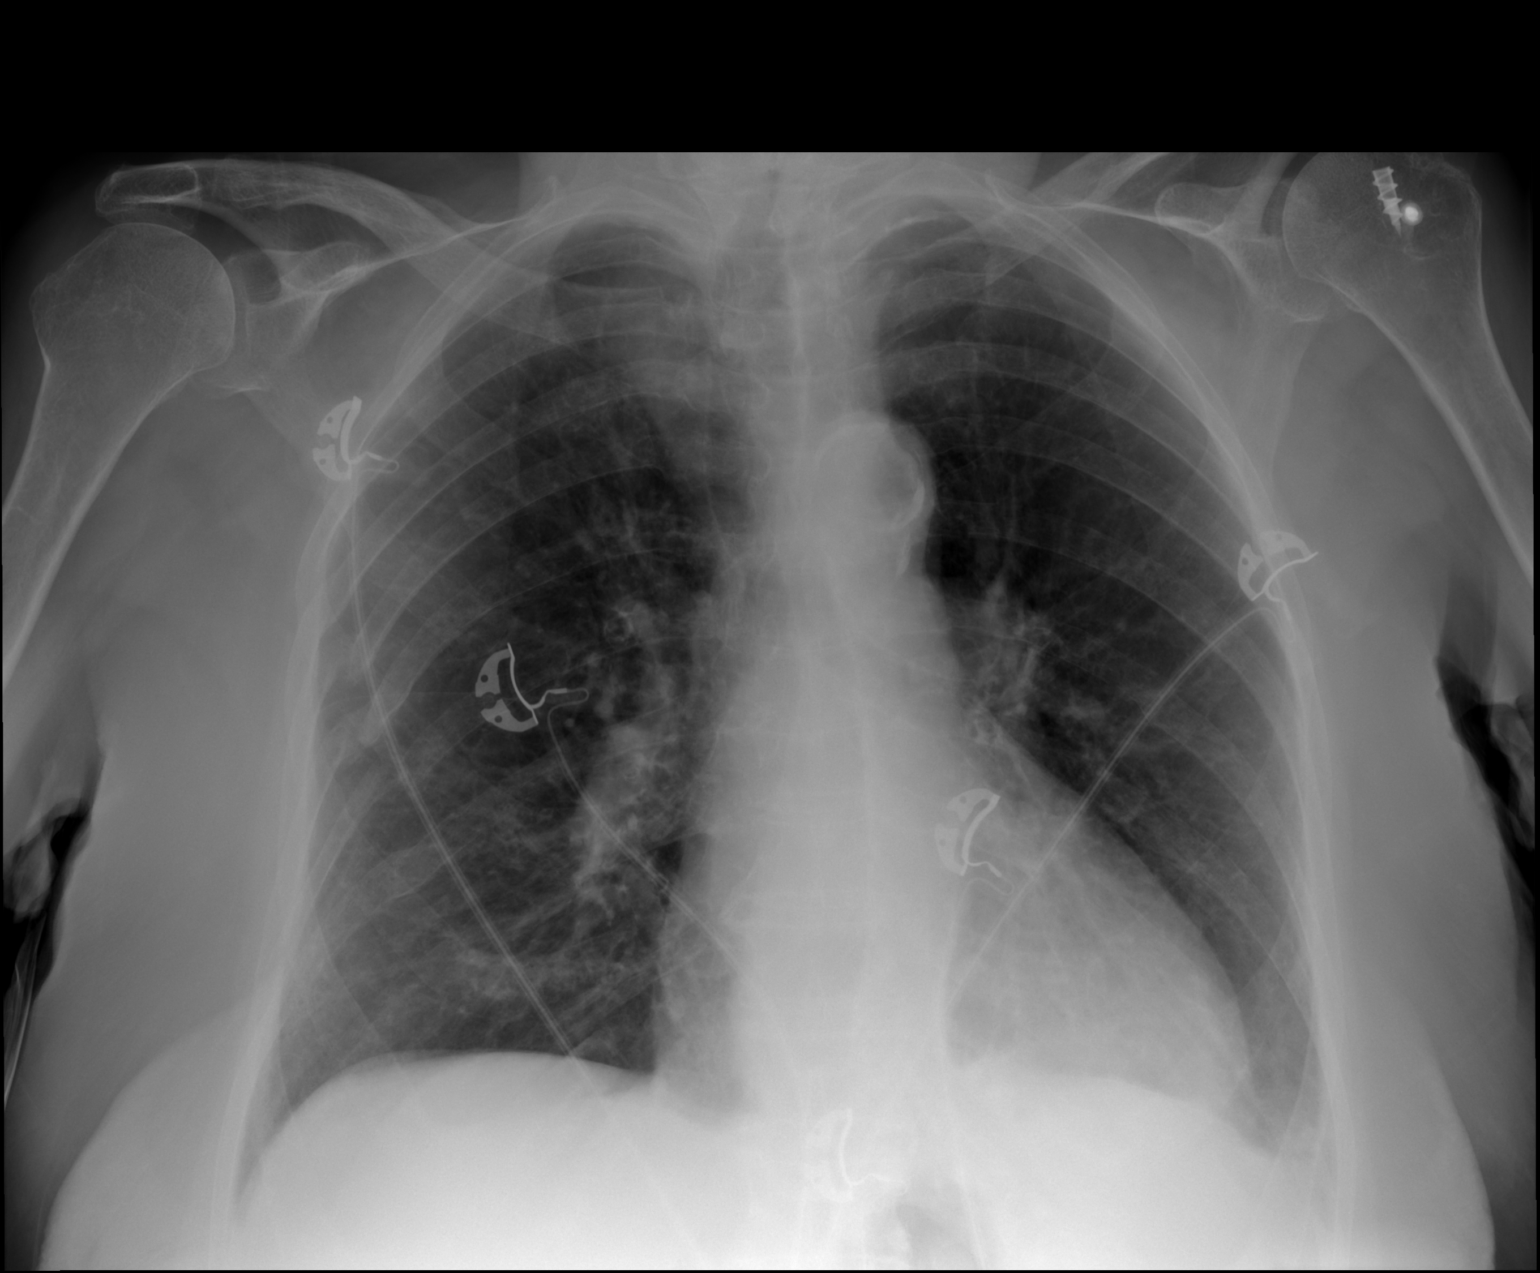

[2 of 2 positions shown; findings below may reference images not displayed]

FINDINGS: There is bilateral mild chronic interstitial thickening. There is no
focal consolidation. There is no pleural effusion or pneumothorax.
The heart size is top-normal. There is thoracic aortic
atherosclerosis.

There are multiple old healed right posterior rib fractures.
IMPRESSION: No active cardiopulmonary disease.
